# Patient Record
Sex: Female | Born: 1950 | Race: White | Hispanic: No | Marital: Single | Smoking: Never smoker
Health system: Southern US, Community
[De-identification: ages and names within clinical notes are randomized; demographics above are authoritative.]

## PROBLEM LIST (undated history)

## (undated) DIAGNOSIS — K224 Dyskinesia of esophagus: Secondary | ICD-10-CM

## (undated) DIAGNOSIS — N951 Menopausal and female climacteric states: Secondary | ICD-10-CM

## (undated) DIAGNOSIS — F329 Major depressive disorder, single episode, unspecified: Secondary | ICD-10-CM

## (undated) DIAGNOSIS — N3941 Urge incontinence: Secondary | ICD-10-CM

## (undated) DIAGNOSIS — R269 Unspecified abnormalities of gait and mobility: Secondary | ICD-10-CM

## (undated) DIAGNOSIS — F209 Schizophrenia, unspecified: Secondary | ICD-10-CM

## (undated) DIAGNOSIS — F32A Depression, unspecified: Secondary | ICD-10-CM

## (undated) DIAGNOSIS — K649 Unspecified hemorrhoids: Secondary | ICD-10-CM

## (undated) DIAGNOSIS — M76899 Other specified enthesopathies of unspecified lower limb, excluding foot: Secondary | ICD-10-CM

## (undated) DIAGNOSIS — E785 Hyperlipidemia, unspecified: Secondary | ICD-10-CM

## (undated) DIAGNOSIS — F419 Anxiety disorder, unspecified: Secondary | ICD-10-CM

## (undated) DIAGNOSIS — K219 Gastro-esophageal reflux disease without esophagitis: Secondary | ICD-10-CM

## (undated) HISTORY — DX: Menopausal and female climacteric states: N95.1

## (undated) HISTORY — DX: Gastro-esophageal reflux disease without esophagitis: K21.9

## (undated) HISTORY — DX: Unspecified abnormalities of gait and mobility: R26.9

## (undated) HISTORY — DX: Urge incontinence: N39.41

## (undated) HISTORY — DX: Anxiety disorder, unspecified: F41.9

## (undated) HISTORY — DX: Major depressive disorder, single episode, unspecified: F32.9

## (undated) HISTORY — DX: Depression, unspecified: F32.A

## (undated) HISTORY — DX: Hyperlipidemia, unspecified: E78.5

## (undated) HISTORY — DX: Other specified enthesopathies of unspecified lower limb, excluding foot: M76.899

## (undated) HISTORY — DX: Unspecified hemorrhoids: K64.9

## (undated) HISTORY — DX: Dyskinesia of esophagus: K22.4

## (undated) HISTORY — DX: Schizophrenia, unspecified: F20.9

---

## 1997-11-13 ENCOUNTER — Other Ambulatory Visit: Admission: RE | Admit: 1997-11-13 | Discharge: 1997-11-13 | Payer: Self-pay | Admitting: Gynecology

## 1998-05-09 HISTORY — PX: BREAST SURGERY: SHX581

## 1998-12-02 ENCOUNTER — Other Ambulatory Visit: Admission: RE | Admit: 1998-12-02 | Discharge: 1998-12-02 | Payer: Self-pay | Admitting: Gynecology

## 1999-01-13 ENCOUNTER — Other Ambulatory Visit: Admission: RE | Admit: 1999-01-13 | Discharge: 1999-01-13 | Payer: Self-pay | Admitting: Gynecology

## 1999-08-19 ENCOUNTER — Encounter: Payer: Self-pay | Admitting: Gynecology

## 1999-08-19 ENCOUNTER — Encounter: Admission: RE | Admit: 1999-08-19 | Discharge: 1999-08-19 | Payer: Self-pay | Admitting: Gynecology

## 1999-08-31 ENCOUNTER — Encounter: Payer: Self-pay | Admitting: Gynecology

## 1999-08-31 ENCOUNTER — Encounter: Admission: RE | Admit: 1999-08-31 | Discharge: 1999-08-31 | Payer: Self-pay | Admitting: Gynecology

## 1999-12-07 ENCOUNTER — Other Ambulatory Visit: Admission: RE | Admit: 1999-12-07 | Discharge: 1999-12-07 | Payer: Self-pay | Admitting: Gynecology

## 2000-09-28 ENCOUNTER — Encounter: Payer: Self-pay | Admitting: Gynecology

## 2000-09-28 ENCOUNTER — Encounter: Admission: RE | Admit: 2000-09-28 | Discharge: 2000-09-28 | Payer: Self-pay | Admitting: Gynecology

## 2000-10-09 ENCOUNTER — Encounter: Admission: RE | Admit: 2000-10-09 | Discharge: 2000-10-09 | Payer: Self-pay | Admitting: Gynecology

## 2000-10-09 ENCOUNTER — Encounter: Payer: Self-pay | Admitting: Gynecology

## 2000-12-07 ENCOUNTER — Other Ambulatory Visit: Admission: RE | Admit: 2000-12-07 | Discharge: 2000-12-07 | Payer: Self-pay | Admitting: Gynecology

## 2001-10-26 ENCOUNTER — Encounter: Admission: RE | Admit: 2001-10-26 | Discharge: 2001-10-26 | Payer: Self-pay | Admitting: Gynecology

## 2001-10-26 ENCOUNTER — Encounter: Payer: Self-pay | Admitting: Gynecology

## 2001-12-25 ENCOUNTER — Other Ambulatory Visit: Admission: RE | Admit: 2001-12-25 | Discharge: 2001-12-25 | Payer: Self-pay | Admitting: Gynecology

## 2002-11-29 ENCOUNTER — Encounter: Payer: Self-pay | Admitting: Gynecology

## 2002-11-29 ENCOUNTER — Encounter: Admission: RE | Admit: 2002-11-29 | Discharge: 2002-11-29 | Payer: Self-pay | Admitting: Gynecology

## 2003-01-08 ENCOUNTER — Encounter (INDEPENDENT_AMBULATORY_CARE_PROVIDER_SITE_OTHER): Payer: Self-pay | Admitting: *Deleted

## 2003-01-22 ENCOUNTER — Other Ambulatory Visit: Admission: RE | Admit: 2003-01-22 | Discharge: 2003-01-22 | Payer: Self-pay | Admitting: Gynecology

## 2003-11-27 ENCOUNTER — Encounter: Admission: RE | Admit: 2003-11-27 | Discharge: 2003-11-27 | Payer: Self-pay | Admitting: Family Medicine

## 2003-12-03 ENCOUNTER — Encounter: Admission: RE | Admit: 2003-12-03 | Discharge: 2003-12-03 | Payer: Self-pay | Admitting: Family Medicine

## 2003-12-12 ENCOUNTER — Encounter: Admission: RE | Admit: 2003-12-12 | Discharge: 2003-12-12 | Payer: Self-pay | Admitting: Gynecology

## 2003-12-17 ENCOUNTER — Encounter: Admission: RE | Admit: 2003-12-17 | Discharge: 2003-12-17 | Payer: Self-pay | Admitting: Sports Medicine

## 2004-02-25 ENCOUNTER — Ambulatory Visit: Payer: Self-pay | Admitting: Family Medicine

## 2004-03-23 ENCOUNTER — Ambulatory Visit: Payer: Self-pay | Admitting: Family Medicine

## 2004-05-18 ENCOUNTER — Emergency Department (HOSPITAL_COMMUNITY): Admission: EM | Admit: 2004-05-18 | Discharge: 2004-05-18 | Payer: Self-pay | Admitting: *Deleted

## 2004-11-30 ENCOUNTER — Ambulatory Visit: Payer: Self-pay | Admitting: Sports Medicine

## 2004-12-10 ENCOUNTER — Ambulatory Visit: Payer: Self-pay | Admitting: Family Medicine

## 2005-01-03 ENCOUNTER — Ambulatory Visit: Payer: Self-pay | Admitting: Internal Medicine

## 2005-01-17 ENCOUNTER — Ambulatory Visit: Payer: Self-pay | Admitting: Internal Medicine

## 2005-01-17 LAB — HM COLONOSCOPY

## 2005-01-25 ENCOUNTER — Ambulatory Visit: Payer: Self-pay | Admitting: Sports Medicine

## 2005-01-28 ENCOUNTER — Encounter: Admission: RE | Admit: 2005-01-28 | Discharge: 2005-01-28 | Payer: Self-pay | Admitting: Gynecology

## 2005-03-02 ENCOUNTER — Other Ambulatory Visit: Admission: RE | Admit: 2005-03-02 | Discharge: 2005-03-02 | Payer: Self-pay | Admitting: Gynecology

## 2005-03-11 ENCOUNTER — Ambulatory Visit: Payer: Self-pay | Admitting: Family Medicine

## 2005-03-24 ENCOUNTER — Ambulatory Visit: Payer: Self-pay | Admitting: Family Medicine

## 2005-04-22 ENCOUNTER — Ambulatory Visit: Payer: Self-pay | Admitting: Family Medicine

## 2005-05-12 ENCOUNTER — Encounter: Admission: RE | Admit: 2005-05-12 | Discharge: 2005-05-12 | Payer: Self-pay | Admitting: Family Medicine

## 2005-08-29 ENCOUNTER — Ambulatory Visit: Payer: Self-pay | Admitting: Family Medicine

## 2005-12-26 ENCOUNTER — Ambulatory Visit: Payer: Self-pay | Admitting: Family Medicine

## 2005-12-28 ENCOUNTER — Ambulatory Visit: Payer: Self-pay | Admitting: Family Medicine

## 2006-01-04 ENCOUNTER — Ambulatory Visit: Payer: Self-pay | Admitting: Family Medicine

## 2006-02-15 ENCOUNTER — Encounter: Admission: RE | Admit: 2006-02-15 | Discharge: 2006-02-15 | Payer: Self-pay | Admitting: Gynecology

## 2006-07-06 DIAGNOSIS — F209 Schizophrenia, unspecified: Secondary | ICD-10-CM | POA: Insufficient documentation

## 2006-07-06 DIAGNOSIS — N951 Menopausal and female climacteric states: Secondary | ICD-10-CM

## 2006-07-06 DIAGNOSIS — K649 Unspecified hemorrhoids: Secondary | ICD-10-CM | POA: Insufficient documentation

## 2006-07-07 ENCOUNTER — Encounter (INDEPENDENT_AMBULATORY_CARE_PROVIDER_SITE_OTHER): Payer: Self-pay | Admitting: *Deleted

## 2006-08-14 ENCOUNTER — Ambulatory Visit (HOSPITAL_COMMUNITY): Admission: RE | Admit: 2006-08-14 | Discharge: 2006-08-14 | Payer: Self-pay | Admitting: Family Medicine

## 2006-08-14 ENCOUNTER — Ambulatory Visit: Payer: Self-pay | Admitting: Sports Medicine

## 2006-08-22 ENCOUNTER — Ambulatory Visit: Payer: Self-pay | Admitting: Family Medicine

## 2006-08-22 ENCOUNTER — Encounter (INDEPENDENT_AMBULATORY_CARE_PROVIDER_SITE_OTHER): Payer: Self-pay | Admitting: Family Medicine

## 2006-08-22 LAB — CONVERTED CEMR LAB
CO2: 26 meq/L (ref 19–32)
Chloride: 104 meq/L (ref 96–112)
Creatinine, Ser: 1.04 mg/dL (ref 0.40–1.20)
Glucose, Fasting: 93 mg/dL (ref 70–99)
Potassium: 4.2 meq/L (ref 3.5–5.3)

## 2006-08-24 ENCOUNTER — Telehealth: Payer: Self-pay | Admitting: *Deleted

## 2006-09-13 ENCOUNTER — Ambulatory Visit: Payer: Self-pay | Admitting: Family Medicine

## 2006-09-13 DIAGNOSIS — N3941 Urge incontinence: Secondary | ICD-10-CM

## 2006-12-05 ENCOUNTER — Telehealth (INDEPENDENT_AMBULATORY_CARE_PROVIDER_SITE_OTHER): Payer: Self-pay | Admitting: Family Medicine

## 2006-12-25 ENCOUNTER — Encounter (INDEPENDENT_AMBULATORY_CARE_PROVIDER_SITE_OTHER): Payer: Self-pay | Admitting: Family Medicine

## 2007-03-19 ENCOUNTER — Encounter: Admission: RE | Admit: 2007-03-19 | Discharge: 2007-03-19 | Payer: Self-pay | Admitting: Obstetrics and Gynecology

## 2007-04-23 ENCOUNTER — Encounter (INDEPENDENT_AMBULATORY_CARE_PROVIDER_SITE_OTHER): Payer: Self-pay | Admitting: Family Medicine

## 2007-04-23 ENCOUNTER — Ambulatory Visit: Payer: Self-pay | Admitting: Family Medicine

## 2007-04-23 DIAGNOSIS — K224 Dyskinesia of esophagus: Secondary | ICD-10-CM

## 2007-04-24 ENCOUNTER — Encounter (INDEPENDENT_AMBULATORY_CARE_PROVIDER_SITE_OTHER): Payer: Self-pay | Admitting: Family Medicine

## 2007-04-24 LAB — CONVERTED CEMR LAB
ALT: 18 units/L (ref 0–35)
AST: 15 units/L (ref 0–37)
BUN: 22 mg/dL (ref 6–23)
CO2: 25 meq/L (ref 19–32)
Chloride: 105 meq/L (ref 96–112)
Creatinine, Ser: 1.13 mg/dL (ref 0.40–1.20)
Direct LDL: 104 mg/dL — ABNORMAL HIGH
Glucose, Bld: 93 mg/dL (ref 70–99)
Total Bilirubin: 0.3 mg/dL (ref 0.3–1.2)
Total Protein: 6.7 g/dL (ref 6.0–8.3)

## 2007-08-15 ENCOUNTER — Telehealth (INDEPENDENT_AMBULATORY_CARE_PROVIDER_SITE_OTHER): Payer: Self-pay | Admitting: Family Medicine

## 2007-10-16 ENCOUNTER — Telehealth: Payer: Self-pay | Admitting: *Deleted

## 2007-10-18 ENCOUNTER — Ambulatory Visit: Payer: Self-pay | Admitting: Family Medicine

## 2007-10-18 ENCOUNTER — Encounter (INDEPENDENT_AMBULATORY_CARE_PROVIDER_SITE_OTHER): Payer: Self-pay | Admitting: Family Medicine

## 2007-10-18 LAB — CONVERTED CEMR LAB
AST: 21 units/L (ref 0–37)
BUN: 15 mg/dL (ref 6–23)
CO2: 27 meq/L (ref 19–32)
Cholesterol: 178 mg/dL (ref 0–200)
Creatinine, Ser: 0.95 mg/dL (ref 0.40–1.20)
Glucose, Bld: 96 mg/dL (ref 70–99)
HDL: 58 mg/dL (ref >39)
Hemoglobin: 13.8 g/dL (ref 12.0–15.0)
MCHC: 32.9 g/dL (ref 30.0–36.0)
Platelets: 231 10*3/uL (ref 150–400)
TSH: 2.628 microintl units/mL (ref 0.350–5.50)
Total Bilirubin: 0.5 mg/dL (ref 0.3–1.2)
Triglycerides: 246 mg/dL — ABNORMAL HIGH (ref <150)
WBC: 5.6 10*3/uL (ref 4.0–10.5)

## 2007-10-24 ENCOUNTER — Ambulatory Visit: Payer: Self-pay | Admitting: Family Medicine

## 2008-02-05 ENCOUNTER — Telehealth: Payer: Self-pay | Admitting: *Deleted

## 2008-02-13 ENCOUNTER — Ambulatory Visit: Payer: Self-pay | Admitting: Family Medicine

## 2008-02-13 ENCOUNTER — Encounter (INDEPENDENT_AMBULATORY_CARE_PROVIDER_SITE_OTHER): Payer: Self-pay | Admitting: Family Medicine

## 2008-02-13 DIAGNOSIS — R269 Unspecified abnormalities of gait and mobility: Secondary | ICD-10-CM | POA: Insufficient documentation

## 2008-02-27 ENCOUNTER — Encounter: Admission: RE | Admit: 2008-02-27 | Discharge: 2008-03-28 | Payer: Self-pay | Admitting: Family Medicine

## 2008-03-26 ENCOUNTER — Encounter: Admission: RE | Admit: 2008-03-26 | Discharge: 2008-03-26 | Payer: Self-pay | Admitting: Family Medicine

## 2008-05-19 ENCOUNTER — Encounter (INDEPENDENT_AMBULATORY_CARE_PROVIDER_SITE_OTHER): Payer: Self-pay | Admitting: *Deleted

## 2008-05-19 ENCOUNTER — Ambulatory Visit: Payer: Self-pay | Admitting: Family Medicine

## 2008-05-21 ENCOUNTER — Encounter (INDEPENDENT_AMBULATORY_CARE_PROVIDER_SITE_OTHER): Payer: Self-pay | Admitting: Family Medicine

## 2008-05-21 ENCOUNTER — Ambulatory Visit: Payer: Self-pay | Admitting: Family Medicine

## 2008-05-28 ENCOUNTER — Encounter (INDEPENDENT_AMBULATORY_CARE_PROVIDER_SITE_OTHER): Payer: Self-pay | Admitting: Family Medicine

## 2008-05-28 LAB — CONVERTED CEMR LAB
ALT: 21 units/L (ref 0–35)
AST: 19 units/L (ref 0–37)
Albumin: 4.5 g/dL (ref 3.5–5.2)
BUN: 18 mg/dL (ref 6–23)
Basophils Relative: 0 % (ref 0–1)
Chloride: 101 meq/L (ref 96–112)
Eosinophils Absolute: 0 10*3/uL (ref 0.0–0.7)
Ferritin: 50 ng/mL (ref 10–291)
Folate: >20 ng/mL
Glucose, Bld: 91 mg/dL (ref 70–99)
Monocytes Absolute: 0.3 10*3/uL (ref 0.1–1.0)
Neutro Abs: 3.9 10*3/uL (ref 1.7–7.7)
Neutrophils Relative %: 72 % (ref 43–77)
Potassium: 4.2 meq/L (ref 3.5–5.3)
RBC: 4.61 M/uL (ref 3.87–5.11)
RDW: 13.8 % (ref 11.5–15.5)
TSH: 4.006 microintl units/mL (ref 0.350–4.50)
Total Bilirubin: 0.4 mg/dL (ref 0.3–1.2)
Total Protein: 6.8 g/dL (ref 6.0–8.3)
Vit D, 1,25-Dihydroxy: 48 (ref 30–89)
Vitamin B-12: 705 pg/mL (ref 211–911)

## 2008-06-24 ENCOUNTER — Encounter (INDEPENDENT_AMBULATORY_CARE_PROVIDER_SITE_OTHER): Payer: Self-pay | Admitting: Family Medicine

## 2008-07-23 ENCOUNTER — Encounter (INDEPENDENT_AMBULATORY_CARE_PROVIDER_SITE_OTHER): Payer: Self-pay | Admitting: Family Medicine

## 2008-07-23 ENCOUNTER — Ambulatory Visit: Payer: Self-pay | Admitting: Family Medicine

## 2008-07-24 ENCOUNTER — Encounter (INDEPENDENT_AMBULATORY_CARE_PROVIDER_SITE_OTHER): Payer: Self-pay | Admitting: Family Medicine

## 2008-10-15 ENCOUNTER — Ambulatory Visit: Payer: Self-pay | Admitting: Family Medicine

## 2008-10-15 DIAGNOSIS — E663 Overweight: Secondary | ICD-10-CM | POA: Insufficient documentation

## 2008-10-21 ENCOUNTER — Encounter (INDEPENDENT_AMBULATORY_CARE_PROVIDER_SITE_OTHER): Payer: Self-pay | Admitting: Family Medicine

## 2008-10-21 ENCOUNTER — Ambulatory Visit: Payer: Self-pay | Admitting: Family Medicine

## 2008-10-22 ENCOUNTER — Encounter: Payer: Self-pay | Admitting: Family Medicine

## 2008-10-22 LAB — CONVERTED CEMR LAB
ALT: 37 units/L — ABNORMAL HIGH (ref 0–35)
AST: 27 units/L (ref 0–37)
CO2: 25 meq/L (ref 19–32)
Chloride: 105 meq/L (ref 96–112)
Cholesterol: 218 mg/dL — ABNORMAL HIGH (ref 0–200)
Creatinine, Ser: 1.02 mg/dL (ref 0.40–1.20)
Glucose, Bld: 91 mg/dL (ref 70–99)
HDL: 50 mg/dL (ref >39)
Hemoglobin: 13.5 g/dL (ref 12.0–15.0)
MCHC: 32 g/dL (ref 30.0–36.0)
Platelets: 203 10*3/uL (ref 150–400)
Total Bilirubin: 0.5 mg/dL (ref 0.3–1.2)
Triglycerides: 307 mg/dL — ABNORMAL HIGH (ref <150)
VLDL: 61 mg/dL — ABNORMAL HIGH (ref 0–40)

## 2008-10-28 ENCOUNTER — Telehealth: Payer: Self-pay | Admitting: Family Medicine

## 2008-10-29 ENCOUNTER — Ambulatory Visit: Payer: Self-pay | Admitting: Family Medicine

## 2008-10-29 DIAGNOSIS — H6120 Impacted cerumen, unspecified ear: Secondary | ICD-10-CM

## 2008-12-01 ENCOUNTER — Telehealth: Payer: Self-pay | Admitting: *Deleted

## 2008-12-04 ENCOUNTER — Encounter: Payer: Self-pay | Admitting: Family Medicine

## 2009-01-20 ENCOUNTER — Ambulatory Visit: Payer: Self-pay | Admitting: Internal Medicine

## 2009-01-20 DIAGNOSIS — K219 Gastro-esophageal reflux disease without esophagitis: Secondary | ICD-10-CM

## 2009-01-20 DIAGNOSIS — M79609 Pain in unspecified limb: Secondary | ICD-10-CM

## 2009-01-20 DIAGNOSIS — E785 Hyperlipidemia, unspecified: Secondary | ICD-10-CM

## 2009-03-30 ENCOUNTER — Encounter: Admission: RE | Admit: 2009-03-30 | Discharge: 2009-03-30 | Payer: Self-pay | Admitting: Obstetrics and Gynecology

## 2009-05-27 ENCOUNTER — Ambulatory Visit: Payer: Self-pay | Admitting: Internal Medicine

## 2009-05-27 LAB — CONVERTED CEMR LAB
Direct LDL: 83.4 mg/dL
Total Bilirubin: 0.7 mg/dL (ref 0.3–1.2)
Total Protein: 6.5 g/dL (ref 6.0–8.3)

## 2009-07-14 ENCOUNTER — Telehealth: Payer: Self-pay | Admitting: Internal Medicine

## 2009-08-06 ENCOUNTER — Telehealth: Payer: Self-pay | Admitting: Internal Medicine

## 2009-12-14 ENCOUNTER — Encounter (INDEPENDENT_AMBULATORY_CARE_PROVIDER_SITE_OTHER): Payer: Self-pay | Admitting: *Deleted

## 2009-12-22 ENCOUNTER — Ambulatory Visit: Payer: Self-pay | Admitting: Internal Medicine

## 2009-12-22 LAB — CONVERTED CEMR LAB
ALT: 29 units/L (ref 0–35)
Albumin: 4.1 g/dL (ref 3.5–5.2)
Alkaline Phosphatase: 93 units/L (ref 39–117)
Cholesterol: 164 mg/dL (ref 0–200)
Direct LDL: 84.9 mg/dL
Total Bilirubin: 0.5 mg/dL (ref 0.3–1.2)
Total CHOL/HDL Ratio: 3
Triglycerides: 212 mg/dL — ABNORMAL HIGH (ref 0.0–149.0)

## 2009-12-23 ENCOUNTER — Encounter (INDEPENDENT_AMBULATORY_CARE_PROVIDER_SITE_OTHER): Payer: Self-pay | Admitting: *Deleted

## 2010-02-26 ENCOUNTER — Encounter (INDEPENDENT_AMBULATORY_CARE_PROVIDER_SITE_OTHER): Payer: Self-pay

## 2010-03-02 ENCOUNTER — Ambulatory Visit: Payer: Self-pay | Admitting: Internal Medicine

## 2010-03-16 ENCOUNTER — Ambulatory Visit: Payer: Self-pay | Admitting: Internal Medicine

## 2010-03-16 HISTORY — PX: COLONOSCOPY: SHX174

## 2010-03-20 ENCOUNTER — Encounter: Payer: Self-pay | Admitting: Internal Medicine

## 2010-04-13 ENCOUNTER — Encounter: Admission: RE | Admit: 2010-04-13 | Discharge: 2010-04-13 | Payer: Self-pay | Admitting: Obstetrics and Gynecology

## 2010-06-10 NOTE — Progress Notes (Signed)
Summary: simvastatin  Phone Note Refill Request Message from:  Fax from Pharmacy on August 06, 2009 2:20 PM  Refills Requested: Medication #1:  SIMVASTATIN 40 MG TABS 1 by mouth at bedtime   Last Refilled: 06/29/2009  Method Requested: Electronic Initial call taken by: Orlan Leavens,  August 06, 2009 2:20 PM    Prescriptions: SIMVASTATIN 40 MG TABS (SIMVASTATIN) 1 by mouth at bedtime  #30 x 5   Entered by:   Orlan Leavens   Authorized by:   Newt Lukes MD   Signed by:   Orlan Leavens on 08/06/2009   Method used:   Electronically to        [facility] (retail)       [address]       [place], [place]  [address]       Ph: [phone] or [phone]       Fax: [phone]   RxID:   [id number]

## 2010-06-10 NOTE — Progress Notes (Signed)
Summary: cartia  Phone Note Refill Request Message from:  Fax from Pharmacy on July 14, 2009 8:56 AM  Refills Requested: Medication #1:  DILTIAZEM HCL COATED BEADS 120 MG CP24 1 capsule by mouth daily   Last Refilled: 06/04/2009  Method Requested: Electronic Initial call taken by: Orlan Leavens,  July 14, 2009 8:57 AM    Prescriptions: DILTIAZEM HCL COATED BEADS 120 MG CP24 (DILTIAZEM HCL COATED BEADS) 1 capsule by mouth daily  #30 Capsule x 5   Entered by:   Orlan Leavens   Authorized by:   Newt Lukes MD   Signed by:   Orlan Leavens on 07/14/2009   Method used:   Electronically to        [facility] (retail)       [address]       [place], [place]  [address]       Ph: [phone] or [phone]       Fax: [phone]   RxID:   [id number]

## 2010-06-10 NOTE — Letter (Signed)
Summary: Patient Notice- Polyp Results  [facility]  [address] [place], [place] [address]   Phone: [phone]  Fax: [phone]        March 20, 2010 MRN: [id number]    Nancy Brooks [address] [place], [place]  [address]    Dear Ms. Shaff,  I am pleased to inform you that the colon polyp(s) removed during your recent colonoscopy was (were) found to be benign (no cancer detected) upon pathologic examination.The polyps did not contain any precancerous tissue  I recommend you have a repeat colonoscopy examination in 5_ years to look for recurrent polyps, as having colon polyps increases your risk for having recurrent polyps or even colon cancer in the future.  Should you develop new or worsening symptoms of abdominal pain, bowel habit changes or bleeding from the rectum or bowels, please schedule an evaluation with either your primary care physician or with me.  Additional information/recommendations:  __ No further action with gastroenterology is needed at this time. Please      follow-up with your primary care physician for your other healthcare      needs.  __ Please call [phone] to schedule a return visit to review your      situation.  __ Please keep your follow-up visit as already scheduled.  __ Continue treatment plan as outlined the day of your exam.  Please call us if you are having persistent problems or have questions about your condition that have not been fully answered at this time.  Sincerely,  Hart Carwin MD  This letter has been electronically signed by your physician.  Appended Document: Patient Notice- Polyp Results Letter mailed

## 2010-06-10 NOTE — Procedures (Signed)
Summary: Colonoscopy  Patient: Nancy Brooks Note: All result statuses are Final unless otherwise noted.  Tests: (1) Colonoscopy (COL)   COL Colonoscopy           DONE     [facility]     [address]     [place], [place]  [address]           COLONOSCOPY PROCEDURE REPORT           PATIENT:  Nancy Brooks, Nancy Brooks  MR#:  [id number]     BIRTHDATE:  07-09-1950, 59 yrs. old  GENDER:  female     ENDOSCOPIST:  Hedwig Morton. Juanda Chance, MD     REF. BY:     PROCEDURE DATE:  03/16/2010     PROCEDURE:  Colonoscopy 13086     ASA CLASS:  Class I     INDICATIONS:  family history of colon cancer brother with colon     cancer     last colon 2006     MEDICATIONS:   Versed 9 mg, Fentanyl 75 mcg           DESCRIPTION OF PROCEDURE:   After the risks benefits and     alternatives of the procedure were thoroughly explained, informed     consent was obtained.  Digital rectal exam was performed and     revealed no rectal masses.   The LB [id number]-[id number] endoscope     was introduced through the anus and advanced to the cecum, which     was identified by both the appendix and ileocecal valve, without     limitations.  The quality of the prep was good, using MiraLax.     The instrument was then slowly withdrawn as the colon was fully     examined.     <<PROCEDUREIMAGES>>           FINDINGS:  Two polyps were found. at 90 cm flat 5 mm polyp, at     20cm 8 mm flat polyp The polyp was removed using cold biopsy     forceps. Polyp was snared without cautery. Retrieval was     unsuccessful (see image1 and image2). snare polyp  This was     otherwise a normal examination of the colon (see image6, image4,     and image3).  Scattered diverticula were found in the sigmoid     colon (see image5).   Retroflexed views in the rectum revealed no     abnormalities.    The scope was then withdrawn from the patient     and the procedure completed.           COMPLICATIONS:  None     ENDOSCOPIC IMPRESSION:     1) Two  polyps     2) Otherwise normal examination     3) Diverticula, scattered in the sigmoid colon     RECOMMENDATIONS:     1) Await pathology results     2) High fiber diet.     REPEAT EXAM:  In 5 year(s) for.           ______________________________     Hedwig Morton. Juanda Chance, MD           CC:  Newt Lukes, MD           n.     Rosalie DoctorHedwig Morton. Brodie at 03/16/2010 02:41 PM           Bea Laura, [phone]  Note:  An exclamation mark (!) indicates a result that was not dispersed into the flowsheet. Document Creation Date: 03/16/2010 2:41 PM _______________________________________________________________________  (1) Order result status: Final Collection or observation date-time: 03/16/2010 14:34 Requested date-time:  Receipt date-time:  Reported date-time:  Referring Physician:   Ordering Physician: Lina Sar [phone]) Specimen Source:  Source: Launa Grill Order Number: [id number] Lab site:   Appended Document: Colonoscopy     Procedures Next Due Date:    Colonoscopy: 03/2015

## 2010-06-10 NOTE — Letter (Signed)
Summary: Colonoscopy Letter  [facility]  [address] [place], [place] [address]   Phone: [phone]  Fax: [phone]      December 14, 2009 MRN: [id number]   Nancy Brooks [address] [place], [place]  [address]   Dear Ms. Sparrow,   According to your medical record, it is time for you to schedule a Colonoscopy. The American Cancer Society recommends this procedure as a method to detect early colon cancer. Patients with a family history of colon cancer, or a personal history of colon polyps or inflammatory bowel disease are at increased risk.  This letter has been generated based on the recommendations made at the time of your procedure. If you feel that in your particular situation this may no longer apply, please contact our office.  Please call our office at [phone] to schedule this appointment or to update your records at your earliest convenience.  Thank you for cooperating with [place] to provide you with the very best care possible.   Sincerely,  Hedwig Morton. Juanda Chance, M.D.  [facility] Gastroenterology Division [phone]

## 2010-06-10 NOTE — Assessment & Plan Note (Signed)
Summary: 6 mos f/u #/cd   Vital Signs:  Patient profile:   60 year old female Height:      60.5 inches (153.67 cm) Weight:      148.8 pounds (67.64 kg) BMI:     28.69 O2 Sat:      98 % on Room air Temp:     98.2 degrees F (36.78 degrees C) oral Pulse rate:   115 / minute BP sitting:   120 / 78  (left arm) Cuff size:   regular  Vitals Entered By: Orlan Leavens RMA (December 22, 2009 11:08 AM)  O2 Flow:  Room air CC: 6 month follow-up Is Patient Diabetic? No Pain Assessment Patient in pain? no        Primary Care Arriana Lohmann:  Newt Lukes MD  CC:  6 month follow-up.  History of Present Illness: here for followup here with her sister today, [name]  schizophrenia - follows with psyc - dr. Jennelle Human visits q 6-8 weeks for same symptoms have been well-controlled.  No recent mood changes. No hallucinations.  No recent med changes  GERD with hx esoph spasm   on Dilt for same since 05/2005 no daily reflux or burning symptoms  dyslipidemia. Has been on increased dose of simvastatin since 01/2009 No GI or muscle complaints at current dose also takes fish oil capsules  Obesity. Patient reports she is working on diet to lose weight, stll exercising She is uncertain of recent weight changes.    Clinical Review Panels:  Prevention   Last Mammogram:  ASSESSMENT: Negative - BI-RADS 1^MM DIGITAL SCREENING (03/26/2008)   Last Pap Smear:  normal (02/07/2007)   Last Colonoscopy:  Results: Hemorrhoids,internal Comments: Poor prep prevented detection of small polyps, but no gross lesion     Location:  [facility].  (01/17/2005)  Lipid Management   Cholesterol:  165 (05/27/2009)   LDL (bad choesterol):  107 (10/21/2008)   HDL (good cholesterol):  55.30 (05/27/2009)  CBC   WBC:  5.6 (10/21/2008)   RBC:  4.69 (10/21/2008)   Hgb:  13.5 (10/21/2008)   Hct:  42.2 (10/21/2008)   Platelets:  203 (10/21/2008)   MCV  90.0 (10/21/2008)   MCHC  32.0 (10/21/2008)   RDW   14.1 (10/21/2008)   PMN:  72 (05/21/2008)   Lymphs:  22 (05/21/2008)   Monos:  6 (05/21/2008)   Eosinophils:  0 (05/21/2008)   Basophil:  0 (05/21/2008)  Complete Metabolic Panel   Glucose:  91 (10/21/2008)   Sodium:  143 (10/21/2008)   Potassium:  4.2 (10/21/2008)   Chloride:  105 (10/21/2008)   CO2:  25 (10/21/2008)   BUN:  16 (10/21/2008)   Creatinine:  1.02 (10/21/2008)   Albumin:  4.1 (05/27/2009)   Total Protein:  6.5 (05/27/2009)   Calcium:  10.0 (10/21/2008)   Total Bili:  0.7 (05/27/2009)   Alk Phos:  102 (05/27/2009)   SGPT (ALT):  31 (05/27/2009)   SGOT (AST):  27 (05/27/2009)   Current Medications (verified): 1)  Simvastatin 40 Mg Tabs (Simvastatin) .[name].. 1 By Mouth At Bedtime 2)  Diltiazem Hcl Coated Beads 120 Mg Cp24 (Diltiazem Hcl Coated Beads) .[name].. 1 Capsule By Mouth Daily 3)  Clozapine 100 Mg  Tabs (Clozapine) .... Per Psychiatrist - Dose Unknown 4)  Librium 10 Mg  Caps (Chlordiazepoxide Hcl) .... Per Psychiatrist - Dose Unknown 5)  Tums Ultra 1000 1000 Mg  Chew (Calcium Carbonate Antacid) 6)  Multivitamins   Tabs (Multiple Vitamin) .[name].. 1 By  Mouth Daily 7)  Miralax   Powd (Polyethylene Glycol 3350) .... As Needed 8)  Adult Aspirin Low Strength 81 Mg  Tbdp (Aspirin) 9)  Fish Oil 1000 Mg Caps (Omega-3 Fatty Acids) .... Take 3 By Mouth Qd  Allergies (verified): No Known Drug Allergies  Past History:  Past Medical History: Esophageal dysmotility (April 2007-barium swallow) GERD Menopausal since  ~ 60 yo Schizophrenia - well-controlled and stable on Clozapine and Librium for years.   Hyperlipidemia  Review of Systems  The patient denies fever, weight gain, chest pain, syncope, and headaches.    Physical Exam  General:  alert, well-developed, well-nourished, and cooperative to examination.   sister [name] at side Lungs:  normal respiratory effort, no intercostal retractions or use of accessory muscles; normal breath sounds bilaterally - no crackles and  no wheezes.    Heart:  normal rate, regular rhythm, no murmur, and no rub. BLE without edema. Abdomen:  obese, soft, non-tender, normal bowel sounds, no distention; no masses and no appreciable hepatomegaly or splenomegaly.   Psych:  Mildly anxious.  Requires some simplification of responses and repetition at times, but fairly stable and able to communicate effectively. Oriented X3, memory intact for recent and remote, not depressed appearing, and not agitated.     Impression & Recommendations:  Problem # 1:  HYPERLIPIDEMIA (ICD-272.4)  Her updated medication list for this problem includes:    Simvastatin 40 Mg Tabs (Simvastatin) .[name]... 1 by mouth at bedtime  Orders: TLB-Lipid Panel (80061-LIPID)  check FLP and LFTs today - began higher dose statin 01/2009,  will consider change to prava or other statin if still not controlled  Labs Reviewed: SGOT: 27 (05/27/2009)   SGPT: 31 (05/27/2009)   HDL:55.30 (05/27/2009), 50 (10/21/2008)  LDL:107 (10/21/2008), 71 (36/64/4034)  Chol:165 (05/27/2009), 218 (10/21/2008)  Trig:248.0 (05/27/2009), 307 (10/21/2008)  Problem # 2:  SCHIZOPHRENIA (ICD-295.90)  chronic symptoms are well-controlled. Continue current medications and psych followup with Dr. 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 Continue current  treatment  Labs Reviewed: Hgb: 13.5 (10/21/2008)   Hct: 42.2 (10/21/2008)  Complete Medication List: 1)  Simvastatin 40 Mg Tabs (Simvastatin) .[name].. 1 by mouth at bedtime 2)  Diltiazem Hcl Coated Beads 120 Mg Cp24 (Diltiazem hcl coated beads) .[name].. 1 capsule by mouth daily 3)  Clozapine 100 Mg Tabs (Clozapine) .... Per psychiatrist - dose unknown 4)  Librium 10 Mg Caps (Chlordiazepoxide hcl) .... Per psychiatrist - dose unknown 5)  Tums Ultra 1000 1000 Mg Chew (Calcium carbonate antacid) 6)  Multivitamins Tabs (Multiple vitamin) .[name].. 1 by mouth daily 7)  Miralax Powd (Polyethylene glycol 3350) .... As needed 8)  Adult Aspirin Low Strength 81 Mg Tbdp (Aspirin) 9)  Fish Oil 1000 Mg Caps (Omega-3 fatty acids) .... Take 3 by mouth qd  Other Orders: TLB-Hepatic/Liver Function  Pnl (80076-HEPATIC)  Patient Instructions: 1)  it was good to see you today.  2)  test(s) ordered today - your results will be posted on the phone tree for review in 48-72 hours from the time of test completion; call [phone] and enter your 9 digit MRN (listed above on this page, just below your name); if any changes need to be made or there are abnormal results, you will be contacted directly (at [phone] janet's cell).  3)  good job on the weight loss! keep up the good work! 4)  Please schedule a follow-up appointment in 6 months, sooner if problems.

## 2010-06-10 NOTE — Letter (Signed)
Summary: Pre Visit Letter Revised  [facility]  [address] [place], [place] [address]   Phone: [phone]  Fax: [phone]    12/23/2009 MRN: [id number]    [facility] [address] [place], [place]  [address]                 Procedure Date: February 25, 2010   Welcome to the Gastroenterology Division at [facility].    You are scheduled to see a nurse for your pre-procedure visit on February 11, 2010 at 11:00am on the 3rd floor at [facility], [address]  We ask that you try to arrive at our office 15 minutes prior to your appointment time to allow for check-in.  Please take a minute to review the attached form.  If you answer "Yes" to one or more of the questions on the first page, we ask that you call the person listed at your earliest opportunity.  If you answer "No" to all of the questions, please complete the rest of the form and bring it to your appointment.    Your nurse visit will consist of discussing your medical and surgical history, your immediate family medical history, and your medications.    If you are unable to list all of your medications on the form, please bring the medication bottles to your appointment and we will list them.  We will need to be aware of both prescribed and over the counter drugs.  We will need to know exact dosage information as well.    Please be prepared to read and sign documents such as consent forms, a financial agreement, and acknowledgement forms.  If necessary, and with your consent, a friend or relative is welcome to sit-in on the nurse visit with you.  Please bring your insurance card so that we may make a copy of it.  If your insurance requires a referral to see a specialist, please bring your referral form from your primary care physician.  No co-pay is required for this nurse visit.     If you cannot keep your appointment, please call [phone] to cancel or reschedule prior to your  appointment date.  This allows [place] the opportunity to schedule an appointment for another patient in need of care.   Thank you for choosing [facility] for your medical needs.  We appreciate the opportunity to care for you.  Please visit [place] at our website  to learn more about our practice.                     Sincerely,  The Gastroenterology Division

## 2010-06-10 NOTE — Miscellaneous (Signed)
Summary: Lec previsit  Clinical Lists Changes  Observations: Added new observation of NKA: T (03/02/2010 14:01)  Appended Document: Lec previsit Patient came into the office today (with her sister)  for the previsit prior to her colonoscopy on 03/16/10. The sister was asking about a different prep. The records showed the pt had a poor bowel prep with her last colonoscopy on 01/17/05. Do you want the pt to take the Moviprep or Miralax or does she need a 2 day prep? Please advise. Thanks.  Appended Document: Lec previsit 2 day prep, please.  Appended Document: Lec previsit Dr. Juanda Chance, Could you please specify what you would like the patient to do for a 2 day prep. Thanks.   Appended Document: Lec previsit firtst day full liquids and bottle of Mag citragte, second day Movie prep. ( clear liquids)  Appended Document: Lec previsit Talked with pt's sister; gave instructions for Magnesium citrate and liquids 2 days before procedure.  already had instructions for moviprep day before procedure.  Called in Rx for Moviprep to [facility] on [address].

## 2010-06-10 NOTE — Assessment & Plan Note (Signed)
Summary: 4 MO ROV / # Natale Milch   Vital Signs:  Patient profile:   60 year old female Height:      60.5 inches (153.67 cm) Weight:      152.0 pounds (69.09 kg) O2 Sat:      94 % on Room air Temp:     97.2 degrees F (36.22 degrees C) oral Pulse rate:   101 / minute BP sitting:   124 / 82  (left arm) Cuff size:   regular  Vitals Entered By: Orlan Leavens (May 27, 2009 11:01 AM)  O2 Flow:  Room air CC: 4 month follow-up Is Patient Diabetic? No Pain Assessment Patient in pain? no        Primary Care Provider:  Newt Lukes MD  CC:  4 month follow-up.  History of Present Illness: here for followup here with her sister today, [name]  schizophrenia - follows with psyc - dr. Jennelle Human visits q 6-8 weeks for same symptoms have been well-controlled.  No recent mood changes. No hallucinations.  No recent med changes  GERD with hx esoph spasm   on Dilt for same since 05/2005 no daily reflux or burning symptoms  dyslipidemia. Has been on increased dose of simvastatin since last OV No GI or muscle complaints at current dose Does also take fish oil capsules  Obesity. Patient reports she is no longer working on  diet to lose weight, stll exercising She is uncertain of recent weight changes.    Current Medications (verified): 1)  Simvastatin 40 Mg Tabs (Simvastatin) .[name].. 1 By Mouth At Bedtime 2)  Diltiazem Hcl Coated Beads 120 Mg Cp24 (Diltiazem Hcl Coated Beads) .[name].. 1 Capsule By Mouth Daily 3)  Clozapine 100 Mg  Tabs (Clozapine) .... Per Psychiatrist - Dose Unknown 4)  Librium 10 Mg  Caps (Chlordiazepoxide Hcl) .... Per Psychiatrist - Dose Unknown 5)  Tums Ultra 1000 1000 Mg  Chew (Calcium Carbonate Antacid) 6)  Multivitamins   Tabs (Multiple Vitamin) .[name].. 1 By Mouth Daily 7)  Miralax   Powd (Polyethylene Glycol 3350) .... As Needed 8)  Adult Aspirin Low Strength 81 Mg  Tbdp (Aspirin) 9)  Fish Oil 1000 Mg Caps (Omega-3 Fatty Acids) .... Take 3 By Mouth Qd  Allergies  (verified): No Known Drug Allergies  Past History:  Past Medical History: Last updated: 01/20/2009 Esophageal dysmotility (April 2007-barium swallow) GERD Menopausal since  ~ 60 yo Schizophrenia - well-controlled and stable on Clozapine and Librium for years.   Hyperlipidemia  Review of Systems  The patient denies chest pain, syncope, headaches, and abdominal pain.    Physical Exam  General:  alert, well-developed, well-nourished, and cooperative to examination.   sister [name] at side Lungs:  normal respiratory effort, no intercostal retractions or use of accessory muscles; normal breath sounds bilaterally - no crackles and no wheezes.    Heart:  normal rate, regular rhythm, no murmur, and no rub. BLE without edema. normal DP pulses and normal cap refill in all 4 extremities    Abdomen:  obese, soft, non-tender, normal bowel sounds, no distention; no masses and no appreciable hepatomegaly or splenomegaly.   Psych:  Mildly anxious.  Requires some simplification of responses and repetition at times, but fairly stable and able to communicate effectively. Oriented X3, memory intact for recent and remote, not depressed appearing, and not agitated.     Impression & Recommendations:  Problem # 1:  HYPERLIPIDEMIA (ICD-272.4) check FLP and LFTs today - if TG controlled with higher dose  statin, sister/pt intrested in stopping fish oil - will see once labs back cont same for now Her updated medication list for this problem includes:    Simvastatin 40 Mg Tabs (Simvastatin) .[name]... 1 by mouth at bedtime  Orders: TLB-Lipid Panel (80061-LIPID)  Labs Reviewed: SGOT: 27 (10/21/2008)   SGPT: 37 (10/21/2008)   HDL:50 (10/21/2008), 58 (10/18/2007)  LDL:107 (10/21/2008), 71 (04/54/0981)  Chol:218 (10/21/2008), 178 (10/18/2007)  Trig:307 (10/21/2008), 246 (10/18/2007)  Problem # 2:  SCHIZOPHRENIA (ICD-295.90)  chronic symptoms are well-controlled. Continue current medications and psych followup  with Dr. 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 Continue current treatment Her updated medication list for this problem includes:    Tums Ultra 1000 1000 Mg Chew (Calcium carbonate antacid)  Complete Medication List: 1)  Simvastatin 40 Mg Tabs (Simvastatin) .[name].. 1 by mouth at bedtime 2)  Diltiazem Hcl Coated Beads 120 Mg Cp24 (Diltiazem hcl coated beads) .[name].. 1 capsule by mouth daily 3)  Clozapine 100 Mg Tabs (Clozapine) .... Per psychiatrist - dose unknown 4)  Librium 10 Mg Caps (Chlordiazepoxide hcl) .... Per psychiatrist - dose unknown 5)  Tums Ultra 1000 1000 Mg Chew (Calcium carbonate antacid) 6)  Multivitamins Tabs (Multiple vitamin) .[name].. 1 by mouth daily 7)  Miralax Powd (Polyethylene glycol 3350) .... As needed 8)  Adult Aspirin Low Strength 81 Mg Tbdp (Aspirin) 9)  Fish Oil 1000 Mg Caps (Omega-3 fatty acids) .... Take 3 by mouth qd  Other Orders: TLB-Hepatic/Liver Function Pnl (80076-HEPATIC)  Patient Instructions: 1)  it was good to see you today.  2)  test(s) ordered today - your results will be posted on the phone tree for review in 48-72 hours from the time of test completion; call [phone] and enter your 9 digit MRN (listed above on this page, just below your name); if any changes need to be made or there are abnormal results, you will be contacted directly.  3)  Please schedule a follow-up appointment in 6 months, sooner if problems.    Orders Added: 1)  TLB-Lipid Panel [[id number]] 2)  TLB-Hepatic/Liver Function Pnl [[id number]] 3)  Est.  Patient Level IV [21308]    Mammogram  Procedure date:  03/26/2008  Findings:      No specific mammographic evidence of malignancy.

## 2010-06-10 NOTE — Letter (Signed)
Summary: [facility] Instructions  [facility]  [address] [place], [place] [address]   Phone: [phone]  Fax: [phone]       Nancy Brooks    May 18, 1950    MRN: [id number]        Procedure Day /Date: Tuesday 03-16-10     Arrival Time: 1:00 p.m.      Procedure Time: 2:00 p.m.     Location of Procedure:                    _x_  [facility] (4th Floor)   PREPARATION FOR COLONOSCOPY WITH MOVIPREP   Starting 5 days prior to your procedure  03-11-10 do not eat nuts, seeds, popcorn, corn, beans, peas,  salads, or any raw vegetables.  Do not take any fiber supplements (e.g. Metamucil, Citrucel, and Benefiber).  THE DAY BEFORE YOUR PROCEDURE         DATE:  03-15-10   DAY:  Monday  1.  Drink clear liquids the entire day-NO SOLID FOOD  2.  Do not drink anything colored red or purple.  Avoid juices with pulp.  No orange juice.  3.  Drink at least 64 oz. (8 glasses) of fluid/clear liquids during the day to prevent dehydration and help the prep work efficiently.  CLEAR LIQUIDS INCLUDE: Water Jello Ice Popsicles Tea (sugar ok, no milk/cream) Powdered fruit flavored drinks Coffee (sugar ok, no milk/cream) Gatorade Juice: apple, white grape, white cranberry  Lemonade Clear bullion, consomm, broth Carbonated beverages (any kind) Strained chicken noodle soup Hard Candy                             4.  In the morning, mix first dose of MoviPrep solution:    Empty 1 Pouch A and 1 Pouch B into the disposable container    Add lukewarm drinking water to the top line of the container. Mix to dissolve    Refrigerate (mixed solution should be used within 24 hrs)  5.  Begin drinking the prep at 5:00 p.m. The MoviPrep container is divided by 4 marks.   Every 15 minutes drink the solution down to the next mark (approximately 8 oz) until the full liter is complete.   6.  Follow completed prep with 16 oz of clear liquid of your choice (Nothing red or  purple).  Continue to drink clear liquids until bedtime.  7.  Before going to bed, mix second dose of MoviPrep solution:    Empty 1 Pouch A and 1 Pouch B into the disposable container    Add lukewarm drinking water to the top line of the container. Mix to dissolve    Refrigerate  THE DAY OF YOUR PROCEDURE      DATE:  03-16-10  DAY:  Tuesday  Beginning at  9:00 a.m. (5 hours before procedure):         1. Every 15 minutes, drink the solution down to the next mark (approx 8 oz) until the full liter is complete.  2. Follow completed prep with 16 oz. of clear liquid of your choice.    3. You may drink clear liquids until  12:00 pm (2 HOURS BEFORE PROCEDURE).   MEDICATION INSTRUCTIONS  Unless otherwise instructed, you should take regular prescription medications with a small sip of water   as early as possible the morning of your procedure.         OTHER INSTRUCTIONS  You will need a responsible adult at least 58 years of age to accompany you and drive you home.   This person must remain in the waiting room during your procedure.  Wear loose fitting clothing that is easily removed.  Leave jewelry and other valuables at home.  However, you may wish to bring a book to read or  an iPod/MP3 player to listen to music as you wait for your procedure to start.  Remove all body piercing jewelry and leave at home.  Total time from sign-in until discharge is approximately 2-3 hours.  You should go home directly after your procedure and rest.  You can resume normal activities the  day after your procedure.  The day of your procedure you should not:   Drive   Make legal decisions   Operate machinery   Drink alcohol   Return to work  You will receive specific instructions about eating, activities and medications before you leave.    The above instructions have been reviewed and explained to me by   Ulis Rias RN  March 02, 2010 2:28 PM     I fully understand and  can verbalize these instructions _____________________________ Date _________

## 2010-07-14 ENCOUNTER — Ambulatory Visit: Payer: Self-pay | Admitting: Internal Medicine

## 2010-07-26 ENCOUNTER — Ambulatory Visit (INDEPENDENT_AMBULATORY_CARE_PROVIDER_SITE_OTHER): Payer: 59 | Admitting: Internal Medicine

## 2010-07-26 ENCOUNTER — Encounter: Payer: Self-pay | Admitting: Internal Medicine

## 2010-07-26 DIAGNOSIS — F209 Schizophrenia, unspecified: Secondary | ICD-10-CM

## 2010-07-26 DIAGNOSIS — M76899 Other specified enthesopathies of unspecified lower limb, excluding foot: Secondary | ICD-10-CM | POA: Insufficient documentation

## 2010-07-26 DIAGNOSIS — E785 Hyperlipidemia, unspecified: Secondary | ICD-10-CM

## 2010-08-02 ENCOUNTER — Other Ambulatory Visit: Payer: Self-pay | Admitting: Internal Medicine

## 2010-08-05 NOTE — Assessment & Plan Note (Signed)
Summary: 6 MO FU /NWS #   Vital Signs:  Patient profile:   60 year old female Weight:      147.12 pounds (66.87 kg) O2 Sat:      96 % on Room air Temp:     97.5 degrees F (36.39 degrees C) oral Pulse rate:   102 / minute BP sitting:   112 / 82  (left arm) Cuff size:   regular  Vitals Entered By: Orlan Leavens RMA (July 26, 2010 11:12 AM)  O2 Flow:  Room air CC: 6 month follow-up Is Patient Diabetic? No Pain Assessment Patient in pain? no        Primary Care Provider:  Newt Lukes MD  CC:  6 month follow-up.  History of Present Illness: here for followup here with her sister today, [name]  schizophrenia - follows with psyc - dr. Jennelle Human visits q 6-8 weeks for same symptoms have been well-controlled.  No recent mood changes. No hallucinations.  No recent med changes  GERD with hx esoph spasm   on Dilt for same since 05/2005 no daily reflux or burning symptoms  dyslipidemia Has been on increased dose of simvastatin since 01/2009 No GI or muscle complaints at current dose also takes fish oil capsules  Obesity. Patient reports she is working on diet to lose weight, stll exercising She is uncertain of recent weight changes.    Clinical Review Panels:  Lipid Management   Cholesterol:  164 (12/22/2009)   LDL (bad choesterol):  107 (10/21/2008)   HDL (good cholesterol):  53.70 (12/22/2009)  CBC   WBC:  5.6 (10/21/2008)   RBC:  4.69 (10/21/2008)   Hgb:  13.5 (10/21/2008)   Hct:  42.2 (10/21/2008)   Platelets:  203 (10/21/2008)   MCV  90.0 (10/21/2008)   MCHC  32.0 (10/21/2008)   RDW  14.1 (10/21/2008)   PMN:  72 (05/21/2008)   Lymphs:  22 (05/21/2008)   Monos:  6 (05/21/2008)   Eosinophils:  0 (05/21/2008)   Basophil:  0 (05/21/2008)  Complete Metabolic Panel   Glucose:  91 (10/21/2008)   Sodium:  143 (10/21/2008)   Potassium:  4.2 (10/21/2008)   Chloride:  105 (10/21/2008)   CO2:  25 (10/21/2008)   BUN:  16 (10/21/2008)   Creatinine:  1.02  (10/21/2008)   Albumin:  4.1 (12/22/2009)   Total Protein:  6.3 (12/22/2009)   Calcium:  10.0 (10/21/2008)   Total Bili:  0.5 (12/22/2009)   Alk Phos:  93 (12/22/2009)   SGPT (ALT):  29 (12/22/2009)   SGOT (AST):  27 (12/22/2009)   Current Medications (verified): 1)  Simvastatin 40 Mg Tabs (Simvastatin) .[name].. 1 By Mouth At Bedtime 2)  Diltiazem Hcl Coated Beads 120 Mg Cp24 (Diltiazem Hcl Coated Beads) .[name].. 1 Capsule By Mouth Daily 3)  Clozapine 100 Mg  Tabs (Clozapine) .... Per Psychiatrist - Dose Unknown 4)  Librium 10 Mg  Caps (Chlordiazepoxide Hcl) .... Per Psychiatrist - Dose Unknown 5)  Tums Ultra 1000 1000 Mg  Chew (Calcium Carbonate Antacid) 6)  Miralax   Powd (Polyethylene Glycol 3350) .... As Needed 7)  Adult Aspirin Low Strength 81 Mg  Tbdp (Aspirin) 8)  Fish Oil 1000 Mg Caps (Omega-3 Fatty Acids) .... Take 3 By Mouth Qd 9)  Vitamin D 1000 Unit Tabs (Cholecalciferol) .... Take 1 By Mouth Once Daily  Allergies (verified): No Known Drug Allergies  Past History:  Past Medical History: Esophageal dysmotility (April 2007-barium swallow) GERD  Menopausal since  ~  60 yo Schizophrenia - well-controlled and stable on Clozapine and Librium for years.   Hyperlipidemia  Review of Systems  The patient denies chest pain, syncope, headaches, and abdominal pain.         c/o tenderness to pressure over right knee (prepatella) - no swelling - no trauma - no fever. onset few days ago - no effect on walking or climbing.  Physical Exam  General:  alert, well-developed, well-nourished, and cooperative to examination.   sister [name] at side Lungs:  normal respiratory effort, no intercostal retractions or use of accessory muscles; normal breath sounds bilaterally - no crackles and no wheezes.    Heart:  normal rate, regular rhythm, no murmur, and no rub. BLE without edema. Msk:  right knee: full range of motion, no joint effusion or swelling. no erythema or abnormal warmth. Stable to  ligamentous testing. Nontender to palpation. Neurovascularly intact.    Impression & Recommendations:  Problem # 1:  BURSITIS, RIGHT KNEE (ICD-726.60)  antiinfalm x 1 week - meloxicam erx done also advised ice pack f/u if worse  Orders: Prescription Created Electronically [phone])  Problem # 2:  HYPERLIPIDEMIA (ICD-272.4)  Her updated medication list for this problem includes:    Simvastatin 40 Mg Tabs (Simvastatin) .[name]... 1 by mouth at bedtime   began higher dose statin 01/2009 - tol well  Labs Reviewed: SGOT: 27 (12/22/2009)   SGPT: 29 (12/22/2009)   HDL:53.70 (12/22/2009), 55.30 (05/27/2009)  LDL:107 (10/21/2008), 71 (60/45/4098)  Chol:164 (12/22/2009), 165 (05/27/2009)  Trig:212.0 (12/22/2009), 248.0 (05/27/2009)  Problem # 3:  SCHIZOPHRENIA (ICD-295.90)  chronic symptoms are well-controlled. Continue current medications and psych followup with Dr. Jennelle Human has ongoing  Complete Medication List: 1)  Simvastatin 40 Mg Tabs (Simvastatin) .[name].. 1 by mouth at bedtime 2)  Diltiazem Hcl Coated Beads 120 Mg Cp24 (Diltiazem hcl coated beads) .[name].. 1 capsule by mouth daily 3)  Clozapine 100 Mg Tabs (Clozapine) .... Per psychiatrist - dose unknown 4)  Librium 10 Mg Caps (Chlordiazepoxide hcl) .... Per psychiatrist - dose unknown 5)  Tums Ultra 1000 1000 Mg Chew (Calcium carbonate antacid) 6)  Miralax Powd (Polyethylene glycol 3350) .... As needed 7)  Adult Aspirin Low Strength 81 Mg Tbdp (Aspirin) 8)  Fish Oil 1000 Mg Caps (Omega-3 fatty acids) .... Take 3 by mouth qd 9)  Vitamin D 1000 Unit Tabs (Cholecalciferol) .... Take 1 by mouth once daily 10)  Meloxicam 7.5 Mg Tabs (Meloxicam) .[name].. 1 by mouth once daily x 7 days, then as needed for pain  Patient Instructions: 1)  it was good to see you today. 2)  use meloxicam once daily x 1 week for knee pain, then as needed  - your prescription has been electronically submitted to your pharmacy. Please take as directed. Contact our office if  you believe you're having problems with the medication(s).  3)  Please schedule a follow-up appointment in 6 months for cholesterol and liver check, call sooner if problems.  Prescriptions: MELOXICAM 7.5 MG TABS (MELOXICAM) 1 by mouth once daily x 7 days, then as needed for pain  #30 x 0   Entered and Authorized by:   Newt Lukes MD   Signed by:   Newt Lukes MD on 07/26/2010   Method used:   Electronically to        [id number] (retail)       [address]       [place], [place]  [address]       Ph: [phone]  Fax: [phone]   RxID:   [id number]    Orders Added: 1)  Est. Patient Level IV [30865] 2)  Prescription Created Electronically [phone]

## 2010-08-12 ENCOUNTER — Telehealth: Payer: Self-pay | Admitting: Internal Medicine

## 2010-08-12 DIAGNOSIS — K5900 Constipation, unspecified: Secondary | ICD-10-CM

## 2010-08-12 NOTE — Telephone Encounter (Signed)
Please obtain KUB to see if she is impacted ( overflow diarrhea), if not, start Metamucil 1 tablsp daily. If KUB shows constipation, give her 1 bottle of Mag citrate to evacuate the impaction and then restart Miralax.

## 2010-08-12 NOTE — Telephone Encounter (Signed)
Spoke with patient's sister [name]. She states for the last 1-2 weeks, patient has had small amounts of involuntary bowel movements. Sometimes it occurs during sleep. Patient does have chronic constipation due to taking Clozapine. She takes Miralax daily. Denies any new medications except Boniva which she started this week. Last colonoscopy-03/16/10- polypoid polyp x 2.Please, advise.

## 2010-08-16 NOTE — Telephone Encounter (Signed)
I spoke with [name] and she can't bring her Mom until 08/18/10.  She is advised we will call her back once we have the results.

## 2010-08-18 ENCOUNTER — Ambulatory Visit (INDEPENDENT_AMBULATORY_CARE_PROVIDER_SITE_OTHER)
Admission: RE | Admit: 2010-08-18 | Discharge: 2010-08-18 | Disposition: A | Discharge status: Home / Self Care | Payer: 59 | Source: Ambulatory Visit | Attending: Internal Medicine | Admitting: Internal Medicine

## 2010-08-18 DIAGNOSIS — K5900 Constipation, unspecified: Secondary | ICD-10-CM

## 2010-08-19 ENCOUNTER — Telehealth: Payer: Self-pay | Admitting: *Deleted

## 2010-08-19 NOTE — Telephone Encounter (Signed)
Spoke with patient and she wanted me to call her sister [name] with instructions. Spoke with [name] and gave her the results and Dr. Regino Schultze recommendations. She will get the Magnesium Citrate and fiber supplements.

## 2010-08-19 NOTE — Telephone Encounter (Signed)
Message copied by Jesse Fall on Thu Aug 19, 2010 10:13 AM ------      Message from: Lina Sar      Created: Wed Aug 18, 2010 10:21 PM       It appears that pt is constipated and that her diarrhea may be from an oveflow. Please call pt to advice to take 1 OTC bottle of Mag citrate, which should clean the excess stool, then start on a fiber supplement qd.

## 2010-11-22 ENCOUNTER — Other Ambulatory Visit: Payer: Self-pay | Admitting: Internal Medicine

## 2010-12-03 ENCOUNTER — Encounter: Payer: Self-pay | Admitting: Internal Medicine

## 2010-12-03 DIAGNOSIS — E663 Overweight: Secondary | ICD-10-CM

## 2011-02-02 ENCOUNTER — Ambulatory Visit (INDEPENDENT_AMBULATORY_CARE_PROVIDER_SITE_OTHER): Payer: 59 | Admitting: Internal Medicine

## 2011-02-02 ENCOUNTER — Encounter: Payer: Self-pay | Admitting: Internal Medicine

## 2011-02-02 ENCOUNTER — Other Ambulatory Visit (INDEPENDENT_AMBULATORY_CARE_PROVIDER_SITE_OTHER): Payer: 59

## 2011-02-02 VITALS — BP 102/68 | HR 111 | Temp 97.8°F | Ht 62.0 in | Wt 143.0 lb

## 2011-02-02 DIAGNOSIS — F209 Schizophrenia, unspecified: Secondary | ICD-10-CM

## 2011-02-02 DIAGNOSIS — E785 Hyperlipidemia, unspecified: Secondary | ICD-10-CM

## 2011-02-02 DIAGNOSIS — Z79899 Other long term (current) drug therapy: Secondary | ICD-10-CM

## 2011-02-02 DIAGNOSIS — Z23 Encounter for immunization: Secondary | ICD-10-CM

## 2011-02-02 LAB — HEPATIC FUNCTION PANEL
AST: 20 U/L (ref 0–37)
Albumin: 4.4 g/dL (ref 3.5–5.2)
Total Bilirubin: 0.6 mg/dL (ref 0.3–1.2)
Total Protein: 7.3 g/dL (ref 6.0–8.3)

## 2011-02-02 LAB — LIPID PANEL
Cholesterol: 171 mg/dL (ref 0–200)
LDL Cholesterol: 76 mg/dL (ref 0–99)
VLDL: 28.6 mg/dL (ref 0.0–40.0)

## 2011-02-02 NOTE — Patient Instructions (Signed)
It was good to see you today. Test(s) ordered today. Your results will be called to you after review (48-72hours after test completion). If any changes need to be made, you will be notified at that time. Medications reviewed, no changes at this time. Please schedule followup in 6 months for cholesterol check and weight check, call sooner if problems. Good job on weight loss! Keep up the good work!

## 2011-02-02 NOTE — Assessment & Plan Note (Signed)
Follows with psyc ([facility]) for same No recent med changes or mood/behavior problems

## 2011-02-02 NOTE — Progress Notes (Signed)
  Subjective:    Patient ID: Nancy Brooks, female    DOB: November 17, 1950, 60 y.o.   MRN: [id number]  HPI here for followup - reviewed chronic medical issues today here with her sister today, [name]   schizophrenia - follows with psyc - dr. Jennelle Human visits q 6-8 weeks for same  symptoms have been well-controlled.  No recent mood changes. No hallucinations.  No recent med changes   GERD with hx esoph spasm  on Dilt for same since 05/2005  no daily reflux or burning symptoms   dyslipidemia hx increased dose of simvastatin since 01/2009  No GI or muscle complaints at current dose  also takes fish oil capsules   Obesity.  Patient reports she is working on diet to lose weight, also exercising -walking She is uncertain of recent weight changes.   Past Medical History  Diagnosis Date  . BURSITIS, RIGHT KNEE   . Dyskinesia of esophagus   . GAIT DISTURBANCE   . GERD   . HEMORRHOIDS, NOS   . HYPERLIPIDEMIA   . MENOPAUSAL SYNDROME   . SCHIZOPHRENIA   . SYMPTOM, INCONTINENCE, URGE     Review of Systems  Respiratory: Negative for shortness of breath and wheezing.   Cardiovascular: Negative for chest pain and leg swelling.       Objective:   Physical Exam BP 102/68  Pulse 111  Temp(Src) 97.8 F (36.6 C) (Oral)  Ht 5\' 2"  (1.575 m)  Wt 143 lb (64.864 kg)  BMI 26.15 kg/m2  SpO2 96% Wt Readings from Last 3 Encounters:  02/02/11 143 lb (64.864 kg)  07/26/10 147 lb 1.9 oz (66.733 kg)  12/22/09 148 lb 12.8 oz (67.495 kg)   Constitutional: She is overweight, but well-developed and well-nourished. No distress.  sister [name] at side Neck: Normal range of motion. Neck supple. No JVD present. No thyromegaly present.  Cardiovascular: Normal rate, regular rhythm and normal heart sounds.  No murmur heard. No BLE edema. Pulmonary/Chest: Effort normal and breath sounds normal. No respiratory distress. She has no wheezes.  Abdominal: Soft. Bowel sounds are normal. She exhibits no distension.  There is no tenderness. no masses Psychiatric: She has a normal mood and affect. Her behavior is reserved but will interact appropriately when spoken to. Judgment and thought content appropriate, mild MR behavior/speech.   Lab Results  Component Value Date   WBC 5.6 10/21/2008   HGB 13.5 10/21/2008   HCT 42.2 10/21/2008   PLT 203 10/21/2008   CHOL 164 12/22/2009   TRIG 212.0* 12/22/2009   HDL 53.70 12/22/2009   LDLDIRECT 84.9 12/22/2009   ALT 29 12/22/2009   AST 27 12/22/2009   NA 143 10/21/2008   K 4.2 10/21/2008   CL 105 10/21/2008   CREATININE 1.02 10/21/2008   BUN 16 10/21/2008   CO2 25 10/21/2008   TSH 4.006 05/21/2008       Assessment & Plan:  See problem list. Medications and labs reviewed today.

## 2011-02-02 NOTE — Assessment & Plan Note (Signed)
On statin - check lipids/LFTs now (annually) - adjust as needed The current medical regimen is effective;  continue present plan and medications.  

## 2011-02-04 ENCOUNTER — Telehealth: Payer: Self-pay | Admitting: *Deleted

## 2011-02-04 NOTE — Telephone Encounter (Signed)
Nancy Brooks return call back gave results concerning labs...02/04/11@2 :17pm/LMB

## 2011-02-04 NOTE — Telephone Encounter (Signed)
Call sister [name]) concerning pt labs. Miss and close note. Left msg on [name] vm to return call concerning labs...02/04/11@1 :47pm/LMB

## 2011-02-28 ENCOUNTER — Other Ambulatory Visit: Payer: Self-pay | Admitting: Internal Medicine

## 2011-03-15 ENCOUNTER — Other Ambulatory Visit: Payer: Self-pay | Admitting: Obstetrics and Gynecology

## 2011-03-15 DIAGNOSIS — Z1231 Encounter for screening mammogram for malignant neoplasm of breast: Secondary | ICD-10-CM

## 2011-04-20 ENCOUNTER — Ambulatory Visit
Admission: RE | Admit: 2011-04-20 | Discharge: 2011-04-20 | Disposition: A | Discharge status: Home / Self Care | Payer: 59 | Source: Ambulatory Visit | Attending: Obstetrics and Gynecology | Admitting: Obstetrics and Gynecology

## 2011-04-20 DIAGNOSIS — Z1231 Encounter for screening mammogram for malignant neoplasm of breast: Secondary | ICD-10-CM

## 2011-05-23 ENCOUNTER — Other Ambulatory Visit: Payer: Self-pay | Admitting: Internal Medicine

## 2011-08-22 ENCOUNTER — Other Ambulatory Visit: Payer: Self-pay | Admitting: *Deleted

## 2011-08-22 MED ORDER — DILTIAZEM HCL ER COATED BEADS 120 MG PO CP24: 120.0000 mg | ORAL_CAPSULE | Freq: Every day | ORAL | Status: DC

## 2011-08-22 MED ORDER — SIMVASTATIN 40 MG PO TABS: 40.0000 mg | ORAL_TABLET | Freq: Every day | ORAL | Status: DC

## 2011-10-05 ENCOUNTER — Other Ambulatory Visit (INDEPENDENT_AMBULATORY_CARE_PROVIDER_SITE_OTHER): Payer: 59

## 2011-10-05 ENCOUNTER — Encounter: Payer: Self-pay | Admitting: Internal Medicine

## 2011-10-05 ENCOUNTER — Ambulatory Visit (INDEPENDENT_AMBULATORY_CARE_PROVIDER_SITE_OTHER): Payer: 59 | Admitting: Internal Medicine

## 2011-10-05 VITALS — BP 110/72 | HR 110 | Temp 97.6°F | Ht 62.0 in | Wt 146.4 lb

## 2011-10-05 DIAGNOSIS — M810 Age-related osteoporosis without current pathological fracture: Secondary | ICD-10-CM

## 2011-10-05 DIAGNOSIS — E785 Hyperlipidemia, unspecified: Secondary | ICD-10-CM

## 2011-10-05 DIAGNOSIS — Z0000 Encounter for general adult medical examination without abnormal findings: Secondary | ICD-10-CM

## 2011-10-05 DIAGNOSIS — E663 Overweight: Secondary | ICD-10-CM

## 2011-10-05 DIAGNOSIS — F209 Schizophrenia, unspecified: Secondary | ICD-10-CM

## 2011-10-05 LAB — URINALYSIS, ROUTINE W REFLEX MICROSCOPIC
Bilirubin Urine: NEGATIVE
Hgb urine dipstick: NEGATIVE
Nitrite: NEGATIVE
Total Protein, Urine: NEGATIVE
Urine Glucose: NEGATIVE
pH: 6 (ref 5.0–8.0)

## 2011-10-05 LAB — CBC WITH DIFFERENTIAL/PLATELET
Basophils Relative: 0.3 % (ref 0.0–3.0)
Eosinophils Relative: 0 % (ref 0.0–5.0)
HCT: 40 % (ref 36.0–46.0)
Hemoglobin: 13.1 g/dL (ref 12.0–15.0)
Lymphs Abs: 1.2 10*3/uL (ref 0.7–4.0)
MCV: 87.7 fl (ref 78.0–100.0)
Monocytes Absolute: 0.4 10*3/uL (ref 0.1–1.0)
Monocytes Relative: 5.3 % (ref 3.0–12.0)
Neutro Abs: 5.9 10*3/uL (ref 1.4–7.7)
RBC: 4.56 Mil/uL (ref 3.87–5.11)
WBC: 7.5 10*3/uL (ref 4.5–10.5)

## 2011-10-05 LAB — LIPID PANEL
Cholesterol: 165 mg/dL (ref 0–200)
HDL: 63.8 mg/dL (ref >39.00)
LDL Cholesterol: 66 mg/dL (ref 0–99)
VLDL: 34.8 mg/dL (ref 0.0–40.0)

## 2011-10-05 LAB — BASIC METABOLIC PANEL
BUN: 20 mg/dL (ref 6–23)
Chloride: 103 mEq/L (ref 96–112)
GFR: 60.39 mL/min (ref >60.00)
Potassium: 4.3 mEq/L (ref 3.5–5.1)
Sodium: 143 mEq/L (ref 135–145)

## 2011-10-05 LAB — HEPATIC FUNCTION PANEL
Albumin: 4.1 g/dL (ref 3.5–5.2)
Bilirubin, Direct: 0.1 mg/dL (ref 0.0–0.3)
Total Protein: 6.7 g/dL (ref 6.0–8.3)

## 2011-10-05 NOTE — Assessment & Plan Note (Signed)
started Boniva 03/2011 by gyn Per family, DEXA with mild osteoporosis - The current medical regimen is effective;  continue present plan and medications.

## 2011-10-05 NOTE — Patient Instructions (Addendum)
It was good to see you today. Health Maintenance reviewed - all recommended immunizations and age-appropriate screenings are up-to-date.  Test(s) ordered today. Your results will be called to you after review (48-72hours after test completion). If any changes need to be made, you will be notified at that time. Medications reviewed, no changes at this time. Updated today Work on lifestyle changes as discussed (low fat, low carb, increased protein diet; improved exercise efforts; weight loss) to control sugar, blood pressure and cholesterol levels and/or reduce risk of developing other medical problems. Look into [email or web address] or other type of food journal to assist you in this process. Please schedule followup in 6 months for weight check and medication review, call sooner if problems.

## 2011-10-05 NOTE — Progress Notes (Signed)
Subjective:    Patient ID: Nancy Brooks, female    DOB: 02/23/1951, 61 y.o.   MRN: [id number]  HPI  here with her sister today, [name]  patient is here today for annual physical. Patient feels well overall.  Also reviewed chronic medical issues: schizophrenia - follows with psyc - dr. Jennelle Human visits q 6-8 weeks for same  symptoms have been well-controlled.  No recent mood changes. No hallucinations.  No recent med changes   GERD with hx esoph spasm  on Diltiazem for same since 05/2005  no daily reflux or burning symptoms   dyslipidemia  - on statin, last increased dose of simvastatin 01/2009  No GI or muscle complaints at current dose  also takes fish oil capsules   Obesity.  Patient reports she is working on diet to lose weight, also exercising -walking She is uncertain of recent weight changes.   Past Medical History  Diagnosis Date  . BURSITIS, RIGHT KNEE   . Dyskinesia of esophagus   . GAIT DISTURBANCE   . GERD   . HEMORRHOIDS, NOS   . HYPERLIPIDEMIA   . MENOPAUSAL SYNDROME   . SCHIZOPHRENIA   . SYMPTOM, INCONTINENCE, URGE    Family History  Problem Relation Age of Onset  . Hypertension Mother   . Depression Mother   . Prostate cancer Father   . Colon cancer Brother 35   History  Substance Use Topics  . Smoking status: Never Smoker   . Smokeless tobacco: Not on file   Comment: Lives alone in single family home. Sister provides support and come to visits. Pt drives and indep ADLs.   [name] Alcohol Use: Not on file     Review of Systems Constitutional: Negative for fever or weight change.  Respiratory: Negative for cough and shortness of breath.   Cardiovascular: Negative for chest pain or palpitations.  Gastrointestinal: Negative for abdominal pain, no bowel changes.  Musculoskeletal: Negative for gait problem or joint swelling.  Skin: Negative for rash.  Neurological: Negative for dizziness or headache.  No other specific complaints in a complete review of  systems (except as listed in HPI above).     Objective:   Physical Exam  BP 110/72  Pulse 110  Temp(Src) 97.6 F (36.4 C) (Oral)  Ht 5\' 2"  (1.575 m)  Wt 146 lb 6.4 oz (66.407 kg)  BMI 26.78 kg/m2  SpO2 93% Wt Readings from Last 3 Encounters:  10/05/11 146 lb 6.4 oz (66.407 kg)  02/02/11 143 lb (64.864 kg)  07/26/10 147 lb 1.9 oz (66.733 kg)   Constitutional: She is overweight, but well-developed and well-nourished. No distress.  sister [name] at side HENT: NCAT, B TMs with non obstructing cerumen, TMs clear, Op clear and dentition fair Eyes: PERRL, EOMI - no icterus or conjunctivtis Neck: Normal range of motion. Neck supple. No JVD present. No thyromegaly present.  Cardiovascular: Normal rate, regular rhythm and normal heart sounds.  No murmur heard. No BLE edema. Pulmonary/Chest: Effort normal and breath sounds normal. No respiratory distress. She has no wheezes.  Abdominal: Soft. Bowel sounds are normal. She exhibits no distension. There is no tenderness. no masses Mskel: no gross deformity or joint effusions - gait normal Psychiatric: She has a normal mood and affect. Her behavior is reserved but will interact appropriately when spoken to. Judgment and thought content appropriate, mild MR behavior/speech.   Lab Results  Component Value Date   WBC 5.6 10/21/2008   HGB 13.5 10/21/2008   HCT 42.2 10/21/2008  PLT 203 10/21/2008   CHOL 171 02/02/2011   TRIG 143.0 02/02/2011   HDL 66.10 02/02/2011   LDLDIRECT 84.9 12/22/2009   ALT 21 02/02/2011   AST 20 02/02/2011   NA 143 10/21/2008   K 4.2 10/21/2008   CL 105 10/21/2008   CREATININE 1.02 10/21/2008   BUN 16 10/21/2008   CO2 25 10/21/2008   TSH 4.006 05/21/2008       Assessment & Plan:   CPX/v70.0 - Patient has been counseled on age-appropriate routine health concerns for screening and prevention. These are reviewed and up-to-date. Immunizations are up-to-date or declined. Labs ordered and will be reviewed.  Also See problem list.  Medications and labs reviewed today.

## 2011-10-05 NOTE — Assessment & Plan Note (Signed)
Wt Readings from Last 3 Encounters:  10/05/11 146 lb 6.4 oz (66.407 kg)  02/02/11 143 lb (64.864 kg)  07/26/10 147 lb 1.9 oz (66.733 kg)   Chronic issue The patient is asked to make an attempt to improve diet and exercise patterns to aid in medical management of this problem.

## 2011-10-05 NOTE — Assessment & Plan Note (Signed)
On statin - check lipids/LFTs now (annually) - adjust as needed The current medical regimen is effective;  continue present plan and medications.  

## 2011-10-05 NOTE — Assessment & Plan Note (Signed)
Follows with psyc ([facility]) for same No recent med changes or mood/behavior problems  

## 2012-02-06 ENCOUNTER — Other Ambulatory Visit: Payer: Self-pay | Admitting: Internal Medicine

## 2012-02-14 ENCOUNTER — Other Ambulatory Visit: Payer: Self-pay | Admitting: *Deleted

## 2012-02-14 MED ORDER — SIMVASTATIN 40 MG PO TABS: 40.0000 mg | ORAL_TABLET | Freq: Every day | ORAL | Status: DC

## 2012-02-14 NOTE — Telephone Encounter (Signed)
R'cd fax from [facility] for refill of Simvastatin

## 2012-03-22 ENCOUNTER — Other Ambulatory Visit: Payer: Self-pay | Admitting: Obstetrics and Gynecology

## 2012-03-22 DIAGNOSIS — Z1231 Encounter for screening mammogram for malignant neoplasm of breast: Secondary | ICD-10-CM

## 2012-03-29 ENCOUNTER — Telehealth: Payer: Self-pay | Admitting: *Deleted

## 2012-03-29 DIAGNOSIS — M810 Age-related osteoporosis without current pathological fracture: Secondary | ICD-10-CM

## 2012-03-29 DIAGNOSIS — N951 Menopausal and female climacteric states: Secondary | ICD-10-CM

## 2012-03-29 NOTE — Telephone Encounter (Signed)
Sister [name]) is made aware...Nancy Brooks

## 2012-03-29 NOTE — Telephone Encounter (Signed)
Requesting an order for bone density to [facility]. Need to be set up after 05/26/12...Raechel Chute

## 2012-03-29 NOTE — Telephone Encounter (Signed)
Ok order done 

## 2012-05-07 ENCOUNTER — Ambulatory Visit
Admission: RE | Admit: 2012-05-07 | Discharge: 2012-05-07 | Disposition: A | Discharge status: Home / Self Care | Payer: PRIVATE HEALTH INSURANCE | Source: Ambulatory Visit | Attending: Obstetrics and Gynecology | Admitting: Obstetrics and Gynecology

## 2012-05-07 DIAGNOSIS — Z1231 Encounter for screening mammogram for malignant neoplasm of breast: Secondary | ICD-10-CM

## 2012-05-14 ENCOUNTER — Ambulatory Visit: Payer: 59 | Admitting: Internal Medicine

## 2012-06-04 ENCOUNTER — Other Ambulatory Visit: Payer: PRIVATE HEALTH INSURANCE

## 2012-06-07 ENCOUNTER — Other Ambulatory Visit: Payer: PRIVATE HEALTH INSURANCE

## 2012-06-18 ENCOUNTER — Encounter: Payer: Self-pay | Admitting: Internal Medicine

## 2012-06-18 ENCOUNTER — Ambulatory Visit
Admission: RE | Admit: 2012-06-18 | Discharge: 2012-06-18 | Disposition: A | Discharge status: Home / Self Care | Payer: PRIVATE HEALTH INSURANCE | Source: Ambulatory Visit | Attending: Internal Medicine | Admitting: Internal Medicine

## 2012-06-18 ENCOUNTER — Other Ambulatory Visit: Payer: Self-pay | Admitting: Internal Medicine

## 2012-06-18 DIAGNOSIS — M810 Age-related osteoporosis without current pathological fracture: Secondary | ICD-10-CM

## 2012-06-18 DIAGNOSIS — N951 Menopausal and female climacteric states: Secondary | ICD-10-CM

## 2012-06-18 MED ORDER — DENOSUMAB 60 MG/ML ~~LOC~~ SOLN: 60.0000 mg | SUBCUTANEOUS | Status: DC

## 2012-06-26 ENCOUNTER — Encounter: Payer: Self-pay | Admitting: Internal Medicine

## 2012-06-26 ENCOUNTER — Telehealth: Payer: Self-pay | Admitting: *Deleted

## 2012-06-26 NOTE — Telephone Encounter (Signed)
Message copied by Deatra James on Tue Jun 26, 2012  2:47 PM ------      Message from: Clover Mealy      Created: Tue Jun 26, 2012  2:13 PM       Hi Valentina Gu,            I spoke w/Ms. Charnley's sister [name]) this morning regarding the Prolia inj.  She needs to think about the cost & she'll give [place] a call back.            Thanks Ruby            ----- Message -----         From: Deatra James, MA         Sent: 06/19/2012   4:29 PM           To: Crawford Givens McClinton            Hello Mrs. [name] Bellman!            Dr. Felicity Coyer is wanting this pt to start taking the prolia injection. Let me know if you need me to contact her sister [name]) with info..            Thanks Clarissa Laird       ------

## 2012-06-26 NOTE — Telephone Encounter (Signed)
Forward msg to md FYI...Raechel Chute

## 2012-06-26 NOTE — Telephone Encounter (Signed)
Noted thanks °

## 2012-07-02 ENCOUNTER — Other Ambulatory Visit: Payer: Self-pay | Admitting: Internal Medicine

## 2012-07-09 ENCOUNTER — Other Ambulatory Visit: Payer: Self-pay | Admitting: Internal Medicine

## 2012-08-28 ENCOUNTER — Encounter: Payer: Self-pay | Admitting: Internal Medicine

## 2012-08-28 ENCOUNTER — Ambulatory Visit (INDEPENDENT_AMBULATORY_CARE_PROVIDER_SITE_OTHER): Payer: PRIVATE HEALTH INSURANCE | Admitting: Internal Medicine

## 2012-08-28 VITALS — BP 120/78 | HR 108 | Temp 97.9°F | Wt 144.4 lb

## 2012-08-28 DIAGNOSIS — M810 Age-related osteoporosis without current pathological fracture: Secondary | ICD-10-CM

## 2012-08-28 NOTE — Progress Notes (Signed)
  Subjective:    Patient ID: Nancy Brooks, female    DOB: [birth year], 62 y.o.   MRN: [id number]  HPI  here with her sister today, [name]  Here for follow up - ?treatment options for osteoporosis and review DEXA results  Past Medical History  Diagnosis Date  . BURSITIS, RIGHT KNEE   . Dyskinesia of esophagus   . GAIT DISTURBANCE   . GERD   . HEMORRHOIDS, NOS   . HYPERLIPIDEMIA   . MENOPAUSAL SYNDROME   . SCHIZOPHRENIA   . SYMPTOM, INCONTINENCE, URGE   . Osteoporosis dx 03/2011    DEXA at gyn, started boniva in additon to Ca+D    Review of Systems  Respiratory: Negative for cough and shortness of breath.   Cardiovascular: Negative for chest pain or palpitations.      Objective:   Physical Exam  BP 120/78  Pulse 108  Temp(Src) 97.9 F (36.6 C) (Oral)  Wt 144 lb 6.4 oz (65.499 kg)  BMI 26.4 kg/m2  SpO2 94% Wt Readings from Last 3 Encounters:  08/28/12 144 lb 6.4 oz (65.499 kg)  10/05/11 146 lb 6.4 oz (66.407 kg)  02/02/11 143 lb (64.864 kg)   Constitutional: She is overweight, but well-developed and well-nourished. No distress.  sister [name] at side Cardiovascular: Normal rate, regular rhythm and normal heart sounds.  No murmur heard. No BLE edema. Pulmonary/Chest: Effort normal and breath sounds normal. No respiratory distress. She has no wheezes. Mskel: no gross deformity or joint effusions - gait normal Psychiatric: She has a normal mood and affect. Her behavior is reserved but will interact appropriately when spoken to. Judgment and thought content appropriate, mild MR behavior/speech.   Lab Results  Component Value Date   WBC 7.5 10/05/2011   HGB 13.1 10/05/2011   HCT 40.0 10/05/2011   PLT 193.0 10/05/2011   CHOL 165 10/05/2011   TRIG 174.0* 10/05/2011   HDL 63.80 10/05/2011   LDLDIRECT 84.9 12/22/2009   ALT 25 10/05/2011   AST 22 10/05/2011   NA 143 10/05/2011   K 4.3 10/05/2011   CL 103 10/05/2011   CREATININE 1.0 10/05/2011   BUN 20 10/05/2011   CO2 32  10/05/2011   TSH 2.52 10/05/2011       Assessment & Plan:   see problem list. Medications and labs reviewed today.

## 2012-08-28 NOTE — Assessment & Plan Note (Signed)
started Boniva 03/2011 by gyn Per family, DEXA with mild osteoporosis - followup DEXA February 2014 reviewed in depth today, shows progression of osteoporosis despite [facility] treatment After discussion of options with potential risk benefit of each therapy, family agrees to begin Proliaa injections every 6 months in place of Boniva Time spent with pt/family today 25 minutes, greater than 50% time spent counseling patient on DEXA 06/2012 results and medication review. Also review of prior records

## 2012-08-28 NOTE — Patient Instructions (Signed)
It was good to see you today. As discussed, we will begin Prolia injections every 6 Months in place of monthly Boniva to treat your osteoporosis My office will call regarding the scheduled appointment time for your first injection after insurance approval and to review Followup summer 2014 for annual physical and labs, please call sooner if problems

## 2012-09-06 ENCOUNTER — Telehealth: Payer: Self-pay | Admitting: *Deleted

## 2012-09-06 NOTE — Telephone Encounter (Signed)
Noted../lmb 

## 2012-09-06 NOTE — Telephone Encounter (Signed)
Message copied by Deatra James on Thu Sep 06, 2012 10:26 AM ------      Message from: Clover Mealy      Created: Thu Sep 06, 2012 10:17 AM      Regarding: RE: [place],            Mrs. Cleotilde Neer Commisso's sister made the appt for the Prolia inj for Monday, 09/10/12 @ 10 am.               Thanks  [name] Bellman                  ----- Message -----         From: Deatra James, MA         Sent: 08/28/2012  11:24 AM           To: Crawford Givens McClinton      Subject: Prolia                                                   Good morning Mrs. [name] Bellman!            This patient is wanting to start getting the prolia injections. Let me know if you need me to do anything...            Thanks Lucy       ------

## 2012-09-10 ENCOUNTER — Ambulatory Visit (INDEPENDENT_AMBULATORY_CARE_PROVIDER_SITE_OTHER): Payer: PRIVATE HEALTH INSURANCE | Admitting: *Deleted

## 2012-09-10 DIAGNOSIS — M810 Age-related osteoporosis without current pathological fracture: Secondary | ICD-10-CM

## 2012-09-10 MED ORDER — DENOSUMAB 60 MG/ML ~~LOC~~ SOLN
60.0000 mg | Freq: Once | SUBCUTANEOUS | Status: AC
  Administered 2012-09-10: 60 mg via SUBCUTANEOUS

## 2012-11-05 ENCOUNTER — Other Ambulatory Visit: Payer: Self-pay | Admitting: Internal Medicine

## 2012-12-31 ENCOUNTER — Other Ambulatory Visit: Payer: Self-pay | Admitting: *Deleted

## 2012-12-31 MED ORDER — DILTIAZEM HCL ER COATED BEADS 120 MG PO CP24: ORAL_CAPSULE | ORAL | Status: DC

## 2013-03-11 ENCOUNTER — Encounter: Payer: Self-pay | Admitting: Internal Medicine

## 2013-03-15 ENCOUNTER — Ambulatory Visit (INDEPENDENT_AMBULATORY_CARE_PROVIDER_SITE_OTHER): Payer: 59 | Admitting: Internal Medicine

## 2013-03-15 ENCOUNTER — Encounter: Payer: Self-pay | Admitting: Internal Medicine

## 2013-03-15 VITALS — BP 110/62 | HR 100 | Temp 97.6°F | Ht 61.0 in | Wt 136.0 lb

## 2013-03-15 DIAGNOSIS — S93402A Sprain of unspecified ligament of left ankle, initial encounter: Secondary | ICD-10-CM | POA: Insufficient documentation

## 2013-03-15 DIAGNOSIS — H109 Unspecified conjunctivitis: Secondary | ICD-10-CM | POA: Insufficient documentation

## 2013-03-15 DIAGNOSIS — S93409A Sprain of unspecified ligament of unspecified ankle, initial encounter: Secondary | ICD-10-CM

## 2013-03-15 MED ORDER — MELOXICAM 7.5 MG PO TABS: 7.5000 mg | ORAL_TABLET | Freq: Every day | ORAL | Status: DC | PRN

## 2013-03-15 MED ORDER — ERYTHROMYCIN 5 MG/GM OP OINT: TOPICAL_OINTMENT | OPHTHALMIC | Status: DC

## 2013-03-15 NOTE — Assessment & Plan Note (Signed)
Mild to mod, for antibx course,  to f/u any worsening symptoms or concerns 

## 2013-03-15 NOTE — Patient Instructions (Signed)
Please take all new medication as prescribed - the antibiotic for your right eye  Please continue all other medications as before, and refills have been done if requested - the anti-inflammatory for your left ankle  Please use ice to the left ankle for 15 min at three times per day for 2-3 days only  Please call if you change your mind about having the left ankle xray  You could also consider using an ACE wrap to the left ankle for support over the next 5-7 days, which helps decrease the pain with walking

## 2013-03-15 NOTE — Progress Notes (Signed)
Subjective:    Patient ID: Nancy Brooks, female    DOB: Dec 02, 1950, 62 y.o.   MRN: [id number]  HPI  Here with church friend who helps look out for her, drove her here.  C/o 2 days onset right eye red/discomfort and d/c with low grade temp, but no HA, ST,sinus or ear symtpoms, vision change, cough and Pt denies chest pain, increased sob or doe, wheezing, orthopnea, PND, increased LE swelling, palpitations, dizziness or syncope.  Does c/o ongoing fatigue, but denies signficant daytime hypersomnolence.  Denies urinary symptoms such as dysuria, frequency, urgency, flank pain, hematuria or n/v, chills. Incidentally fell out of bed last night, today with limping and left ankle swelling/bruising, no other falls or trauma.  No other recent falls. No ETOH or other drug use.  Out of mobic, not used for several months Past Medical History  Diagnosis Date  . BURSITIS, RIGHT KNEE   . Dyskinesia of esophagus   . GAIT DISTURBANCE   . GERD   . HEMORRHOIDS, NOS   . HYPERLIPIDEMIA   . MENOPAUSAL SYNDROME   . SCHIZOPHRENIA   . SYMPTOM, INCONTINENCE, URGE   . Osteoporosis dx 03/2011    DEXA at gyn, started boniva in additon to Ca+D   Past Surgical History  Procedure Laterality Date  . Breast surgery  05/09/1998    left breast biopsy    reports that she has never smoked. She does not have any smokeless tobacco history on file. She reports that she does not use illicit drugs. Her alcohol history is not on file. family history includes Colon cancer (age of onset: 6) in her brother; Depression in her mother; Hypertension in her mother; Prostate cancer in her father. No Known Allergies Current Outpatient Prescriptions on File Prior to Visit  Medication Sig Dispense Refill  . aspirin 81 MG tablet Take 81 mg by mouth daily.        . calcium elemental as carbonate (TUMS ULTRA 1000) 400 MG tablet Chew 1,000 mg by mouth daily.        . ChlordiazePOXIDE HCl (LIBRIUM PO) Take by mouth daily.        .  Cholecalciferol (VITAMIN D3) 1000 UNITS CAPS Take by mouth daily.        . cloZAPine (CLOZARIL) 100 MG tablet Take 100 mg by mouth daily.        [name] denosumab (PROLIA) 60 MG/ML SOLN injection Inject 60 mg into the skin every 6 (six) months. Administer in upper arm, thigh, or abdomen  1 each  1  . diltiazem (CARDIZEM CD) 120 MG 24 hr capsule TAKE ONE CAPSULE BY MOUTH ONE TIME DAILY  30 capsule  5  . Omega-3 Fatty Acids (FISH OIL) 1000 MG CAPS Take by mouth daily.        . simvastatin (ZOCOR) 40 MG tablet TAKE 1 TABLET BY MOUTH AT BEDTIME.  30 tablet  5   No current facility-administered medications on file prior to visit.    Review of Systems  Constitutional: Negative for unexpected weight change, or unusual diaphoresis  HENT: Negative for tinnitus.   Eyes: Negative for photophobia and visual disturbance.  Respiratory: Negative for choking and stridor.   Gastrointestinal: Negative for vomiting and blood in stool.  Genitourinary: Negative for hematuria and decreased urine volume.  Musculoskeletal: Negative for acute joint swelling Skin: Negative for color change and wound.  Neurological: Negative for tremors and numbness other than noted  Psychiatric/Behavioral: Negative for decreased concentration or  hyperactivity.  Objective:   Physical Exam BP 110/62  Pulse 100  Temp(Src) 97.6 F (36.4 C) (Oral)  Ht 5\' 1"  (1.549 m)  Wt 136 lb (61.689 kg)  BMI 25.71 kg/m2  SpO2 94% VS noted, mild ill appaering, fatigued Constitutional: Pt appears well-developed and well-nourished.  HENT: Head: NCAT.  Right Ear: External ear normal.  Left Ear: External ear normal.  Eyes: Conjunctivae and EOM are normal except for marked right conjucnt erythema, mucousy d/c, Pupils are equal, round, and reactive to light.  Neck: Normal range of motion. Neck supple.  Cardiovascular: Normal rate and regular rhythm.   Pulmonary/Chest: Effort normal and breath sounds normal.  Abd:  Soft, NT, +BS, no flank  tender Neurological: Pt is alert.  cn 2-12 intact, motor 5/5 Skin: Skin is warm. No erythema. No rash but left ankle with 2-3+ swelling over lateral malleolar with pain to inversion, o/w neurovasc intact, no lacerations but mild bruising noted near the malleolus and lateral heel Psychiatric: Pt behavior is normal. Thought content at baseline confusion per friend    Assessment & Plan:

## 2013-03-15 NOTE — Progress Notes (Signed)
Pre visit review using our clinic review tool, if applicable. No additional management support is needed unless otherwise documented below in the visit note. 

## 2013-03-15 NOTE — Assessment & Plan Note (Signed)
Mod, cant r/o fx with the bruising, but declines film for now, for mobic prn,  to f/u any worsening symptoms or concerns

## 2013-04-29 ENCOUNTER — Other Ambulatory Visit: Payer: Self-pay | Admitting: Internal Medicine

## 2013-05-10 ENCOUNTER — Other Ambulatory Visit: Payer: Self-pay

## 2013-05-10 DIAGNOSIS — Z1231 Encounter for screening mammogram for malignant neoplasm of breast: Secondary | ICD-10-CM

## 2013-05-27 ENCOUNTER — Other Ambulatory Visit: Payer: Self-pay | Admitting: Internal Medicine

## 2013-06-05 ENCOUNTER — Ambulatory Visit
Admission: RE | Admit: 2013-06-05 | Discharge: 2013-06-05 | Disposition: A | Discharge status: Home / Self Care | Payer: Self-pay | Source: Ambulatory Visit

## 2013-06-05 DIAGNOSIS — Z1231 Encounter for screening mammogram for malignant neoplasm of breast: Secondary | ICD-10-CM

## 2013-07-17 ENCOUNTER — Encounter: Payer: Self-pay | Admitting: Internal Medicine

## 2013-07-17 ENCOUNTER — Other Ambulatory Visit (INDEPENDENT_AMBULATORY_CARE_PROVIDER_SITE_OTHER): Payer: PRIVATE HEALTH INSURANCE

## 2013-07-17 ENCOUNTER — Ambulatory Visit (INDEPENDENT_AMBULATORY_CARE_PROVIDER_SITE_OTHER): Payer: PRIVATE HEALTH INSURANCE | Admitting: Internal Medicine

## 2013-07-17 VITALS — BP 110/70 | HR 90 | Temp 98.1°F | Wt 144.8 lb

## 2013-07-17 DIAGNOSIS — Z136 Encounter for screening for cardiovascular disorders: Secondary | ICD-10-CM

## 2013-07-17 DIAGNOSIS — E785 Hyperlipidemia, unspecified: Secondary | ICD-10-CM

## 2013-07-17 DIAGNOSIS — R5381 Other malaise: Secondary | ICD-10-CM

## 2013-07-17 DIAGNOSIS — M810 Age-related osteoporosis without current pathological fracture: Secondary | ICD-10-CM

## 2013-07-17 DIAGNOSIS — Z0000 Encounter for general adult medical examination without abnormal findings: Secondary | ICD-10-CM

## 2013-07-17 DIAGNOSIS — R5383 Other fatigue: Secondary | ICD-10-CM

## 2013-07-17 DIAGNOSIS — F209 Schizophrenia, unspecified: Secondary | ICD-10-CM

## 2013-07-17 DIAGNOSIS — Z79899 Other long term (current) drug therapy: Secondary | ICD-10-CM

## 2013-07-17 LAB — CBC WITH DIFFERENTIAL/PLATELET
Basophils Absolute: 0 10*3/uL (ref 0.0–0.1)
Basophils Relative: 0.1 % (ref 0.0–3.0)
EOS ABS: 0 10*3/uL (ref 0.0–0.7)
Eosinophils Relative: 0 % (ref 0.0–5.0)
HEMATOCRIT: 39.2 % (ref 36.0–46.0)
Hemoglobin: 13 g/dL (ref 12.0–15.0)
LYMPHS ABS: 1.4 10*3/uL (ref 0.7–4.0)
Lymphocytes Relative: 19 % (ref 12.0–46.0)
MCHC: 33.2 g/dL (ref 30.0–36.0)
MCV: 87.7 fl (ref 78.0–100.0)
Monocytes Absolute: 0.3 10*3/uL (ref 0.1–1.0)
Monocytes Relative: 3.6 % (ref 3.0–12.0)
NEUTROS PCT: 77.3 % — AB (ref 43.0–77.0)
Neutro Abs: 5.5 10*3/uL (ref 1.4–7.7)
PLATELETS: 222 10*3/uL (ref 150.0–400.0)
RBC: 4.47 Mil/uL (ref 3.87–5.11)
RDW: 14 % (ref 11.5–14.6)
WBC: 7.1 10*3/uL (ref 4.5–10.5)

## 2013-07-17 LAB — HEPATIC FUNCTION PANEL
ALT: 18 U/L (ref 0–35)
AST: 21 U/L (ref 0–37)
Albumin: 4.2 g/dL (ref 3.5–5.2)
Alkaline Phosphatase: 80 U/L (ref 39–117)
BILIRUBIN DIRECT: 0.1 mg/dL (ref 0.0–0.3)
TOTAL PROTEIN: 7 g/dL (ref 6.0–8.3)
Total Bilirubin: 0.6 mg/dL (ref 0.3–1.2)

## 2013-07-17 LAB — LIPID PANEL
CHOLESTEROL: 193 mg/dL (ref 0–200)
HDL: 67.9 mg/dL (ref >39.00)
LDL CALC: 86 mg/dL (ref 0–99)
TRIGLYCERIDES: 197 mg/dL — AB (ref 0.0–149.0)
Total CHOL/HDL Ratio: 3
VLDL: 39.4 mg/dL (ref 0.0–40.0)

## 2013-07-17 LAB — BASIC METABOLIC PANEL
BUN: 17 mg/dL (ref 6–23)
CHLORIDE: 103 meq/L (ref 96–112)
CO2: 29 meq/L (ref 19–32)
CREATININE: 0.9 mg/dL (ref 0.4–1.2)
Calcium: 9.6 mg/dL (ref 8.4–10.5)
GFR: 65.27 mL/min (ref >60.00)
GLUCOSE: 101 mg/dL — AB (ref 70–99)
POTASSIUM: 4.3 meq/L (ref 3.5–5.1)
Sodium: 139 mEq/L (ref 135–145)

## 2013-07-17 LAB — TSH: TSH: 3.37 u[IU]/mL (ref 0.35–5.50)

## 2013-07-17 NOTE — Progress Notes (Signed)
Pre visit review using our clinic review tool, if applicable. No additional management support is needed unless otherwise documented below in the visit note. 

## 2013-07-17 NOTE — Assessment & Plan Note (Signed)
On statin - check lipids/LFTs now (annually) - adjust as needed Stop fish oil The current medical regimen is effective;  continue present plan and medications.

## 2013-07-17 NOTE — Assessment & Plan Note (Signed)
Follows with psyc ([facility]) for same Reports request for ECG - ?anticipated med changes - performed today Denies recent med changes or mood/behavior problems

## 2013-07-17 NOTE — Patient Instructions (Addendum)
It was good to see you today.  We have reviewed your prior records including labs and tests today  Health Maintenance reviewed - all recommended immunizations and age-appropriate screenings are up-to-date.  Test(s) ordered today. Your results will be released to [place] (or called to you) after review, usually within 72hours after test completion. If any changes need to be made, you will be notified at that same time.  Medications reviewed and updated, no changes recommended at this time.  Will schedule for your Prolia injection every 6 months - next as soon as able  Please schedule followup in 12 months for annual exam and labs, call sooner if problems.   Health Maintenance, Female A healthy lifestyle and preventative care can promote health and wellness.  Maintain regular health, dental, and eye exams.  Eat a healthy diet. Foods like vegetables, fruits, whole grains, low-fat dairy products, and lean protein foods contain the nutrients you need without too many calories. Decrease your intake of foods high in solid fats, added sugars, and salt. Get information about a proper diet from your caregiver, if necessary.  Regular physical exercise is one of the most important things you can do for your health. Most adults should get at least 150 minutes of moderate-intensity exercise (any activity that increases your heart rate and causes you to sweat) each week. In addition, most adults need muscle-strengthening exercises on 2 or more days a week.   Maintain a healthy weight. The body mass index (BMI) is a screening tool to identify possible weight problems. It provides an estimate of body fat based on height and weight. Your caregiver can help determine your BMI, and can help you achieve or maintain a healthy weight. For adults 20 years and older:  A BMI below 18.5 is considered underweight.  A BMI of 18.5 to 24.9 is normal.  A BMI of 25 to 29.9 is considered overweight.  A BMI of 30 and  above is considered obese.  Maintain normal blood lipids and cholesterol by exercising and minimizing your intake of saturated fat. Eat a balanced diet with plenty of fruits and vegetables. Blood tests for lipids and cholesterol should begin at age 24 and be repeated every 5 years. If your lipid or cholesterol levels are high, you are over 50, or you are a high risk for heart disease, you may need your cholesterol levels checked more frequently.Ongoing high lipid and cholesterol levels should be treated with medicines if diet and exercise are not effective.  If you smoke, find out from your caregiver how to quit. If you do not use tobacco, do not start.  Lung cancer screening is recommended for adults aged 49 80 years who are at high risk for developing lung cancer because of a history of smoking. Yearly low-dose computed tomography (CT) is recommended for people who have at least a 30-pack-year history of smoking and are a current smoker or have quit within the past 15 years. A pack year of smoking is smoking an average of 1 pack of cigarettes a day for 1 year (for example: 1 pack a day for 30 years or 2 packs a day for 15 years). Yearly screening should continue until the smoker has stopped smoking for at least 15 years. Yearly screening should also be stopped for people who develop a health problem that would prevent them from having lung cancer treatment.  If you are pregnant, do not drink alcohol. If you are breastfeeding, be very cautious about drinking alcohol. If you  are not pregnant and choose to drink alcohol, do not exceed 1 drink per day. One drink is considered to be 12 ounces (355 mL) of beer, 5 ounces (148 mL) of wine, or 1.5 ounces (44 mL) of liquor.  Avoid use of street drugs. Do not share needles with anyone. Ask for help if you need support or instructions about stopping the use of drugs.  High blood pressure causes heart disease and increases the risk of stroke. Blood pressure should  be checked at least every 1 to 2 years. Ongoing high blood pressure should be treated with medicines, if weight loss and exercise are not effective.  If you are 13 to 61 years old, ask your caregiver if you should take aspirin to prevent strokes.  Diabetes screening involves taking a blood sample to check your fasting blood sugar level. This should be done once every 3 years, after age 32, if you are within normal weight and without risk factors for diabetes. Testing should be considered at a younger age or be carried out more frequently if you are overweight and have at least 1 risk factor for diabetes.  Breast cancer screening is essential preventative care for women. You should practice "breast self-awareness." This means understanding the normal appearance and feel of your breasts and may include breast self-examination. Any changes detected, no matter how small, should be reported to a caregiver. Women in their 55s and 30s should have a clinical breast exam (CBE) by a caregiver as part of a regular health exam every 1 to 3 years. After age 33, women should have a CBE every year. Starting at age 40, women should consider having a mammogram (breast X-ray) every year. Women who have a family history of breast cancer should talk to their caregiver about genetic screening. Women at a high risk of breast cancer should talk to their caregiver about having an MRI and a mammogram every year.  Breast cancer gene (BRCA)-related cancer risk assessment is recommended for women who have family members with BRCA-related cancers. BRCA-related cancers include breast, ovarian, tubal, and peritoneal cancers. Having family members with these cancers may be associated with an increased risk for harmful changes (mutations) in the breast cancer genes BRCA1 and BRCA2. Results of the assessment will determine the need for genetic counseling and BRCA1 and BRCA2 testing.  The Pap test is a screening test for cervical cancer.  Women should have a Pap test starting at age 5. Between ages 25 and 43, Pap tests should be repeated every 2 years. Beginning at age 87, you should have a Pap test every 3 years as long as the past 3 Pap tests have been normal. If you had a hysterectomy for a problem that was not cancer or a condition that could lead to cancer, then you no longer need Pap tests. If you are between ages 27 and 42, and you have had normal Pap tests going back 10 years, you no longer need Pap tests. If you have had past treatment for cervical cancer or a condition that could lead to cancer, you need Pap tests and screening for cancer for at least 20 years after your treatment. If Pap tests have been discontinued, risk factors (such as a new sexual partner) need to be reassessed to determine if screening should be resumed. Some women have medical problems that increase the chance of getting cervical cancer. In these cases, your caregiver may recommend more frequent screening and Pap tests.  The human papillomavirus (HPV) test  is an additional test that may be used for cervical cancer screening. The HPV test looks for the virus that can cause the cell changes on the cervix. The cells collected during the Pap test can be tested for HPV. The HPV test could be used to screen women aged 38 years and older, and should be used in women of any age who have unclear Pap test results. After the age of 60, women should have HPV testing at the same frequency as a Pap test.  Colorectal cancer can be detected and often prevented. Most routine colorectal cancer screening begins at the age of 26 and continues through age 56. However, your caregiver may recommend screening at an earlier age if you have risk factors for colon cancer. On a yearly basis, your caregiver may provide home test kits to check for hidden blood in the stool. Use of a small camera at the end of a tube, to directly examine the colon (sigmoidoscopy or colonoscopy), can detect  the earliest forms of colorectal cancer. Talk to your caregiver about this at age 25, when routine screening begins. Direct examination of the colon should be repeated every 5 to 10 years through age 62, unless early forms of pre-cancerous polyps or small growths are found.  Hepatitis C blood testing is recommended for all people born from [birth year] through 1963 and any individual with known risks for hepatitis C.  Practice safe sex. Use condoms and avoid high-risk sexual practices to reduce the spread of sexually transmitted infections (STIs). Sexually active women aged 2 and younger should be checked for Chlamydia, which is a common sexually transmitted infection. Older women with new or multiple partners should also be tested for Chlamydia. Testing for other STIs is recommended if you are sexually active and at increased risk.  Osteoporosis is a disease in which the bones lose minerals and strength with aging. This can result in serious bone fractures. The risk of osteoporosis can be identified using a bone density scan. Women ages 39 and over and women at risk for fractures or osteoporosis should discuss screening with their caregivers. Ask your caregiver whether you should be taking a calcium supplement or vitamin D to reduce the rate of osteoporosis.  Menopause can be associated with physical symptoms and risks. Hormone replacement therapy is available to decrease symptoms and risks. You should talk to your caregiver about whether hormone replacement therapy is right for you.  Use sunscreen. Apply sunscreen liberally and repeatedly throughout the day. You should seek shade when your shadow is shorter than you. Protect yourself by wearing long sleeves, pants, a wide-brimmed hat, and sunglasses year round, whenever you are outdoors.  Notify your caregiver of new moles or changes in moles, especially if there is a change in shape or color. Also notify your caregiver if a mole is larger than the size of a  pencil eraser.  Stay current with your immunizations. Document Released: 11/08/2010 Document Revised: 08/20/2012 Document Reviewed: 11/08/2010 [facility] Patient Information 2014 [place].

## 2013-07-17 NOTE — Progress Notes (Signed)
Subjective:    Patient ID: Nancy Brooks, female    DOB: 1950-09-27, 63 y.o.   MRN: [id number]  HPI here with her sister today, Nancy Brooks   Here for medicare wellness  Diet: heart healthy  Physical activity: sedentary Depression/mood screen: follows with Dr Clovis Pu monthly -stable symptoms  Hearing: intact to whispered voice Visual acuity: grossly normal, performs annual eye exam  ADLs: capable Fall risk: none Home safety: good Cognitive evaluation: intact to orientation, naming, recall and repetition EOL planning: adv directives, full code/ I agree  I have personally reviewed and have noted 1. The patient's medical and social history 2. Their use of alcohol, tobacco or illicit drugs 3. Their current medications and supplements 4. The patient's functional ability including ADL's, fall risks, home safety risks and hearing or visual impairment. 5. Diet and physical activities 6. Evidence for depression or mood disorders  also reviewed chronic medical issues and interval events   Past Medical History  Diagnosis Date  . BURSITIS, RIGHT KNEE   . Dyskinesia of esophagus   . GAIT DISTURBANCE   . GERD   . HEMORRHOIDS, NOS   . HYPERLIPIDEMIA   . MENOPAUSAL SYNDROME   . SCHIZOPHRENIA   . SYMPTOM, INCONTINENCE, URGE   . Osteoporosis dx 03/2011    DEXA at gyn, started boniva in additon to Ca+D, changed to [place] 09/2012   Family History  Problem Relation Age of Onset  . Hypertension Mother   . Depression Mother   . Prostate cancer Father   . Colon cancer Brother 1   History  Substance Use Topics  . Smoking status: Never Smoker   . Smokeless tobacco: Not on file     Comment: Lives alone in single family home. Sister provides support and come to visits. Pt drives and indep ADLs.   [name] Alcohol Use: Not on file    Review of Systems  Constitutional: Positive for fatigue. Negative for fever and unexpected weight change.  Respiratory: Negative for cough, shortness of breath and  wheezing.   Cardiovascular: Negative for chest pain, palpitations and leg swelling.  Gastrointestinal: Negative for nausea, abdominal pain and diarrhea.  Neurological: Negative for dizziness, weakness, light-headedness and headaches.  Psychiatric/Behavioral: Negative for dysphoric mood. The patient is not nervous/anxious.   All other systems reviewed and are negative.         Objective:   Physical Exam BP 110/70  Pulse 90  Temp(Src) 98.1 F (36.7 C) (Oral)  Wt 144 lb 12.8 oz (65.681 kg)  SpO2 90% Wt Readings from Last 3 Encounters:  07/17/13 144 lb 12.8 oz (65.681 kg)  03/15/13 136 lb (61.689 kg)  08/28/12 144 lb 6.4 oz (65.499 kg)   Constitutional: She is overweight, but well-developed and well-nourished. No distress.  sister Nancy Brooks at side Cardiovascular: Normal rate, regular rhythm and normal heart sounds.  No murmur heard. No BLE edema. Pulmonary/Chest: Effort normal and breath sounds normal. No respiratory distress. She has no wheezes. Mskel: no gross deformity or joint effusions - gait normal Psychiatric: She has a normal mood and affect. Her behavior is reserved but will interact appropriately when spoken to. Judgment and thought content appropriate, mild MR behavior/speech.   Lab Results  Component Value Date   WBC 7.5 10/05/2011   HGB 13.1 10/05/2011   HCT 40.0 10/05/2011   PLT 193.0 10/05/2011   CHOL 165 10/05/2011   TRIG 174.0* 10/05/2011   HDL 63.80 10/05/2011   LDLDIRECT 84.9 12/22/2009   ALT 25 10/05/2011   AST  22 10/05/2011   NA 143 10/05/2011   K 4.3 10/05/2011   CL 103 10/05/2011   CREATININE 1.0 10/05/2011   BUN 20 10/05/2011   CO2 32 10/05/2011   TSH 2.52 10/05/2011       Assessment & Plan:   CPX/AWV/v70.0 - Today patient counseled on age appropriate routine health concerns for screening and prevention, each reviewed and up to date or declined. Immunizations reviewed and up to date or declined. Labs/ECG reviewed. Risk factors for depression reviewed and  negative. Hearing function and visual acuity are intact. ADLs screened and addressed as needed. Functional ability and level of safety reviewed and appropriate. Education, counseling and referrals performed based on assessed risks today. Patient provided with a copy of personalized plan for preventive services.  Fatigue - nonspecific symptoms/exam - check screening labs  Problem List Items Addressed This Visit   HYPERLIPIDEMIA     On statin - check lipids/LFTs now (annually) - adjust as needed Stop fish oil The current medical regimen is effective;  continue present plan and medications.      Relevant Orders      Lipid panel   SCHIZOPHRENIA     Follows with psyc ([facility]) for same Reports request for ECG - ?anticipated med changes - performed today Denies recent med changes or mood/behavior problems    Senile osteoporosis     started Boniva 03/2011 by gyn Per family, DEXA with mild osteoporosis - followup DEXA February 2014 reviewed in depth today, shows progression of osteoporosis despite [facility] treatment After discussion of options with potential risk benefit of each therapy, family agrees to begin Proliaa injections every 6 months - begun 09/2012 - schedule next dose now     Other Visit Diagnoses   Routine general medical examination at a health care facility    -  Primary    Relevant Orders       Basic metabolic panel       CBC with Differential       Hepatic function panel       TSH       Lipid panel    Other malaise and fatigue        Relevant Orders       Basic metabolic panel       CBC with Differential       Hepatic function panel       TSH    Encounter for long-term (current) use of other medications        Screening for ischemic heart disease        Relevant Orders       EKG 12-Lead

## 2013-07-17 NOTE — Assessment & Plan Note (Signed)
started Boniva 03/2011 by gyn Per family, DEXA with mild osteoporosis - followup DEXA February 2014 reviewed in depth today, shows progression of osteoporosis despite [facility] treatment After discussion of options with potential risk benefit of each therapy, family agrees to begin Proliaa injections every 6 months - begun 09/2012 - schedule next dose now

## 2013-07-22 ENCOUNTER — Telehealth: Payer: Self-pay | Admitting: *Deleted

## 2013-07-22 NOTE — Telephone Encounter (Signed)
Message copied by Earnstine Regal on Mon Jul 22, 2013  3:44 PM ------      Message from: Kathreen Cosier      Created: Mon Jul 22, 2013  3:23 PM      Regarding: RE: Leandrew Koyanagi,            Mrs. Fillingim insurance has approved her Prolia inj.              Thanks      Ruby            ----- Message -----         From: Earnstine Regal, MA         Sent: 07/17/2013   2:41 PM           To: Carollee Sires [place]      Subject: Prolia                                                   Hey Mrs. Bertram Millard,            Here is another one that is needing to get the prolia. Let me know when i can call her!            Thanks, Lorre Nick       ------

## 2013-07-22 NOTE — Telephone Encounter (Signed)
Called pt sister [name]) gave her approval status for prolia. Transferred to [facility] to make a nurse visit...Nancy Brooks

## 2013-07-30 ENCOUNTER — Ambulatory Visit (INDEPENDENT_AMBULATORY_CARE_PROVIDER_SITE_OTHER): Payer: PRIVATE HEALTH INSURANCE

## 2013-07-30 DIAGNOSIS — M810 Age-related osteoporosis without current pathological fracture: Secondary | ICD-10-CM

## 2013-07-30 MED ORDER — DENOSUMAB 60 MG/ML ~~LOC~~ SOLN
60.0000 mg | Freq: Once | SUBCUTANEOUS | Status: AC
  Administered 2013-07-30: 60 mg via SUBCUTANEOUS

## 2013-10-23 ENCOUNTER — Ambulatory Visit (INDEPENDENT_AMBULATORY_CARE_PROVIDER_SITE_OTHER): Payer: PRIVATE HEALTH INSURANCE | Admitting: *Deleted

## 2013-10-23 DIAGNOSIS — Z23 Encounter for immunization: Secondary | ICD-10-CM

## 2013-10-23 DIAGNOSIS — Z2911 Encounter for prophylactic immunotherapy for respiratory syncytial virus (RSV): Secondary | ICD-10-CM

## 2013-11-18 ENCOUNTER — Other Ambulatory Visit: Payer: Self-pay | Admitting: Internal Medicine

## 2014-05-19 ENCOUNTER — Other Ambulatory Visit: Payer: Self-pay

## 2014-05-19 DIAGNOSIS — Z1231 Encounter for screening mammogram for malignant neoplasm of breast: Secondary | ICD-10-CM

## 2014-06-04 DIAGNOSIS — Z79899 Other long term (current) drug therapy: Secondary | ICD-10-CM | POA: Diagnosis not present

## 2014-06-04 DIAGNOSIS — F209 Schizophrenia, unspecified: Secondary | ICD-10-CM | POA: Diagnosis not present

## 2014-06-10 ENCOUNTER — Ambulatory Visit: Payer: Self-pay

## 2014-06-12 ENCOUNTER — Ambulatory Visit
Admission: RE | Admit: 2014-06-12 | Discharge: 2014-06-12 | Disposition: A | Discharge status: Home / Self Care | Payer: Medicare Other | Source: Ambulatory Visit

## 2014-06-12 DIAGNOSIS — Z1231 Encounter for screening mammogram for malignant neoplasm of breast: Secondary | ICD-10-CM

## 2014-06-13 ENCOUNTER — Other Ambulatory Visit: Payer: Self-pay | Admitting: Internal Medicine

## 2014-06-13 DIAGNOSIS — R928 Other abnormal and inconclusive findings on diagnostic imaging of breast: Secondary | ICD-10-CM

## 2014-06-25 ENCOUNTER — Ambulatory Visit
Admission: RE | Admit: 2014-06-25 | Discharge: 2014-06-25 | Disposition: A | Discharge status: Home / Self Care | Payer: Medicare Other | Source: Ambulatory Visit | Attending: Internal Medicine | Admitting: Internal Medicine

## 2014-06-25 DIAGNOSIS — R928 Other abnormal and inconclusive findings on diagnostic imaging of breast: Secondary | ICD-10-CM

## 2014-07-07 DIAGNOSIS — N760 Acute vaginitis: Secondary | ICD-10-CM | POA: Diagnosis not present

## 2014-07-07 DIAGNOSIS — Z1159 Encounter for screening for other viral diseases: Secondary | ICD-10-CM | POA: Diagnosis not present

## 2014-07-16 ENCOUNTER — Ambulatory Visit: Payer: PRIVATE HEALTH INSURANCE | Admitting: Internal Medicine

## 2014-07-30 DIAGNOSIS — F209 Schizophrenia, unspecified: Secondary | ICD-10-CM | POA: Diagnosis not present

## 2014-07-30 DIAGNOSIS — Z79899 Other long term (current) drug therapy: Secondary | ICD-10-CM | POA: Diagnosis not present

## 2014-08-11 ENCOUNTER — Encounter: Payer: Self-pay | Admitting: Internal Medicine

## 2014-08-11 ENCOUNTER — Other Ambulatory Visit: Payer: Self-pay | Admitting: Internal Medicine

## 2014-08-11 ENCOUNTER — Other Ambulatory Visit (INDEPENDENT_AMBULATORY_CARE_PROVIDER_SITE_OTHER): Payer: Medicare Other

## 2014-08-11 ENCOUNTER — Ambulatory Visit (INDEPENDENT_AMBULATORY_CARE_PROVIDER_SITE_OTHER): Payer: Medicare Other | Admitting: Internal Medicine

## 2014-08-11 VITALS — BP 120/70 | HR 104 | Temp 97.7°F | Resp 16 | Wt 122.0 lb

## 2014-08-11 DIAGNOSIS — F209 Schizophrenia, unspecified: Secondary | ICD-10-CM

## 2014-08-11 DIAGNOSIS — E785 Hyperlipidemia, unspecified: Secondary | ICD-10-CM

## 2014-08-11 DIAGNOSIS — Z0000 Encounter for general adult medical examination without abnormal findings: Secondary | ICD-10-CM

## 2014-08-11 DIAGNOSIS — Z23 Encounter for immunization: Secondary | ICD-10-CM

## 2014-08-11 DIAGNOSIS — M810 Age-related osteoporosis without current pathological fracture: Secondary | ICD-10-CM

## 2014-08-11 LAB — HEPATIC FUNCTION PANEL
ALK PHOS: 97 U/L (ref 39–117)
ALT: 17 U/L (ref 0–35)
AST: 18 U/L (ref 0–37)
Albumin: 4.3 g/dL (ref 3.5–5.2)
BILIRUBIN DIRECT: 0 mg/dL (ref 0.0–0.3)
BILIRUBIN TOTAL: 0.4 mg/dL (ref 0.2–1.2)
TOTAL PROTEIN: 6.8 g/dL (ref 6.0–8.3)

## 2014-08-11 LAB — CBC WITH DIFFERENTIAL/PLATELET
Basophils Absolute: 0 10*3/uL (ref 0.0–0.1)
Basophils Relative: 0.5 % (ref 0.0–3.0)
Eosinophils Absolute: 0 10*3/uL (ref 0.0–0.7)
Eosinophils Relative: 0 % (ref 0.0–5.0)
HEMATOCRIT: 39.9 % (ref 36.0–46.0)
Hemoglobin: 13.4 g/dL (ref 12.0–15.0)
Lymphocytes Relative: 15.9 % (ref 12.0–46.0)
Lymphs Abs: 0.9 10*3/uL (ref 0.7–4.0)
MCHC: 33.6 g/dL (ref 30.0–36.0)
MCV: 86.5 fl (ref 78.0–100.0)
MONO ABS: 0.3 10*3/uL (ref 0.1–1.0)
Monocytes Relative: 5 % (ref 3.0–12.0)
NEUTROS PCT: 78.6 % — AB (ref 43.0–77.0)
Neutro Abs: 4.6 10*3/uL (ref 1.4–7.7)
PLATELETS: 238 10*3/uL (ref 150.0–400.0)
RBC: 4.61 Mil/uL (ref 3.87–5.11)
RDW: 13.4 % (ref 11.5–15.5)
WBC: 5.9 10*3/uL (ref 4.0–10.5)

## 2014-08-11 LAB — BASIC METABOLIC PANEL
BUN: 17 mg/dL (ref 6–23)
CO2: 31 mEq/L (ref 19–32)
Calcium: 10.7 mg/dL — ABNORMAL HIGH (ref 8.4–10.5)
Chloride: 104 mEq/L (ref 96–112)
Creatinine, Ser: 1.34 mg/dL — ABNORMAL HIGH (ref 0.40–1.20)
GFR: 42.67 mL/min — AB (ref >60.00)
Glucose, Bld: 95 mg/dL (ref 70–99)
POTASSIUM: 4.6 meq/L (ref 3.5–5.1)
SODIUM: 139 meq/L (ref 135–145)

## 2014-08-11 LAB — TSH: TSH: 4.83 u[IU]/mL — AB (ref 0.35–4.50)

## 2014-08-11 LAB — LIPID PANEL
CHOL/HDL RATIO: 3
Cholesterol: 164 mg/dL (ref 0–200)
HDL: 58.8 mg/dL (ref >39.00)
LDL Cholesterol: 73 mg/dL (ref 0–99)
NonHDL: 105.2
TRIGLYCERIDES: 160 mg/dL — AB (ref 0.0–149.0)
VLDL: 32 mg/dL (ref 0.0–40.0)

## 2014-08-11 MED ORDER — DILTIAZEM HCL ER COATED BEADS 120 MG PO CP24: 120.0000 mg | ORAL_CAPSULE | Freq: Every day | ORAL | Status: DC

## 2014-08-11 MED ORDER — IBANDRONATE SODIUM 150 MG PO TABS
150.0000 mg | ORAL_TABLET | ORAL | Status: DC
Start: 2014-08-11 — End: 2015-09-23

## 2014-08-11 NOTE — Progress Notes (Signed)
Subjective:    Patient ID: Nancy Brooks, female    DOB: February 15, 1951, 64 y.o.   MRN: [id number]  HPI   Here for medicare wellness  Diet: heart healthy  Physical activity: sedentary Depression/mood screen: negative Hearing: intact to whispered voice Visual acuity: grossly normal, performs annual eye exam  ADLs: capable Fall risk: none Home safety: good Cognitive evaluation: intact to orientation, naming, recall and repetition EOL planning: adv directives, full code/ I agree  I have personally reviewed and have noted 1. The patient's medical and social history 2. Their use of alcohol, tobacco or illicit drugs 3. Their current medications and supplements 4. The patient's functional ability including ADL's, fall risks, home safety risks and hearing or visual impairment. 5. Diet and physical activities 6. Evidence for depression or mood disorders  Also reviewed chronic conditions, interval events and current concerns. Reviewed intentional weight loss over the past year with diet and exercise changes. Power of attorney sister also request change back to Boniva from Prolia injections every 6 months due to ease of scheduling   Past Medical History  Diagnosis Date  . BURSITIS, RIGHT KNEE   . Dyskinesia of esophagus   . GAIT DISTURBANCE   . GERD   . HEMORRHOIDS, NOS   . HYPERLIPIDEMIA   . MENOPAUSAL SYNDROME   . SCHIZOPHRENIA   . SYMPTOM, INCONTINENCE, URGE   . Osteoporosis dx 03/2011    DEXA at gyn, started boniva in additon to Ca+D, changed to [place] 09/2012   Family History  Problem Relation Age of Onset  . Hypertension Mother   . Depression Mother   . Prostate cancer Father   . Colon cancer Brother 63   History  Substance Use Topics  . Smoking status: Never Smoker   . Smokeless tobacco: Not on file  . Alcohol Use: Not on file    Review of Systems  Constitutional: Negative for fatigue and unexpected weight change.  Respiratory: Negative for cough, shortness of  breath and wheezing.   Cardiovascular: Negative for chest pain, palpitations and leg swelling.  Gastrointestinal: Negative for nausea, abdominal pain and diarrhea.  Neurological: Negative for dizziness, weakness, light-headedness and headaches.  Psychiatric/Behavioral: Negative for dysphoric mood. The patient is not nervous/anxious.   All other systems reviewed and are negative.      Objective:    Physical Exam  Constitutional: She is oriented to person, place, and time. She appears well-developed and well-nourished. No distress.  Sister Georgette Shell at side  HENT:  Head: Normocephalic and atraumatic.  Right Ear: External ear normal.  Left Ear: External ear normal.  Nose: Nose normal.  Mouth/Throat: Oropharynx is clear and moist. No oropharyngeal exudate.  Eyes: EOM are normal. Pupils are equal, round, and reactive to light. Right eye exhibits no discharge. Left eye exhibits no discharge. No scleral icterus.  Neck: Normal range of motion. Neck supple. No JVD present. No tracheal deviation present. No thyromegaly present.  Cardiovascular: Normal rate, regular rhythm, normal heart sounds and intact distal pulses.  Exam reveals no friction rub.   No murmur heard. Pulmonary/Chest: Effort normal and breath sounds normal. No respiratory distress. She has no wheezes. She has no rales. She exhibits no tenderness.  Abdominal: Soft. Bowel sounds are normal. She exhibits no distension and no mass. There is no tenderness. There is no rebound and no guarding.  Genitourinary:  Defer to gyn  Musculoskeletal: Normal range of motion.  No gross deformities  Lymphadenopathy:    She has no cervical adenopathy.  Neurological: She is alert and oriented to person, place, and time. She has normal reflexes. No cranial nerve deficit.  Skin: Skin is warm and dry. No rash noted. She is not diaphoretic. No erythema.  Psychiatric: She has a normal mood and affect. Her behavior is normal. Judgment and thought  content normal.  Impulsive speech pattern, baseline  Nursing note and vitals reviewed.   BP 120/70 mmHg  Pulse 104  Temp(Src) 97.7 F (36.5 C) (Oral)  Resp 16  Wt 122 lb (55.339 kg)  SpO2 95% Wt Readings from Last 3 Encounters:  08/11/14 122 lb (55.339 kg)  07/17/13 144 lb 12.8 oz (65.681 kg)  03/15/13 136 lb (61.689 kg)     Lab Results  Component Value Date   WBC 7.1 07/17/2013   HGB 13.0 07/17/2013   HCT 39.2 07/17/2013   PLT 222.0 07/17/2013   GLUCOSE 101* 07/17/2013   CHOL 193 07/17/2013   TRIG 197.0* 07/17/2013   HDL 67.90 07/17/2013   LDLDIRECT 84.9 12/22/2009   LDLCALC 86 07/17/2013   ALT 18 07/17/2013   AST 21 07/17/2013   NA 139 07/17/2013   K 4.3 07/17/2013   CL 103 07/17/2013   CREATININE 0.9 07/17/2013   BUN 17 07/17/2013   CO2 29 07/17/2013   TSH 3.37 07/17/2013    Mm Digital Diagnostic Unilat L  06/25/2014   CLINICAL DATA:  Patient was called back from screening mammogram for a possible mass in the left breast.  EXAM: DIGITAL DIAGNOSTIC LEFT MAMMOGRAM  COMPARISON:  With priors.  ACR Breast Density Category b: There are scattered areas of fibroglandular density.  FINDINGS: Spot compression views of the left breast were obtained. No suspicious mass, malignant type microcalcifications or distortion detected.  IMPRESSION: No evidence of malignancy in the left breast.  RECOMMENDATION: Bilateral screening mammogram in 1 year is recommended.  I have discussed the findings and recommendations with the patient. Results were also provided in writing at the conclusion of the visit. If applicable, a reminder letter will be sent to the patient regarding the next appointment.  BI-RADS CATEGORY  1: Negative.   Electronically Signed   By: Lillia Mountain M.D.   On: 06/25/2014 13:43       Assessment & Plan:   AWV/z00.00 - Today patient counseled on age appropriate routine health concerns for screening and prevention, each reviewed and up to date or declined. Immunizations  reviewed and up to date or declined. Labs ordered and reviewed. Risk factors for depression reviewed and negative. Hearing function and visual acuity are intact. ADLs screened and addressed as needed. Functional ability and level of safety reviewed and appropriate. Education, counseling and referrals performed based on assessed risks today. Patient provided with a copy of personalized plan for preventive services.  Problem List Items Addressed This Visit    Hyperlipidemia    On statin - check lipids/LFTs now (annually) - adjust as needed The current medical regimen is effective;  continue present plan and medications.       Relevant Medications   diltiazem (CARDIZEM CD) 24 hr capsule   Other Relevant Orders   Lipid panel   Schizophrenia, unspecified type    Follows with psyc ([facility]) q36mo for same Denies recent med changes or mood/behavior problems The current medical regimen is effective;  continue present plan and medications.        Relevant Orders   CBC with Differential/Platelet   Hepatic function panel   TSH   Senile osteoporosis    started  Boniva 03/2011 by gyn Per family, DEXA with mild osteoporosis - followup DEXA February 2014 reviewed in depth today, shows progression of osteoporosis despite Bonita treatment Changed to Prolia injections every 6 months in 5//2014 - has 2 doses but wished to resume oral meds Reschedule follow up DEXA at GI now      Relevant Medications   BONIVA 150 MG PO TABS   Other Relevant Orders   DG Bone Density   Basic metabolic panel   TSH    Other Visit Diagnoses    Routine general medical examination at a health care facility    -  Primary        Gwendolyn Grant, MD

## 2014-08-11 NOTE — Progress Notes (Signed)
Pre visit review using our clinic review tool, if applicable. No additional management support is needed unless otherwise documented below in the visit note. 

## 2014-08-11 NOTE — Assessment & Plan Note (Signed)
started Boniva 03/2011 by gyn Per family, DEXA with mild osteoporosis - followup DEXA February 2014 reviewed in depth today, shows progression of osteoporosis despite Bonita treatment Changed to Prolia injections every 6 months in 5//2014 - has 2 doses but wished to resume oral meds Reschedule follow up DEXA at GI now

## 2014-08-11 NOTE — Assessment & Plan Note (Signed)
On statin - check lipids/LFTs now (annually) - adjust as needed The current medical regimen is effective;  continue present plan and medications.

## 2014-08-11 NOTE — Patient Instructions (Addendum)
It was good to see you today.  We have reviewed your prior records including labs and tests today  Health Maintenance reviewed - tetanus updated today, good for 10 years. All other recommended immunizations and age-appropriate screenings are up-to-date.  Test(s) ordered today. Your results will be released to MyChart (or called to you) after review, usually within 72hours after test completion. If any changes need to be made, you will be notified at that same time.  We'll make referral to  imaging breast Center for bone density scan. Our office will call with this appointment, then results once reviewed  Medications reviewed and updated Stop Prolia injections and resume Boniva once monthly. No other medication changes recommended at this time.  Refill on medication(s) as discussed today.  Please schedule followup in 12 months for annual exam and labs, call sooner if problems.   Health Maintenance Adopting a healthy lifestyle and getting preventive care can go a long way to promote health and wellness. Talk with your health care provider about what schedule of regular examinations is right for you. This is a good chance for you to check in with your provider about disease prevention and staying healthy. In between checkups, there are plenty of things you can do on your own. Experts have done a lot of research about which lifestyle changes and preventive measures are most likely to keep you healthy. Ask your health care provider for more information. WEIGHT AND DIET  Eat a healthy diet  Be sure to include plenty of vegetables, fruits, low-fat dairy products, and lean protein.  Do not eat a lot of foods high in solid fats, added sugars, or salt.  Get regular exercise. This is one of the most important things you can do for your health.  Most adults should exercise for at least 150 minutes each week. The exercise should increase your heart rate and make you sweat  (moderate-intensity exercise).  Most adults should also do strengthening exercises at least twice a week. This is in addition to the moderate-intensity exercise.  Maintain a healthy weight  Body mass index (BMI) is a measurement that can be used to identify possible weight problems. It estimates body fat based on height and weight. Your health care provider can help determine your BMI and help you achieve or maintain a healthy weight.  For females 20 years of age and older:   A BMI below 18.5 is considered underweight.  A BMI of 18.5 to 24.9 is normal.  A BMI of 25 to 29.9 is considered overweight.  A BMI of 30 and above is considered obese.  Watch levels of cholesterol and blood lipids  You should start having your blood tested for lipids and cholesterol at 62 years of age, then have this test every 5 years.  You may need to have your cholesterol levels checked more often if:  Your lipid or cholesterol levels are high.  You are older than 62 years of age.  You are at high risk for heart disease.  CANCER SCREENING   Lung Cancer  Lung cancer screening is recommended for adults 55-80 years old who are at high risk for lung cancer because of a history of smoking.  A yearly low-dose CT scan of the lungs is recommended for people who:  Currently smoke.  Have quit within the past 15 years.  Have at least a 30-pack-year history of smoking. A pack year is smoking an average of one pack of cigarettes a day for 1 year.    Yearly screening should continue until it has been 15 years since you quit.  Yearly screening should stop if you develop a health problem that would prevent you from having lung cancer treatment.  Breast Cancer  Practice breast self-awareness. This means understanding how your breasts normally appear and feel.  It also means doing regular breast self-exams. Let your health care provider know about any changes, no matter how small.  If you are in your 20s  or 30s, you should have a clinical breast exam (CBE) by a health care provider every 1-3 years as part of a regular health exam.  If you are 40 or older, have a CBE every year. Also consider having a breast X-ray (mammogram) every year.  If you have a family history of breast cancer, talk to your health care provider about genetic screening.  If you are at high risk for breast cancer, talk to your health care provider about having an MRI and a mammogram every year.  Breast cancer gene (BRCA) assessment is recommended for women who have family members with BRCA-related cancers. BRCA-related cancers include:  Breast.  Ovarian.  Tubal.  Peritoneal cancers.  Results of the assessment will determine the need for genetic counseling and BRCA1 and BRCA2 testing. Cervical Cancer Routine pelvic examinations to screen for cervical cancer are no longer recommended for nonpregnant women who are considered low risk for cancer of the pelvic organs (ovaries, uterus, and vagina) and who do not have symptoms. A pelvic examination may be necessary if you have symptoms including those associated with pelvic infections. Ask your health care provider if a screening pelvic exam is right for you.   The Pap test is the screening test for cervical cancer for women who are considered at risk.  If you had a hysterectomy for a problem that was not cancer or a condition that could lead to cancer, then you no longer need Pap tests.  If you are older than 65 years, and you have had normal Pap tests for the past 10 years, you no longer need to have Pap tests.  If you have had past treatment for cervical cancer or a condition that could lead to cancer, you need Pap tests and screening for cancer for at least 20 years after your treatment.  If you no longer get a Pap test, assess your risk factors if they change (such as having a new sexual partner). This can affect whether you should start being screened again.  Some  women have medical problems that increase their chance of getting cervical cancer. If this is the case for you, your health care provider may recommend more frequent screening and Pap tests.  The human papillomavirus (HPV) test is another test that may be used for cervical cancer screening. The HPV test looks for the virus that can cause cell changes in the cervix. The cells collected during the Pap test can be tested for HPV.  The HPV test can be used to screen women 30 years of age and older. Getting tested for HPV can extend the interval between normal Pap tests from three to five years.  An HPV test also should be used to screen women of any age who have unclear Pap test results.  After 62 years of age, women should have HPV testing as often as Pap tests.  Colorectal Cancer  This type of cancer can be detected and often prevented.  Routine colorectal cancer screening usually begins at 62 years of age and continues   through 62 years of age.  Your health care provider may recommend screening at an earlier age if you have risk factors for colon cancer.  Your health care provider may also recommend using home test kits to check for hidden blood in the stool.  A small camera at the end of a tube can be used to examine your colon directly (sigmoidoscopy or colonoscopy). This is done to check for the earliest forms of colorectal cancer.  Routine screening usually begins at age 50.  Direct examination of the colon should be repeated every 5-10 years through 62 years of age. However, you may need to be screened more often if early forms of precancerous polyps or small growths are found. Skin Cancer  Check your skin from head to toe regularly.  Tell your health care provider about any new moles or changes in moles, especially if there is a change in a mole's shape or color.  Also tell your health care provider if you have a mole that is larger than the size of a pencil eraser.  Always use  sunscreen. Apply sunscreen liberally and repeatedly throughout the day.  Protect yourself by wearing long sleeves, pants, a wide-brimmed hat, and sunglasses whenever you are outside. HEART DISEASE, DIABETES, AND HIGH BLOOD PRESSURE   Have your blood pressure checked at least every 1-2 years. High blood pressure causes heart disease and increases the risk of stroke.  If you are between 55 years and 79 years old, ask your health care provider if you should take aspirin to prevent strokes.  Have regular diabetes screenings. This involves taking a blood sample to check your fasting blood sugar level.  If you are at a normal weight and have a low risk for diabetes, have this test once every three years after 62 years of age.  If you are overweight and have a high risk for diabetes, consider being tested at a younger age or more often. PREVENTING INFECTION  Hepatitis B  If you have a higher risk for hepatitis B, you should be screened for this virus. You are considered at high risk for hepatitis B if:  You were born in a country where hepatitis B is common. Ask your health care provider which countries are considered high risk.  Your parents were born in a high-risk country, and you have not been immunized against hepatitis B (hepatitis B vaccine).  You have HIV or AIDS.  You use needles to inject street drugs.  You live with someone who has hepatitis B.  You have had sex with someone who has hepatitis B.  You get hemodialysis treatment.  You take certain medicines for conditions, including cancer, organ transplantation, and autoimmune conditions. Hepatitis C  Blood testing is recommended for:  Everyone born from 1945 through 1965.  Anyone with known risk factors for hepatitis C. Sexually transmitted infections (STIs)  You should be screened for sexually transmitted infections (STIs) including gonorrhea and chlamydia if:  You are sexually active and are younger than 62 years of  age.  You are older than 62 years of age and your health care provider tells you that you are at risk for this type of infection.  Your sexual activity has changed since you were last screened and you are at an increased risk for chlamydia or gonorrhea. Ask your health care provider if you are at risk.  If you do not have HIV, but are at risk, it may be recommended that you take a prescription medicine daily   to prevent HIV infection. This is called pre-exposure prophylaxis (PrEP). You are considered at risk if:  You are sexually active and do not regularly use condoms or know the HIV status of your partner(s).  You take drugs by injection.  You are sexually active with a partner who has HIV. Talk with your health care provider about whether you are at high risk of being infected with HIV. If you choose to begin PrEP, you should first be tested for HIV. You should then be tested every 3 months for as long as you are taking PrEP.  PREGNANCY   If you are premenopausal and you may become pregnant, ask your health care provider about preconception counseling.  If you may become pregnant, take 400 to 800 micrograms (mcg) of folic acid every day.  If you want to prevent pregnancy, talk to your health care provider about birth control (contraception). OSTEOPOROSIS AND MENOPAUSE   Osteoporosis is a disease in which the bones lose minerals and strength with aging. This can result in serious bone fractures. Your risk for osteoporosis can be identified using a bone density scan.  If you are 5 years of age or older, or if you are at risk for osteoporosis and fractures, ask your health care provider if you should be screened.  Ask your health care provider whether you should take a calcium or vitamin D supplement to lower your risk for osteoporosis.  Menopause may have certain physical symptoms and risks.  Hormone replacement therapy may reduce some of these symptoms and risks. Talk to your health  care provider about whether hormone replacement therapy is right for you.  HOME CARE INSTRUCTIONS   Schedule regular health, dental, and eye exams.  Stay current with your immunizations.   Do not use any tobacco products including cigarettes, chewing tobacco, or electronic cigarettes.  If you are pregnant, do not drink alcohol.  If you are breastfeeding, limit how much and how often you drink alcohol.  Limit alcohol intake to no more than 1 drink per day for nonpregnant women. One drink equals 12 ounces of beer, 5 ounces of wine, or 1 ounces of hard liquor.  Do not use street drugs.  Do not share needles.  Ask your health care provider for help if you need support or information about quitting drugs.  Tell your health care provider if you often feel depressed.  Tell your health care provider if you have ever been abused or do not feel safe at home. Document Released: 11/08/2010 Document Revised: 09/09/2013 Document Reviewed: 03/27/2013 [facility] Patient Information 2015 [place], [place]. This information is not intended to replace advice given to you by your health care provider. Make sure you discuss any questions you have with your health care provider.

## 2014-08-11 NOTE — Assessment & Plan Note (Signed)
Follows with psyc ([facility]) q33mo for same Denies recent med changes or mood/behavior problems The current medical regimen is effective;  continue present plan and medications.

## 2014-08-13 DIAGNOSIS — Z124 Encounter for screening for malignant neoplasm of cervix: Secondary | ICD-10-CM | POA: Diagnosis not present

## 2014-08-13 DIAGNOSIS — Z6823 Body mass index (BMI) 23.0-23.9, adult: Secondary | ICD-10-CM | POA: Diagnosis not present

## 2014-08-27 DIAGNOSIS — F209 Schizophrenia, unspecified: Secondary | ICD-10-CM | POA: Diagnosis not present

## 2014-08-27 DIAGNOSIS — Z79899 Other long term (current) drug therapy: Secondary | ICD-10-CM | POA: Diagnosis not present

## 2014-09-24 DIAGNOSIS — Z79899 Other long term (current) drug therapy: Secondary | ICD-10-CM | POA: Diagnosis not present

## 2014-09-24 DIAGNOSIS — F209 Schizophrenia, unspecified: Secondary | ICD-10-CM | POA: Diagnosis not present

## 2014-09-30 ENCOUNTER — Ambulatory Visit
Admission: RE | Admit: 2014-09-30 | Discharge: 2014-09-30 | Disposition: A | Discharge status: Home / Self Care | Payer: Medicare Other | Source: Ambulatory Visit | Attending: Internal Medicine | Admitting: Internal Medicine

## 2014-09-30 DIAGNOSIS — M810 Age-related osteoporosis without current pathological fracture: Secondary | ICD-10-CM

## 2014-10-22 DIAGNOSIS — F209 Schizophrenia, unspecified: Secondary | ICD-10-CM | POA: Diagnosis not present

## 2014-10-22 DIAGNOSIS — Z79899 Other long term (current) drug therapy: Secondary | ICD-10-CM | POA: Diagnosis not present

## 2014-11-19 DIAGNOSIS — F209 Schizophrenia, unspecified: Secondary | ICD-10-CM | POA: Diagnosis not present

## 2014-11-19 DIAGNOSIS — Z79899 Other long term (current) drug therapy: Secondary | ICD-10-CM | POA: Diagnosis not present

## 2014-12-09 DIAGNOSIS — N762 Acute vulvitis: Secondary | ICD-10-CM | POA: Diagnosis not present

## 2014-12-17 DIAGNOSIS — Z79899 Other long term (current) drug therapy: Secondary | ICD-10-CM | POA: Diagnosis not present

## 2014-12-17 DIAGNOSIS — F209 Schizophrenia, unspecified: Secondary | ICD-10-CM | POA: Diagnosis not present

## 2015-01-14 DIAGNOSIS — Z79899 Other long term (current) drug therapy: Secondary | ICD-10-CM | POA: Diagnosis not present

## 2015-01-14 DIAGNOSIS — F209 Schizophrenia, unspecified: Secondary | ICD-10-CM | POA: Diagnosis not present

## 2015-02-11 ENCOUNTER — Encounter: Payer: Self-pay | Admitting: Internal Medicine

## 2015-02-11 DIAGNOSIS — Z79899 Other long term (current) drug therapy: Secondary | ICD-10-CM | POA: Diagnosis not present

## 2015-02-11 DIAGNOSIS — F209 Schizophrenia, unspecified: Secondary | ICD-10-CM | POA: Diagnosis not present

## 2015-03-11 DIAGNOSIS — F209 Schizophrenia, unspecified: Secondary | ICD-10-CM | POA: Diagnosis not present

## 2015-03-11 DIAGNOSIS — Z79899 Other long term (current) drug therapy: Secondary | ICD-10-CM | POA: Diagnosis not present

## 2015-04-08 DIAGNOSIS — F209 Schizophrenia, unspecified: Secondary | ICD-10-CM | POA: Diagnosis not present

## 2015-04-08 DIAGNOSIS — Z79899 Other long term (current) drug therapy: Secondary | ICD-10-CM | POA: Diagnosis not present

## 2015-04-16 ENCOUNTER — Telehealth: Payer: Self-pay | Admitting: Internal Medicine

## 2015-04-16 ENCOUNTER — Encounter: Payer: Self-pay | Admitting: Internal Medicine

## 2015-04-16 NOTE — Telephone Encounter (Signed)
Patient would like updated status on forms she dropped off

## 2015-04-17 NOTE — Telephone Encounter (Signed)
Do you have the DMV form for this pt?

## 2015-04-23 DIAGNOSIS — Z7689 Persons encountering health services in other specified circumstances: Secondary | ICD-10-CM

## 2015-04-23 NOTE — Telephone Encounter (Signed)
Called [facility], [name] (pt sister) and informed that form is ready for pick up.

## 2015-05-06 DIAGNOSIS — Z79899 Other long term (current) drug therapy: Secondary | ICD-10-CM | POA: Diagnosis not present

## 2015-05-06 DIAGNOSIS — F209 Schizophrenia, unspecified: Secondary | ICD-10-CM | POA: Diagnosis not present

## 2015-05-19 DIAGNOSIS — Z961 Presence of intraocular lens: Secondary | ICD-10-CM | POA: Diagnosis not present

## 2015-05-19 DIAGNOSIS — H524 Presbyopia: Secondary | ICD-10-CM | POA: Diagnosis not present

## 2015-05-21 ENCOUNTER — Other Ambulatory Visit (INDEPENDENT_AMBULATORY_CARE_PROVIDER_SITE_OTHER): Payer: Medicare Other

## 2015-05-21 ENCOUNTER — Ambulatory Visit (INDEPENDENT_AMBULATORY_CARE_PROVIDER_SITE_OTHER): Payer: Medicare Other | Admitting: Internal Medicine

## 2015-05-21 ENCOUNTER — Encounter: Payer: Self-pay | Admitting: Internal Medicine

## 2015-05-21 VITALS — BP 106/60 | HR 107 | Temp 98.3°F | Resp 12 | Ht 61.5 in | Wt 119.0 lb

## 2015-05-21 DIAGNOSIS — Z0000 Encounter for general adult medical examination without abnormal findings: Secondary | ICD-10-CM

## 2015-05-21 DIAGNOSIS — K224 Dyskinesia of esophagus: Secondary | ICD-10-CM

## 2015-05-21 DIAGNOSIS — E785 Hyperlipidemia, unspecified: Secondary | ICD-10-CM | POA: Diagnosis not present

## 2015-05-21 LAB — COMPREHENSIVE METABOLIC PANEL
ALBUMIN: 4.3 g/dL (ref 3.5–5.2)
ALT: 12 U/L (ref 0–35)
AST: 15 U/L (ref 0–37)
Alkaline Phosphatase: 101 U/L (ref 39–117)
BUN: 19 mg/dL (ref 6–23)
CO2: 35 mEq/L — ABNORMAL HIGH (ref 19–32)
CREATININE: 0.87 mg/dL (ref 0.40–1.20)
Calcium: 9.6 mg/dL (ref 8.4–10.5)
Chloride: 102 mEq/L (ref 96–112)
GFR: 70.06 mL/min (ref >60.00)
Glucose, Bld: 93 mg/dL (ref 70–99)
Potassium: 4.2 mEq/L (ref 3.5–5.1)
Sodium: 141 mEq/L (ref 135–145)
Total Bilirubin: 0.3 mg/dL (ref 0.2–1.2)
Total Protein: 6.2 g/dL (ref 6.0–8.3)

## 2015-05-21 LAB — LIPID PANEL
CHOLESTEROL: 146 mg/dL (ref 0–200)
HDL: 57.8 mg/dL (ref >39.00)
LDL CALC: 64 mg/dL (ref 0–99)
NONHDL: 88.1
Total CHOL/HDL Ratio: 3
Triglycerides: 122 mg/dL (ref 0.0–149.0)
VLDL: 24.4 mg/dL (ref 0.0–40.0)

## 2015-05-21 LAB — CBC
HEMATOCRIT: 40.2 % (ref 36.0–46.0)
Hemoglobin: 13.2 g/dL (ref 12.0–15.0)
MCHC: 32.8 g/dL (ref 30.0–36.0)
MCV: 87.7 fl (ref 78.0–100.0)
PLATELETS: 219 10*3/uL (ref 150.0–400.0)
RBC: 4.59 Mil/uL (ref 3.87–5.11)
RDW: 13.3 % (ref 11.5–15.5)
WBC: 7 10*3/uL (ref 4.0–10.5)

## 2015-05-21 LAB — HEMOGLOBIN A1C: HEMOGLOBIN A1C: 5.6 % (ref 4.6–6.5)

## 2015-05-21 NOTE — Progress Notes (Signed)
Pre visit review using our clinic review tool, if applicable. No additional management support is needed unless otherwise documented below in the visit note. 

## 2015-05-21 NOTE — Patient Instructions (Signed)
We have filled out the papers and will check the blood work today.  Come back in about 1 year for a check up or sooner if you have any problems or questions.   Health Maintenance, Female Adopting a healthy lifestyle and getting preventive care can go a long way to promote health and wellness. Talk with your health care provider about what schedule of regular examinations is right for you. This is a good chance for you to check in with your provider about disease prevention and staying healthy. In between checkups, there are plenty of things you can do on your own. Experts have done a lot of research about which lifestyle changes and preventive measures are most likely to keep you healthy. Ask your health care provider for more information. WEIGHT AND DIET  Eat a healthy diet  Be sure to include plenty of vegetables, fruits, low-fat dairy products, and lean protein.  Do not eat a lot of foods high in solid fats, added sugars, or salt.  Get regular exercise. This is one of the most important things you can do for your health.  Most adults should exercise for at least 150 minutes each week. The exercise should increase your heart rate and make you sweat (moderate-intensity exercise).  Most adults should also do strengthening exercises at least twice a week. This is in addition to the moderate-intensity exercise.  Maintain a healthy weight  Body mass index (BMI) is a measurement that can be used to identify possible weight problems. It estimates body fat based on height and weight. Your health care provider can help determine your BMI and help you achieve or maintain a healthy weight.  For females 81 years of age and older:   A BMI below 18.5 is considered underweight.  A BMI of 18.5 to 24.9 is normal.  A BMI of 25 to 29.9 is considered overweight.  A BMI of 30 and above is considered obese.  Watch levels of cholesterol and blood lipids  You should start having your blood tested for  lipids and cholesterol at 63 years of age, then have this test every 5 years.  You may need to have your cholesterol levels checked more often if:  Your lipid or cholesterol levels are high.  You are older than 63 years of age.  You are at high risk for heart disease.  CANCER SCREENING   Lung Cancer  Lung cancer screening is recommended for adults 70-83 years old who are at high risk for lung cancer because of a history of smoking.  A yearly low-dose CT scan of the lungs is recommended for people who:  Currently smoke.  Have quit within the past 15 years.  Have at least a 30-pack-year history of smoking. A pack year is smoking an average of one pack of cigarettes a day for 1 year.  Yearly screening should continue until it has been 15 years since you quit.  Yearly screening should stop if you develop a health problem that would prevent you from having lung cancer treatment.  Breast Cancer  Practice breast self-awareness. This means understanding how your breasts normally appear and feel.  It also means doing regular breast self-exams. Let your health care provider know about any changes, no matter how small.  If you are in your 20s or 30s, you should have a clinical breast exam (CBE) by a health care provider every 1-3 years as part of a regular health exam.  If you are 40 or older, have  a CBE every year. Also consider having a breast X-ray (mammogram) every year.  If you have a family history of breast cancer, talk to your health care provider about genetic screening.  If you are at high risk for breast cancer, talk to your health care provider about having an MRI and a mammogram every year.  Breast cancer gene (BRCA) assessment is recommended for women who have family members with BRCA-related cancers. BRCA-related cancers include:  Breast.  Ovarian.  Tubal.  Peritoneal cancers.  Results of the assessment will determine the need for genetic counseling and BRCA1  and BRCA2 testing. Cervical Cancer Your health care provider may recommend that you be screened regularly for cancer of the pelvic organs (ovaries, uterus, and vagina). This screening involves a pelvic examination, including checking for microscopic changes to the surface of your cervix (Pap test). You may be encouraged to have this screening done every 3 years, beginning at age 13.  For women ages 66-65, health care providers may recommend pelvic exams and Pap testing every 3 years, or they may recommend the Pap and pelvic exam, combined with testing for human papilloma virus (HPV), every 5 years. Some types of HPV increase your risk of cervical cancer. Testing for HPV may also be done on women of any age with unclear Pap test results.  Other health care providers may not recommend any screening for nonpregnant women who are considered low risk for pelvic cancer and who do not have symptoms. Ask your health care provider if a screening pelvic exam is right for you.  If you have had past treatment for cervical cancer or a condition that could lead to cancer, you need Pap tests and screening for cancer for at least 20 years after your treatment. If Pap tests have been discontinued, your risk factors (such as having a new sexual partner) need to be reassessed to determine if screening should resume. Some women have medical problems that increase the chance of getting cervical cancer. In these cases, your health care provider may recommend more frequent screening and Pap tests. Colorectal Cancer  This type of cancer can be detected and often prevented.  Routine colorectal cancer screening usually begins at 63 years of age and continues through 63 years of age.  Your health care provider may recommend screening at an earlier age if you have risk factors for colon cancer.  Your health care provider may also recommend using home test kits to check for hidden blood in the stool.  A small camera at the  end of a tube can be used to examine your colon directly (sigmoidoscopy or colonoscopy). This is done to check for the earliest forms of colorectal cancer.  Routine screening usually begins at age 48.  Direct examination of the colon should be repeated every 5-10 years through 63 years of age. However, you may need to be screened more often if early forms of precancerous polyps or small growths are found. Skin Cancer  Check your skin from head to toe regularly.  Tell your health care provider about any new moles or changes in moles, especially if there is a change in a mole's shape or color.  Also tell your health care provider if you have a mole that is larger than the size of a pencil eraser.  Always use sunscreen. Apply sunscreen liberally and repeatedly throughout the day.  Protect yourself by wearing long sleeves, pants, a wide-brimmed hat, and sunglasses whenever you are outside. HEART DISEASE, DIABETES, AND HIGH  BLOOD PRESSURE   High blood pressure causes heart disease and increases the risk of stroke. High blood pressure is more likely to develop in:  People who have blood pressure in the high end of the normal range (130-139/85-89 mm Hg).  People who are overweight or obese.  People who are African American.  If you are 18-39 years of age, have your blood pressure checked every 3-5 years. If you are 40 years of age or older, have your blood pressure checked every year. You should have your blood pressure measured twice--once when you are at a hospital or clinic, and once when you are not at a hospital or clinic. Record the average of the two measurements. To check your blood pressure when you are not at a hospital or clinic, you can use:  An automated blood pressure machine at a pharmacy.  A home blood pressure monitor.  If you are between 55 years and 79 years old, ask your health care provider if you should take aspirin to prevent strokes.  Have regular diabetes  screenings. This involves taking a blood sample to check your fasting blood sugar level.  If you are at a normal weight and have a low risk for diabetes, have this test once every three years after 63 years of age.  If you are overweight and have a high risk for diabetes, consider being tested at a younger age or more often. PREVENTING INFECTION  Hepatitis B  If you have a higher risk for hepatitis B, you should be screened for this virus. You are considered at high risk for hepatitis B if:  You were born in a country where hepatitis B is common. Ask your health care provider which countries are considered high risk.  Your parents were born in a high-risk country, and you have not been immunized against hepatitis B (hepatitis B vaccine).  You have HIV or AIDS.  You use needles to inject street drugs.  You live with someone who has hepatitis B.  You have had sex with someone who has hepatitis B.  You get hemodialysis treatment.  You take certain medicines for conditions, including cancer, organ transplantation, and autoimmune conditions. Hepatitis C  Blood testing is recommended for:  Everyone born from 1945 through 1965.  Anyone with known risk factors for hepatitis C. Sexually transmitted infections (STIs)  You should be screened for sexually transmitted infections (STIs) including gonorrhea and chlamydia if:  You are sexually active and are younger than 63 years of age.  You are older than 63 years of age and your health care provider tells you that you are at risk for this type of infection.  Your sexual activity has changed since you were last screened and you are at an increased risk for chlamydia or gonorrhea. Ask your health care provider if you are at risk.  If you do not have HIV, but are at risk, it may be recommended that you take a prescription medicine daily to prevent HIV infection. This is called pre-exposure prophylaxis (PrEP). You are considered at risk  if:  You are sexually active and do not regularly use condoms or know the HIV status of your partner(s).  You take drugs by injection.  You are sexually active with a partner who has HIV. Talk with your health care provider about whether you are at high risk of being infected with HIV. If you choose to begin PrEP, you should first be tested for HIV. You should then be tested every   3 months for as long as you are taking PrEP.  PREGNANCY   If you are premenopausal and you may become pregnant, ask your health care provider about preconception counseling.  If you may become pregnant, take 400 to 800 micrograms (mcg) of folic acid every day.  If you want to prevent pregnancy, talk to your health care provider about birth control (contraception). OSTEOPOROSIS AND MENOPAUSE   Osteoporosis is a disease in which the bones lose minerals and strength with aging. This can result in serious bone fractures. Your risk for osteoporosis can be identified using a bone density scan.  If you are 55 years of age or older, or if you are at risk for osteoporosis and fractures, ask your health care provider if you should be screened.  Ask your health care provider whether you should take a calcium or vitamin D supplement to lower your risk for osteoporosis.  Menopause may have certain physical symptoms and risks.  Hormone replacement therapy may reduce some of these symptoms and risks. Talk to your health care provider about whether hormone replacement therapy is right for you.  HOME CARE INSTRUCTIONS   Schedule regular health, dental, and eye exams.  Stay current with your immunizations.   Do not use any tobacco products including cigarettes, chewing tobacco, or electronic cigarettes.  If you are pregnant, do not drink alcohol.  If you are breastfeeding, limit how much and how often you drink alcohol.  Limit alcohol intake to no more than 1 drink per day for nonpregnant women. One drink equals 12  ounces of beer, 5 ounces of wine, or 1 ounces of hard liquor.  Do not use street drugs.  Do not share needles.  Ask your health care provider for help if you need support or information about quitting drugs.  Tell your health care provider if you often feel depressed.  Tell your health care provider if you have ever been abused or do not feel safe at home.   This information is not intended to replace advice given to you by your health care provider. Make sure you discuss any questions you have with your health care provider.   Document Released: 11/08/2010 Document Revised: 05/16/2014 Document Reviewed: 03/27/2013 Elsevier Interactive Patient Education [employer].

## 2015-05-22 ENCOUNTER — Encounter: Payer: Self-pay | Admitting: Internal Medicine

## 2015-05-22 DIAGNOSIS — Z0000 Encounter for general adult medical examination without abnormal findings: Secondary | ICD-10-CM | POA: Insufficient documentation

## 2015-05-22 NOTE — Assessment & Plan Note (Signed)
Checking labs, reminded her she is due for pap smear. Immunizations up to date including flu. Needs mammogram as well but she declines today. Filled out form for DMV. Adjust as needed. Counseled her on home safety and fall prevention. Given 10 year screening recommendations.

## 2015-05-22 NOTE — Progress Notes (Signed)
   Subjective:    Patient ID: Nancy Brooks, female    DOB: November 04, 1950, 65 y.o.   MRN: [id number]  HPI Here for medicare wellness, no new complaints. Please see A/P for status and treatment of chronic medical problems.   Diet: heart healthy Physical activity: sedentary Depression/mood screen: negative Hearing: intact to whispered voice Visual acuity: grossly normal, performs annual eye exam  ADLs: capable with help from her sister for some IADLs Fall risk: low Home safety: good Cognitive evaluation: intact to orientation, naming, recall and repetition EOL planning: adv directives discussed  I have personally reviewed and have noted 1. The patient's medical and social history - reviewed today no changes 2. Their use of alcohol, tobacco or illicit drugs 3. Their current medications and supplements 4. The patient's functional ability including ADL's, fall risks, home safety risks and hearing or visual impairment. 5. Diet and physical activities 6. Evidence for depression or mood disorders 7. Care team reviewed and updated (available in snapshot)  Review of Systems  Constitutional: Negative for fever, activity change, appetite change, fatigue and unexpected weight change.  HENT: Negative.   Eyes: Negative.   Respiratory: Negative for cough, chest tightness, shortness of breath and wheezing.   Cardiovascular: Negative for chest pain, palpitations and leg swelling.  Gastrointestinal: Negative for nausea, abdominal pain, diarrhea, constipation and abdominal distention.  Musculoskeletal: Negative.   Skin: Negative.   Neurological: Negative.   Psychiatric/Behavioral: Negative.       Objective:   Physical Exam  Constitutional: She is oriented to person, place, and time. She appears well-developed and well-nourished.  HENT:  Head: Normocephalic and atraumatic.  Eyes: EOM are normal.  Neck: Normal range of motion.  Cardiovascular: Normal rate and regular rhythm.     Pulmonary/Chest: Effort normal and breath sounds normal. No respiratory distress. She has no wheezes. She has no rales.  Abdominal: Soft. Bowel sounds are normal. She exhibits no distension. There is no tenderness. There is no rebound.  Musculoskeletal: She exhibits no edema.  Neurological: She is alert and oriented to person, place, and time. Coordination normal.  Skin: Skin is warm and dry.  Psychiatric:  Some affect difference   Filed Vitals:   05/21/15 1605  BP: 106/60  Pulse: 107  Temp: 98.3 F (36.8 C)  TempSrc: Oral  Resp: 12  Height: 5' 1.5" (1.562 m)  Weight: 119 lb (53.978 kg)  SpO2: 98%      Assessment & Plan:

## 2015-05-22 NOTE — Assessment & Plan Note (Signed)
Checking lipids and adjust as needed. On simvastatin daily without side effects.

## 2015-05-22 NOTE — Assessment & Plan Note (Signed)
Taking calcium channel blocker and doing well. Still rare episodes but much improved.

## 2015-06-03 DIAGNOSIS — F209 Schizophrenia, unspecified: Secondary | ICD-10-CM | POA: Diagnosis not present

## 2015-06-03 DIAGNOSIS — Z79899 Other long term (current) drug therapy: Secondary | ICD-10-CM | POA: Diagnosis not present

## 2015-06-29 ENCOUNTER — Telehealth: Payer: Self-pay | Admitting: Gastroenterology

## 2015-06-29 ENCOUNTER — Other Ambulatory Visit: Payer: Self-pay

## 2015-06-29 DIAGNOSIS — Z1231 Encounter for screening mammogram for malignant neoplasm of breast: Secondary | ICD-10-CM

## 2015-06-29 NOTE — Telephone Encounter (Signed)
Per Irwin Brakeman, patient had a letter mailed on 05/30/15 after Dr. Havery Moros reviewed her record. Spoke with patient's sister and told her we can schedule her sister for colonoscopy. She states they may go else where but wants to know when it could be done. Gave her 07/08/15. She will call back if she decides to schedule. She is aware it may not be available later.

## 2015-07-01 DIAGNOSIS — Z79899 Other long term (current) drug therapy: Secondary | ICD-10-CM | POA: Diagnosis not present

## 2015-07-01 DIAGNOSIS — F209 Schizophrenia, unspecified: Secondary | ICD-10-CM | POA: Diagnosis not present

## 2015-07-14 ENCOUNTER — Ambulatory Visit
Admission: RE | Admit: 2015-07-14 | Discharge: 2015-07-14 | Disposition: A | Discharge status: Home / Self Care | Payer: Medicare Other | Source: Ambulatory Visit

## 2015-07-14 DIAGNOSIS — Z1231 Encounter for screening mammogram for malignant neoplasm of breast: Secondary | ICD-10-CM | POA: Diagnosis not present

## 2015-07-27 ENCOUNTER — Other Ambulatory Visit: Payer: Self-pay | Admitting: Internal Medicine

## 2015-07-29 DIAGNOSIS — F209 Schizophrenia, unspecified: Secondary | ICD-10-CM | POA: Diagnosis not present

## 2015-07-29 DIAGNOSIS — Z79899 Other long term (current) drug therapy: Secondary | ICD-10-CM | POA: Diagnosis not present

## 2015-08-02 ENCOUNTER — Other Ambulatory Visit: Payer: Self-pay | Admitting: Internal Medicine

## 2015-08-06 ENCOUNTER — Encounter: Payer: Self-pay | Admitting: Gastroenterology

## 2015-08-18 ENCOUNTER — Encounter: Payer: Medicare Other | Admitting: Internal Medicine

## 2015-08-24 ENCOUNTER — Other Ambulatory Visit: Payer: Self-pay | Admitting: Internal Medicine

## 2015-08-26 DIAGNOSIS — F209 Schizophrenia, unspecified: Secondary | ICD-10-CM | POA: Diagnosis not present

## 2015-08-26 DIAGNOSIS — Z79899 Other long term (current) drug therapy: Secondary | ICD-10-CM | POA: Diagnosis not present

## 2015-09-17 ENCOUNTER — Other Ambulatory Visit: Payer: Self-pay | Admitting: Internal Medicine

## 2015-09-21 ENCOUNTER — Other Ambulatory Visit: Payer: Self-pay | Admitting: Internal Medicine

## 2015-09-23 ENCOUNTER — Other Ambulatory Visit: Payer: Self-pay | Admitting: Internal Medicine

## 2015-09-23 ENCOUNTER — Ambulatory Visit (AMBULATORY_SURGERY_CENTER): Payer: Self-pay

## 2015-09-23 VITALS — Ht 61.5 in | Wt 119.0 lb

## 2015-09-23 DIAGNOSIS — Z79899 Other long term (current) drug therapy: Secondary | ICD-10-CM | POA: Diagnosis not present

## 2015-09-23 DIAGNOSIS — F209 Schizophrenia, unspecified: Secondary | ICD-10-CM | POA: Diagnosis not present

## 2015-09-23 DIAGNOSIS — Z800 Family history of malignant neoplasm of digestive organs: Secondary | ICD-10-CM

## 2015-09-23 MED ORDER — NA SULFATE-K SULFATE-MG SULF 17.5-3.13-1.6 GM/177ML PO SOLN: 1.0000 | Freq: Once | ORAL | Status: DC

## 2015-09-23 NOTE — Progress Notes (Signed)
No egg or soy allergies Not on home 02 No previous anesthesia complications No diet or weight loss meds 2 day prep added at care providers request

## 2015-09-24 DIAGNOSIS — Z124 Encounter for screening for malignant neoplasm of cervix: Secondary | ICD-10-CM | POA: Diagnosis not present

## 2015-09-24 DIAGNOSIS — Z6822 Body mass index (BMI) 22.0-22.9, adult: Secondary | ICD-10-CM | POA: Diagnosis not present

## 2015-09-24 DIAGNOSIS — Z01419 Encounter for gynecological examination (general) (routine) without abnormal findings: Secondary | ICD-10-CM | POA: Diagnosis not present

## 2015-09-25 ENCOUNTER — Encounter: Payer: Self-pay | Admitting: Gastroenterology

## 2015-10-08 ENCOUNTER — Ambulatory Visit (AMBULATORY_SURGERY_CENTER): Payer: Medicare Other | Admitting: Gastroenterology

## 2015-10-08 ENCOUNTER — Encounter: Payer: Self-pay | Admitting: Gastroenterology

## 2015-10-08 VITALS — BP 110/74 | HR 95 | Temp 98.4°F | Resp 11 | Ht 61.0 in | Wt 119.0 lb

## 2015-10-08 DIAGNOSIS — Z800 Family history of malignant neoplasm of digestive organs: Secondary | ICD-10-CM | POA: Diagnosis not present

## 2015-10-08 DIAGNOSIS — Z86010 Personal history of colonic polyps: Secondary | ICD-10-CM | POA: Diagnosis present

## 2015-10-08 DIAGNOSIS — D123 Benign neoplasm of transverse colon: Secondary | ICD-10-CM

## 2015-10-08 DIAGNOSIS — D122 Benign neoplasm of ascending colon: Secondary | ICD-10-CM

## 2015-10-08 MED ORDER — SODIUM CHLORIDE 0.9 % IV SOLN: 500.0000 mL | INTRAVENOUS | Status: DC

## 2015-10-08 NOTE — Op Note (Signed)
[facility] Patient Name: Nancy Brooks Procedure Date: 10/08/2015 11:13 AM MRN: [id number] Endoscopist: Remo Lipps P. Havery Moros , MD Age: 65 Referring MD:  Date of Birth: 09/04/1950 Gender: Female Procedure:                Colonoscopy Indications:              Screening in patient at increased risk: Family                            history of 1st-degree relative with colorectal                            cancer before age 84 years Medicines:                Monitored Anesthesia Care Procedure:                Pre-Anesthesia Assessment:                           - Prior to the procedure, a History and Physical                            was performed, and patient medications and                            allergies were reviewed. The patient's tolerance of                            previous anesthesia was also reviewed. The risks                            and benefits of the procedure and the sedation                            options and risks were discussed with the patient.                            All questions were answered, and informed consent                            was obtained. Prior Anticoagulants: The patient has                            taken aspirin, last dose was 1 day prior to                            procedure. ASA Grade Assessment: III - A patient                            with severe systemic disease. After reviewing the                            risks and benefits, the patient was deemed in  satisfactory condition to undergo the procedure.                           After obtaining informed consent, the colonoscope                            was passed under direct vision. Throughout the                            procedure, the patient's blood pressure, pulse, and                            oxygen saturations were monitored continuously. The                            Model PCF-[id number]) scope was introduced                            through the anus and advanced to the the cecum,                            identified by appendiceal orifice and ileocecal                            valve. The colonoscopy was performed without                            difficulty. The patient tolerated the procedure                            well. The quality of the bowel preparation was                            good. The ileocecal valve, appendiceal orifice, and                            rectum were photographed. Scope In: 11:19:32 AM Scope Out: 11:34:01 AM Scope Withdrawal Time: 0 hours 11 minutes 48 seconds  Total Procedure Duration: 0 hours 14 minutes 29 seconds  Findings:                 The perianal exam findings include non-thrombosed                            external hemorrhoids.                           A 8 mm polyp was found in the ascending colon. The                            polyp was flat. The polyp was removed with a cold                            snare. Resection and retrieval were complete.  A 6 mm polyp was found in the transverse colon. The                            polyp was flat. The polyp was removed with a cold                            snare. Resection and retrieval were complete.                           A few small-mouthed diverticula were found in the                            left colon.                           Non-bleeding internal hemorrhoids were found during                            retroflexion.                           The exam was otherwise without abnormality. Complications:            No immediate complications. Estimated blood loss:                            Minimal. Estimated Blood Loss:     Estimated blood loss was minimal. Impression:               - Non-thrombosed external hemorrhoids found on                            perianal exam.                           - One 8 mm polyp in the ascending colon, removed                             with a cold snare. Resected and retrieved.                           - One 6 mm polyp in the transverse colon, removed                            with a cold snare. Resected and retrieved.                           - Diverticulosis in the left colon.                           - Non-bleeding internal hemorrhoids.                           - The examination was otherwise normal. Recommendation:           - Patient has a contact number available for  emergencies. The signs and symptoms of potential                            delayed complications were discussed with the                            patient. Return to normal activities tomorrow.                            Written discharge instructions were provided to the                            patient.                           - Resume previous diet.                           - Continue present medications.                           - No ibuprofen, naproxen, or other non-steroidal                            anti-inflammatory drugs for 2 weeks after polyp                            removal.                           - Await pathology results.                           - Repeat colonoscopy is recommended for                            surveillance. The colonoscopy date will be                            determined after pathology results from today's                            exam become available for review. Remo Lipps P. Armbruster, MD 10/08/2015 11:39:25 AM This report has been signed electronically.

## 2015-10-08 NOTE — Progress Notes (Signed)
Report to PACU, RN, vss, BBS= Clear.  

## 2015-10-08 NOTE — Patient Instructions (Signed)
Impression/recommendations:  Polyps (handout given) Diverticulosis (handout given) High Fiber Diet (handout given) Hemorrhoids (handout given)  No aspirin, aspirin containing products or NSAIDS for two weeks. Tylenol only if needed until 10/23/15.  YOU HAD AN ENDOSCOPIC PROCEDURE TODAY AT [facility]:   Refer to the procedure report that was given to you for any specific questions about what was found during the examination.  If the procedure report does not answer your questions, please call your gastroenterologist to clarify.  If you requested that your care partner not be given the details of your procedure findings, then the procedure report has been included in a sealed envelope for you to review at your convenience later.  YOU SHOULD EXPECT: Some feelings of bloating in the abdomen. Passage of more gas than usual.  Walking can help get rid of the air that was put into your GI tract during the procedure and reduce the bloating. If you had a lower endoscopy (such as a colonoscopy or flexible sigmoidoscopy) you may notice spotting of blood in your stool or on the toilet paper. If you underwent a bowel prep for your procedure, you may not have a normal bowel movement for a few days.  Please Note:  You might notice some irritation and congestion in your nose or some drainage.  This is from the oxygen used during your procedure.  There is no need for concern and it should clear up in a day or so.  SYMPTOMS TO REPORT IMMEDIATELY:   Following lower endoscopy (colonoscopy or flexible sigmoidoscopy):  Excessive amounts of blood in the stool  Significant tenderness or worsening of abdominal pains  Swelling of the abdomen that is new, acute  Fever of 100F or higher   For urgent or emergent issues, a gastroenterologist can be reached at any hour by calling [phone].   DIET: Your first meal following the procedure should be a small meal and then it is ok to progress to your  normal diet. Heavy or fried foods are harder to digest and may make you feel nauseous or bloated.  Likewise, meals heavy in dairy and vegetables can increase bloating.  Drink plenty of fluids but you should avoid alcoholic beverages for 24 hours.  ACTIVITY:  You should plan to take it easy for the rest of today and you should NOT DRIVE or use heavy machinery until tomorrow (because of the sedation medicines used during the test).    FOLLOW UP: Our staff will call the number listed on your records the next business day following your procedure to check on you and address any questions or concerns that you may have regarding the information given to you following your procedure. If we do not reach you, we will leave a message.  However, if you are feeling well and you are not experiencing any problems, there is no need to return our call.  We will assume that you have returned to your regular daily activities without incident.  If any biopsies were taken you will be contacted by phone or by letter within the next 1-3 weeks.  Please call us at [phone] if you have not heard about the biopsies in 3 weeks.    SIGNATURES/CONFIDENTIALITY: You and/or your care partner have signed paperwork which will be entered into your electronic medical record.  These signatures attest to the fact that that the information above on your After Visit Summary has been reviewed and is understood.  Full responsibility of the confidentiality of  this discharge information lies with you and/or your care-partner.

## 2015-10-08 NOTE — Progress Notes (Signed)
Pt was not sure about her the last day she took her meds.  She thought it was yesterday or Tuesday.  I asked the pt if I could bring her sister back into admitting to help with some of her medical questions.  Pt said, "Yes".  Sister brought back and she assisted pt with answering her medical history and family history information. maw

## 2015-10-08 NOTE — Progress Notes (Signed)
Called to room to assist during endoscopic procedure.  Patient ID and intended procedure confirmed with present staff. Received instructions for my participation in the procedure from the performing physician.  

## 2015-10-09 ENCOUNTER — Telehealth: Payer: Self-pay | Admitting: *Deleted

## 2015-10-09 NOTE — Telephone Encounter (Signed)
  Follow up Call-  Call back number 10/08/2015  Post procedure Call Back phone  # [phone] hm  Permission to leave phone message No  comments pt does not have an answering machine     Patient questions:  Do you have a fever, pain , or abdominal swelling? No. Pain Score  0 *  Have you tolerated food without any problems? Yes.    Have you been able to return to your normal activities? Yes.    Do you have any questions about your discharge instructions: Diet   No. Medications  No. Follow up visit  No.  Do you have questions or concerns about your Care? No.  Actions: * If pain score is 4 or above: No action needed, pain <4.

## 2015-10-14 ENCOUNTER — Encounter: Payer: Self-pay | Admitting: Gastroenterology

## 2015-10-19 ENCOUNTER — Other Ambulatory Visit: Payer: Self-pay | Admitting: Internal Medicine

## 2015-10-21 DIAGNOSIS — F209 Schizophrenia, unspecified: Secondary | ICD-10-CM | POA: Diagnosis not present

## 2015-10-21 DIAGNOSIS — Z79899 Other long term (current) drug therapy: Secondary | ICD-10-CM | POA: Diagnosis not present

## 2015-11-02 ENCOUNTER — Other Ambulatory Visit: Payer: Self-pay | Admitting: Internal Medicine

## 2015-11-18 DIAGNOSIS — Z79899 Other long term (current) drug therapy: Secondary | ICD-10-CM | POA: Diagnosis not present

## 2015-11-18 DIAGNOSIS — F209 Schizophrenia, unspecified: Secondary | ICD-10-CM | POA: Diagnosis not present

## 2015-12-16 DIAGNOSIS — Z79899 Other long term (current) drug therapy: Secondary | ICD-10-CM | POA: Diagnosis not present

## 2015-12-16 DIAGNOSIS — F209 Schizophrenia, unspecified: Secondary | ICD-10-CM | POA: Diagnosis not present

## 2016-01-13 DIAGNOSIS — Z79899 Other long term (current) drug therapy: Secondary | ICD-10-CM | POA: Diagnosis not present

## 2016-01-24 ENCOUNTER — Encounter: Payer: Self-pay | Admitting: Student

## 2016-02-01 ENCOUNTER — Other Ambulatory Visit: Payer: Self-pay | Admitting: Internal Medicine

## 2016-02-10 DIAGNOSIS — Z79899 Other long term (current) drug therapy: Secondary | ICD-10-CM | POA: Diagnosis not present

## 2016-05-03 ENCOUNTER — Other Ambulatory Visit: Payer: Self-pay | Admitting: Internal Medicine

## 2016-05-30 ENCOUNTER — Other Ambulatory Visit: Payer: Self-pay | Admitting: Internal Medicine

## 2016-06-07 ENCOUNTER — Encounter: Payer: Medicare Other | Admitting: Internal Medicine

## 2016-07-08 ENCOUNTER — Other Ambulatory Visit: Payer: Self-pay | Admitting: Internal Medicine

## 2016-07-08 DIAGNOSIS — Z1231 Encounter for screening mammogram for malignant neoplasm of breast: Secondary | ICD-10-CM

## 2016-07-11 ENCOUNTER — Other Ambulatory Visit: Payer: Self-pay | Admitting: Internal Medicine

## 2016-08-04 ENCOUNTER — Encounter: Payer: Self-pay | Admitting: Internal Medicine

## 2016-08-04 ENCOUNTER — Other Ambulatory Visit (INDEPENDENT_AMBULATORY_CARE_PROVIDER_SITE_OTHER): Payer: Medicare Other

## 2016-08-04 ENCOUNTER — Ambulatory Visit (INDEPENDENT_AMBULATORY_CARE_PROVIDER_SITE_OTHER): Payer: Medicare Other | Admitting: Internal Medicine

## 2016-08-04 VITALS — BP 120/68 | HR 112 | Temp 97.9°F | Resp 12 | Ht 61.0 in | Wt 112.0 lb

## 2016-08-04 DIAGNOSIS — E785 Hyperlipidemia, unspecified: Secondary | ICD-10-CM

## 2016-08-04 DIAGNOSIS — Z1159 Encounter for screening for other viral diseases: Secondary | ICD-10-CM

## 2016-08-04 DIAGNOSIS — Z0000 Encounter for general adult medical examination without abnormal findings: Secondary | ICD-10-CM

## 2016-08-04 DIAGNOSIS — K219 Gastro-esophageal reflux disease without esophagitis: Secondary | ICD-10-CM | POA: Diagnosis not present

## 2016-08-04 DIAGNOSIS — M810 Age-related osteoporosis without current pathological fracture: Secondary | ICD-10-CM

## 2016-08-04 LAB — CBC
HCT: 37.9 % (ref 36.0–46.0)
Hemoglobin: 12.6 g/dL (ref 12.0–15.0)
MCHC: 33.1 g/dL (ref 30.0–36.0)
MCV: 88.6 fl (ref 78.0–100.0)
Platelets: 209 10*3/uL (ref 150.0–400.0)
RBC: 4.28 Mil/uL (ref 3.87–5.11)
RDW: 13 % (ref 11.5–15.5)
WBC: 5 10*3/uL (ref 4.0–10.5)

## 2016-08-04 LAB — HEPATITIS C ANTIBODY: HCV AB: NEGATIVE

## 2016-08-04 LAB — COMPREHENSIVE METABOLIC PANEL
ALBUMIN: 4.3 g/dL (ref 3.5–5.2)
ALK PHOS: 87 U/L (ref 39–117)
ALT: 20 U/L (ref 0–35)
AST: 20 U/L (ref 0–37)
BILIRUBIN TOTAL: 0.3 mg/dL (ref 0.2–1.2)
BUN: 20 mg/dL (ref 6–23)
CO2: 32 mEq/L (ref 19–32)
Calcium: 9.4 mg/dL (ref 8.4–10.5)
Chloride: 104 mEq/L (ref 96–112)
Creatinine, Ser: 0.85 mg/dL (ref 0.40–1.20)
GFR: 71.69 mL/min (ref >60.00)
GLUCOSE: 97 mg/dL (ref 70–99)
Potassium: 4.1 mEq/L (ref 3.5–5.1)
SODIUM: 141 meq/L (ref 135–145)
TOTAL PROTEIN: 6.2 g/dL (ref 6.0–8.3)

## 2016-08-04 LAB — LIPID PANEL
CHOLESTEROL: 130 mg/dL (ref 0–200)
HDL: 55.9 mg/dL (ref >39.00)
LDL Cholesterol: 60 mg/dL (ref 0–99)
NONHDL: 74.28
Total CHOL/HDL Ratio: 2
Triglycerides: 71 mg/dL (ref 0.0–149.0)
VLDL: 14.2 mg/dL (ref 0.0–40.0)

## 2016-08-04 LAB — TSH: TSH: 3.88 u[IU]/mL (ref 0.35–4.50)

## 2016-08-04 LAB — HEMOGLOBIN A1C: HEMOGLOBIN A1C: 5.7 % (ref 4.6–6.5)

## 2016-08-04 MED ORDER — ZOSTER VAC RECOMB ADJUVANTED 50 MCG/0.5ML IM SUSR: 0.5000 mL | Freq: Once | INTRAMUSCULAR | 0 refills | Status: AC

## 2016-08-04 NOTE — Assessment & Plan Note (Addendum)
On simvastatin 40 mg daily currently but her mental health meds increase her risk for change making yearly monitoring necessary. Goal LDL <130.

## 2016-08-04 NOTE — Progress Notes (Signed)
   Subjective:    Patient ID: Nancy Brooks, female    DOB: 04/01/1951, 66 y.o.   MRN: [id number]  HPI Here for medicare wellness and physical, no new complaints. Please see A/P for status and treatment of chronic medical problems.   Diet: heart healthy Physical activity: walks daily Depression/mood screen: sees psych for management Hearing: intact to whispered voice Visual acuity: grossly normal, performs annual eye exam  ADLs: capable Fall risk: none Home safety: good Cognitive evaluation: intact to orientation, naming, recall and repetition EOL planning: adv directives discussed  I have personally reviewed and have noted 1. The patient's medical and social history - reviewed today no changes 2. Their use of alcohol, tobacco or illicit drugs 3. Their current medications and supplements 4. The patient's functional ability including ADL's, fall risks, home safety risks and hearing or visual impairment. 5. Diet and physical activities 6. Evidence for depression or mood disorders 7. Care team reviewed and updated (available in snapshot)  Review of Systems  Constitutional: Negative.   HENT: Negative.   Eyes: Negative.   Respiratory: Negative for cough, chest tightness and shortness of breath.   Cardiovascular: Negative for chest pain, palpitations and leg swelling.  Gastrointestinal: Negative for abdominal distention, abdominal pain, constipation, diarrhea, nausea and vomiting.  Musculoskeletal: Negative.   Skin: Negative.   Neurological: Negative.   Psychiatric/Behavioral: Negative.        No change from prior      Objective:   Physical Exam  Constitutional: She is oriented to person, place, and time. She appears well-developed and well-nourished.  HENT:  Head: Normocephalic and atraumatic.  Eyes: EOM are normal.  Neck: Normal range of motion.  Cardiovascular: Normal rate and regular rhythm.   Rate slightly fast  Pulmonary/Chest: Effort normal and breath sounds  normal. No respiratory distress. She has no wheezes. She has no rales.  Abdominal: Soft. Bowel sounds are normal. She exhibits no distension. There is no tenderness. There is no rebound.  Musculoskeletal: She exhibits no edema.  Neurological: She is alert and oriented to person, place, and time. Coordination normal.  Skin: Skin is warm and dry.  Psychiatric:  Some flat affect, not responding to any external stimuli   Vitals:   08/04/16 1001  BP: 120/68  Pulse: (!) 112  Resp: 12  Temp: 97.9 F (36.6 C)  TempSrc: Oral  SpO2: 96%  Weight: 112 lb (50.8 kg)  Height: 5\' 1"  (1.549 m)      Assessment & Plan:

## 2016-08-04 NOTE — Assessment & Plan Note (Signed)
Checking hep c screening and labs, flu and tetanus shot up to date. Given rx for shingrix to get at the pharmacy. Counseled on sun safety and mole surveillance. Given 10 year screening recommendations. Colonoscopy up to date. She does not do pap smear or mammogram due to choice.

## 2016-08-04 NOTE — Assessment & Plan Note (Signed)
Started boniva in 2012 late and offered to stop since she has been on treatment for 5 years. She elects to continue and check bone density in several years and stop then if able.

## 2016-08-04 NOTE — Assessment & Plan Note (Signed)
Using tums as needed and rare symptoms currently.

## 2016-08-04 NOTE — Patient Instructions (Signed)
For the acid reflux it is okay to take tums.   Try to not lie down flat within 2-3 hours of eating to help not have acid reflux.   Health Maintenance, Female Adopting a healthy lifestyle and getting preventive care can go a long way to promote health and wellness. Talk with your health care provider about what schedule of regular examinations is right for you. This is a good chance for you to check in with your provider about disease prevention and staying healthy. In between checkups, there are plenty of things you can do on your own. Experts have done a lot of research about which lifestyle changes and preventive measures are most likely to keep you healthy. Ask your health care provider for more information. Weight and diet Eat a healthy diet  Be sure to include plenty of vegetables, fruits, low-fat dairy products, and lean protein.  Do not eat a lot of foods high in solid fats, added sugars, or salt.  Get regular exercise. This is one of the most important things you can do for your health.  Most adults should exercise for at least 150 minutes each week. The exercise should increase your heart rate and make you sweat (moderate-intensity exercise).  Most adults should also do strengthening exercises at least twice a week. This is in addition to the moderate-intensity exercise. Maintain a healthy weight  Body mass index (BMI) is a measurement that can be used to identify possible weight problems. It estimates body fat based on height and weight. Your health care provider can help determine your BMI and help you achieve or maintain a healthy weight.  For females 47 years of age and older:  A BMI below 18.5 is considered underweight.  A BMI of 18.5 to 24.9 is normal.  A BMI of 25 to 29.9 is considered overweight.  A BMI of 30 and above is considered obese. Watch levels of cholesterol and blood lipids  You should start having your blood tested for lipids and cholesterol at 64 years  of age, then have this test every 5 years.  You may need to have your cholesterol levels checked more often if:  Your lipid or cholesterol levels are high.  You are older than 64 years of age.  You are at high risk for heart disease. Cancer screening Lung Cancer  Lung cancer screening is recommended for adults 7-73 years old who are at high risk for lung cancer because of a history of smoking.  A yearly low-dose CT scan of the lungs is recommended for people who:  Currently smoke.  Have quit within the past 15 years.  Have at least a 30-pack-year history of smoking. A pack year is smoking an average of one pack of cigarettes a day for 1 year.  Yearly screening should continue until it has been 15 years since you quit.  Yearly screening should stop if you develop a health problem that would prevent you from having lung cancer treatment. Breast Cancer  Practice breast self-awareness. This means understanding how your breasts normally appear and feel.  It also means doing regular breast self-exams. Let your health care provider know about any changes, no matter how small.  If you are in your 20s or 30s, you should have a clinical breast exam (CBE) by a health care provider every 1-3 years as part of a regular health exam.  If you are 33 or older, have a CBE every year. Also consider having a breast X-ray (mammogram) every  year.  If you have a family history of breast cancer, talk to your health care provider about genetic screening.  If you are at high risk for breast cancer, talk to your health care provider about having an MRI and a mammogram every year.  Breast cancer gene (BRCA) assessment is recommended for women who have family members with BRCA-related cancers. BRCA-related cancers include:  Breast.  Ovarian.  Tubal.  Peritoneal cancers.  Results of the assessment will determine the need for genetic counseling and BRCA1 and BRCA2 testing. Cervical Cancer  Your  health care provider may recommend that you be screened regularly for cancer of the pelvic organs (ovaries, uterus, and vagina). This screening involves a pelvic examination, including checking for microscopic changes to the surface of your cervix (Pap test). You may be encouraged to have this screening done every 3 years, beginning at age 75.  For women ages 86-65, health care providers may recommend pelvic exams and Pap testing every 3 years, or they may recommend the Pap and pelvic exam, combined with testing for human papilloma virus (HPV), every 5 years. Some types of HPV increase your risk of cervical cancer. Testing for HPV may also be done on women of any age with unclear Pap test results.  Other health care providers may not recommend any screening for nonpregnant women who are considered low risk for pelvic cancer and who do not have symptoms. Ask your health care provider if a screening pelvic exam is right for you.  If you have had past treatment for cervical cancer or a condition that could lead to cancer, you need Pap tests and screening for cancer for at least 20 years after your treatment. If Pap tests have been discontinued, your risk factors (such as having a new sexual partner) need to be reassessed to determine if screening should resume. Some women have medical problems that increase the chance of getting cervical cancer. In these cases, your health care provider may recommend more frequent screening and Pap tests. Colorectal Cancer  This type of cancer can be detected and often prevented.  Routine colorectal cancer screening usually begins at 64 years of age and continues through 64 years of age.  Your health care provider may recommend screening at an earlier age if you have risk factors for colon cancer.  Your health care provider may also recommend using home test kits to check for hidden blood in the stool.  A small camera at the end of a tube can be used to examine your  colon directly (sigmoidoscopy or colonoscopy). This is done to check for the earliest forms of colorectal cancer.  Routine screening usually begins at age 32.  Direct examination of the colon should be repeated every 5-10 years through 64 years of age. However, you may need to be screened more often if early forms of precancerous polyps or small growths are found. Skin Cancer  Check your skin from head to toe regularly.  Tell your health care provider about any new moles or changes in moles, especially if there is a change in a mole's shape or color.  Also tell your health care provider if you have a mole that is larger than the size of a pencil eraser.  Always use sunscreen. Apply sunscreen liberally and repeatedly throughout the day.  Protect yourself by wearing long sleeves, pants, a wide-brimmed hat, and sunglasses whenever you are outside. Heart disease, diabetes, and high blood pressure  High blood pressure causes heart disease and increases  the risk of stroke. High blood pressure is more likely to develop in:  People who have blood pressure in the high end of the normal range (130-139/85-89 mm Hg).  People who are overweight or obese.  People who are African American.  If you are 90-34 years of age, have your blood pressure checked every 3-5 years. If you are 53 years of age or older, have your blood pressure checked every year. You should have your blood pressure measured twice-once when you are at a hospital or clinic, and once when you are not at a hospital or clinic. Record the average of the two measurements. To check your blood pressure when you are not at a hospital or clinic, you can use:  An automated blood pressure machine at a pharmacy.  A home blood pressure monitor.  If you are between 65 years and 14 years old, ask your health care provider if you should take aspirin to prevent strokes.  Have regular diabetes screenings. This involves taking a blood sample to  check your fasting blood sugar level.  If you are at a normal weight and have a low risk for diabetes, have this test once every three years after 64 years of age.  If you are overweight and have a high risk for diabetes, consider being tested at a younger age or more often. Preventing infection Hepatitis B  If you have a higher risk for hepatitis B, you should be screened for this virus. You are considered at high risk for hepatitis B if:  You were born in a country where hepatitis B is common. Ask your health care provider which countries are considered high risk.  Your parents were born in a high-risk country, and you have not been immunized against hepatitis B (hepatitis B vaccine).  You have HIV or AIDS.  You use needles to inject street drugs.  You live with someone who has hepatitis B.  You have had sex with someone who has hepatitis B.  You get hemodialysis treatment.  You take certain medicines for conditions, including cancer, organ transplantation, and autoimmune conditions. Hepatitis C  Blood testing is recommended for:  Everyone born from [birth year] through 1963.  Anyone with known risk factors for hepatitis C. Sexually transmitted infections (STIs)  You should be screened for sexually transmitted infections (STIs) including gonorrhea and chlamydia if:  You are sexually active and are younger than 64 years of age.  You are older than 64 years of age and your health care provider tells you that you are at risk for this type of infection.  Your sexual activity has changed since you were last screened and you are at an increased risk for chlamydia or gonorrhea. Ask your health care provider if you are at risk.  If you do not have HIV, but are at risk, it may be recommended that you take a prescription medicine daily to prevent HIV infection. This is called pre-exposure prophylaxis (PrEP). You are considered at risk if:  You are sexually active and do not regularly use  condoms or know the HIV status of your partner(s).  You take drugs by injection.  You are sexually active with a partner who has HIV. Talk with your health care provider about whether you are at high risk of being infected with HIV. If you choose to begin PrEP, you should first be tested for HIV. You should then be tested every 3 months for as long as you are taking PrEP. Pregnancy  If  you are premenopausal and you may become pregnant, ask your health care provider about preconception counseling.  If you may become pregnant, take 400 to 800 micrograms (mcg) of folic acid every day.  If you want to prevent pregnancy, talk to your health care provider about birth control (contraception). Osteoporosis and menopause  Osteoporosis is a disease in which the bones lose minerals and strength with aging. This can result in serious bone fractures. Your risk for osteoporosis can be identified using a bone density scan.  If you are 32 years of age or older, or if you are at risk for osteoporosis and fractures, ask your health care provider if you should be screened.  Ask your health care provider whether you should take a calcium or vitamin D supplement to lower your risk for osteoporosis.  Menopause may have certain physical symptoms and risks.  Hormone replacement therapy may reduce some of these symptoms and risks. Talk to your health care provider about whether hormone replacement therapy is right for you. Follow these instructions at home:  Schedule regular health, dental, and eye exams.  Stay current with your immunizations.  Do not use any tobacco products including cigarettes, chewing tobacco, or electronic cigarettes.  If you are pregnant, do not drink alcohol.  If you are breastfeeding, limit how much and how often you drink alcohol.  Limit alcohol intake to no more than 1 drink per day for nonpregnant women. One drink equals 12 ounces of beer, 5 ounces of wine, or 1 ounces of  hard liquor.  Do not use street drugs.  Do not share needles.  Ask your health care provider for help if you need support or information about quitting drugs.  Tell your health care provider if you often feel depressed.  Tell your health care provider if you have ever been abused or do not feel safe at home. This information is not intended to replace advice given to you by your health care provider. Make sure you discuss any questions you have with your health care provider. Document Released: 11/08/2010 Document Revised: 10/01/2015 Document Reviewed: 01/27/2015 Elsevier Interactive Patient Education  2017 [employer].

## 2016-08-04 NOTE — Progress Notes (Signed)
Pre visit review using our clinic review tool, if applicable. No additional management support is needed unless otherwise documented below in the visit note. 

## 2016-08-08 ENCOUNTER — Other Ambulatory Visit: Payer: Self-pay | Admitting: Internal Medicine

## 2016-08-09 ENCOUNTER — Ambulatory Visit
Admission: RE | Admit: 2016-08-09 | Discharge: 2016-08-09 | Disposition: A | Discharge status: Home / Self Care | Payer: Medicare Other | Source: Ambulatory Visit | Attending: Internal Medicine | Admitting: Internal Medicine

## 2016-08-09 DIAGNOSIS — Z1231 Encounter for screening mammogram for malignant neoplasm of breast: Secondary | ICD-10-CM

## 2016-12-04 ENCOUNTER — Other Ambulatory Visit: Payer: Self-pay | Admitting: Internal Medicine

## 2017-03-10 ENCOUNTER — Other Ambulatory Visit: Payer: Self-pay | Admitting: *Deleted

## 2017-03-10 MED ORDER — IBANDRONATE SODIUM 150 MG PO TABS: ORAL_TABLET | ORAL | 0 refills | Status: DC

## 2017-07-24 ENCOUNTER — Other Ambulatory Visit: Payer: Self-pay | Admitting: Internal Medicine

## 2017-08-15 ENCOUNTER — Encounter: Payer: Medicare Other | Admitting: Internal Medicine

## 2017-08-30 ENCOUNTER — Ambulatory Visit (INDEPENDENT_AMBULATORY_CARE_PROVIDER_SITE_OTHER): Payer: Medicare Other | Admitting: Internal Medicine

## 2017-08-30 ENCOUNTER — Encounter: Payer: Self-pay | Admitting: Internal Medicine

## 2017-08-30 ENCOUNTER — Other Ambulatory Visit (INDEPENDENT_AMBULATORY_CARE_PROVIDER_SITE_OTHER): Payer: Medicare Other

## 2017-08-30 VITALS — BP 110/80 | HR 101 | Temp 97.8°F | Ht 61.0 in | Wt 123.0 lb

## 2017-08-30 DIAGNOSIS — M810 Age-related osteoporosis without current pathological fracture: Secondary | ICD-10-CM

## 2017-08-30 DIAGNOSIS — E785 Hyperlipidemia, unspecified: Secondary | ICD-10-CM

## 2017-08-30 DIAGNOSIS — K219 Gastro-esophageal reflux disease without esophagitis: Secondary | ICD-10-CM | POA: Diagnosis not present

## 2017-08-30 DIAGNOSIS — Z0000 Encounter for general adult medical examination without abnormal findings: Secondary | ICD-10-CM | POA: Diagnosis not present

## 2017-08-30 LAB — COMPREHENSIVE METABOLIC PANEL
ALBUMIN: 4 g/dL (ref 3.5–5.2)
ALK PHOS: 91 U/L (ref 39–117)
ALT: 18 U/L (ref 0–35)
AST: 20 U/L (ref 0–37)
BUN: 16 mg/dL (ref 6–23)
CALCIUM: 9.3 mg/dL (ref 8.4–10.5)
CHLORIDE: 104 meq/L (ref 96–112)
CO2: 31 mEq/L (ref 19–32)
Creatinine, Ser: 0.79 mg/dL (ref 0.40–1.20)
GFR: 77.74 mL/min (ref >60.00)
Glucose, Bld: 89 mg/dL (ref 70–99)
POTASSIUM: 4.4 meq/L (ref 3.5–5.1)
SODIUM: 140 meq/L (ref 135–145)
Total Bilirubin: 0.3 mg/dL (ref 0.2–1.2)
Total Protein: 6.2 g/dL (ref 6.0–8.3)

## 2017-08-30 LAB — LIPID PANEL
CHOLESTEROL: 144 mg/dL (ref 0–200)
HDL: 71.9 mg/dL (ref >39.00)
LDL CALC: 54 mg/dL (ref 0–99)
NonHDL: 71.71
Total CHOL/HDL Ratio: 2
Triglycerides: 90 mg/dL (ref 0.0–149.0)
VLDL: 18 mg/dL (ref 0.0–40.0)

## 2017-08-30 LAB — HEMOGLOBIN A1C: Hgb A1c MFr Bld: 5.7 % (ref 4.6–6.5)

## 2017-08-30 LAB — CBC
HEMATOCRIT: 38.5 % (ref 36.0–46.0)
HEMOGLOBIN: 12.7 g/dL (ref 12.0–15.0)
MCHC: 32.9 g/dL (ref 30.0–36.0)
MCV: 89 fl (ref 78.0–100.0)
PLATELETS: 210 10*3/uL (ref 150.0–400.0)
RBC: 4.33 Mil/uL (ref 3.87–5.11)
RDW: 13.9 % (ref 11.5–15.5)
WBC: 5.6 10*3/uL (ref 4.0–10.5)

## 2017-08-30 LAB — TSH: TSH: 3.84 u[IU]/mL (ref 0.35–4.50)

## 2017-08-30 LAB — VITAMIN B12: Vitamin B-12: 171 pg/mL — ABNORMAL LOW (ref 211–911)

## 2017-08-30 LAB — VITAMIN D 25 HYDROXY (VIT D DEFICIENCY, FRACTURES): VITD: 36.65 ng/mL (ref 30.00–100.00)

## 2017-08-30 NOTE — Progress Notes (Signed)
   Subjective:    Patient ID: Nancy Brooks, female    DOB: Sep 26, 1950, 67 y.o.   MRN: [id number]  HPI Here for medicare wellness and physical, no new complaints. Please see A/P for status and treatment of chronic medical problems.   Diet: heart healthy Physical activity: sedentary Depression/mood screen: negative Hearing: intact to whispered voice Visual acuity: grossly normal, performs annual eye exam  ADLs: capable Fall risk: none Home safety: good Cognitive evaluation: intact to orientation, naming, recall and repetition EOL planning: adv directives discussed  I have personally reviewed and have noted 1. The patient's medical and social history - reviewed today no changes 2. Their use of alcohol, tobacco or illicit drugs 3. Their current medications and supplements 4. The patient's functional ability including ADL's, fall risks, home safety risks and hearing or visual impairment. 5. Diet and physical activities 6. Evidence for depression or mood disorders 7. Care team reviewed and updated (available in snapshot)  Review of Systems  Constitutional: Negative.   HENT: Negative.   Eyes: Negative.   Respiratory: Negative for cough, chest tightness and shortness of breath.   Cardiovascular: Negative for chest pain, palpitations and leg swelling.  Gastrointestinal: Negative for abdominal distention, abdominal pain, constipation, diarrhea, nausea and vomiting.  Musculoskeletal: Negative.   Skin: Negative.   Neurological: Negative.   Psychiatric/Behavioral: Negative.       Objective:   Physical Exam  Constitutional: She is oriented to person, place, and time. She appears well-developed and well-nourished.  HENT:  Head: Normocephalic and atraumatic.  Eyes: EOM are normal.  Neck: Normal range of motion.  Cardiovascular: Normal rate and regular rhythm.  Pulmonary/Chest: Effort normal and breath sounds normal. No respiratory distress. She has no wheezes. She has no rales.    Abdominal: Soft. Bowel sounds are normal. She exhibits no distension. There is no tenderness. There is no rebound.  Musculoskeletal: She exhibits no edema.  Neurological: She is alert and oriented to person, place, and time. Coordination normal.  Skin: Skin is warm and dry.  Psychiatric: She has a normal mood and affect.   Vitals:   08/30/17 1002  BP: 110/80  Pulse: (!) 101  Temp: 97.8 F (36.6 C)  TempSrc: Oral  SpO2: 93%  Weight: 123 lb (55.8 kg)  Height: 5\' 1"  (1.549 m)      Assessment & Plan:

## 2017-08-30 NOTE — Patient Instructions (Signed)
We are checking the blood work today and will call back with the results.   For the feet try aspercreme or capsaicin cream may help the pain and can be used when needed as many times per day as you need.   The acid reflux try taking zantac 150 mg daily for a couple of weeks and see if this helps. We will have you stop the boniva for a couple of months to see if this helps reduce your symptoms.    Food Choices for Gastroesophageal Reflux Disease, Adult When you have gastroesophageal reflux disease (GERD), the foods you eat and your eating habits are very important. Choosing the right foods can help ease your discomfort. What guidelines do I need to follow?  Choose fruits, vegetables, whole grains, and low-fat dairy products.  Choose low-fat meat, fish, and poultry.  Limit fats such as oils, salad dressings, butter, nuts, and avocado.  Keep a food diary. This helps you identify foods that cause symptoms.  Avoid foods that cause symptoms. These may be different for everyone.  Eat small meals often instead of 3 large meals a day.  Eat your meals slowly, in a place where you are relaxed.  Limit fried foods.  Cook foods using methods other than frying.  Avoid drinking alcohol.  Avoid drinking large amounts of liquids with your meals.  Avoid bending over or lying down until 2-3 hours after eating. What foods are not recommended? These are some foods and drinks that may make your symptoms worse: Vegetables Tomatoes. Tomato juice. Tomato and spaghetti sauce. Chili peppers. Onion and garlic. Horseradish. Fruits Oranges, grapefruit, and lemon (fruit and juice). Meats High-fat meats, fish, and poultry. This includes hot dogs, ribs, ham, sausage, salami, and bacon. Dairy Whole milk and chocolate milk. Sour cream. Cream. Butter. Ice cream. Cream cheese. Drinks Coffee and tea. Bubbly (carbonated) drinks or energy drinks. Condiments Hot sauce. Barbecue  sauce. Sweets/Desserts Chocolate and cocoa. Donuts. Peppermint and spearmint. Fats and Oils High-fat foods. This includes [place] fries and potato chips. Other Vinegar. Strong spices. This includes black pepper, white pepper, red pepper, cayenne, curry powder, cloves, ginger, and chili powder. The items listed above may not be a complete list of foods and drinks to avoid. Contact your dietitian for more information. This information is not intended to replace advice given to you by your health care provider. Make sure you discuss any questions you have with your health care provider. Document Released: 10/25/2011 Document Revised: 10/01/2015 Document Reviewed: 02/27/2013 Elsevier Interactive Patient Education  2017 [place] Maintenance, Female Adopting a healthy lifestyle and getting preventive care can go a long way to promote health and wellness. Talk with your health care provider about what schedule of regular examinations is right for you. This is a good chance for you to check in with your provider about disease prevention and staying healthy. In between checkups, there are plenty of things you can do on your own. Experts have done a lot of research about which lifestyle changes and preventive measures are most likely to keep you healthy. Ask your health care provider for more information. Weight and diet Eat a healthy diet  Be sure to include plenty of vegetables, fruits, low-fat dairy products, and lean protein.  Do not eat a lot of foods high in solid fats, added sugars, or salt.  Get regular exercise. This is one of the most important things you can do for your health. ? Most adults should exercise for at least  150 minutes each week. The exercise should increase your heart rate and make you sweat (moderate-intensity exercise). ? Most adults should also do strengthening exercises at least twice a week. This is in addition to the moderate-intensity exercise.  Maintain a  healthy weight  Body mass index (BMI) is a measurement that can be used to identify possible weight problems. It estimates body fat based on height and weight. Your health care provider can help determine your BMI and help you achieve or maintain a healthy weight.  For females 47 years of age and older: ? A BMI below 18.5 is considered underweight. ? A BMI of 18.5 to 24.9 is normal. ? A BMI of 25 to 29.9 is considered overweight. ? A BMI of 30 and above is considered obese.  Watch levels of cholesterol and blood lipids  You should start having your blood tested for lipids and cholesterol at 65 years of age, then have this test every 5 years.  You may need to have your cholesterol levels checked more often if: ? Your lipid or cholesterol levels are high. ? You are older than 65 years of age. ? You are at high risk for heart disease.  Cancer screening Lung Cancer  Lung cancer screening is recommended for adults 103-25 years old who are at high risk for lung cancer because of a history of smoking.  A yearly low-dose CT scan of the lungs is recommended for people who: ? Currently smoke. ? Have quit within the past 15 years. ? Have at least a 30-pack-year history of smoking. A pack year is smoking an average of one pack of cigarettes a day for 1 year.  Yearly screening should continue until it has been 15 years since you quit.  Yearly screening should stop if you develop a health problem that would prevent you from having lung cancer treatment.  Breast Cancer  Practice breast self-awareness. This means understanding how your breasts normally appear and feel.  It also means doing regular breast self-exams. Let your health care provider know about any changes, no matter how small.  If you are in your 20s or 30s, you should have a clinical breast exam (CBE) by a health care provider every 1-3 years as part of a regular health exam.  If you are 58 or older, have a CBE every year. Also  consider having a breast X-ray (mammogram) every year.  If you have a family history of breast cancer, talk to your health care provider about genetic screening.  If you are at high risk for breast cancer, talk to your health care provider about having an MRI and a mammogram every year.  Breast cancer gene (BRCA) assessment is recommended for women who have family members with BRCA-related cancers. BRCA-related cancers include: ? Breast. ? Ovarian. ? Tubal. ? Peritoneal cancers.  Results of the assessment will determine the need for genetic counseling and BRCA1 and BRCA2 testing.  Cervical Cancer Your health care provider may recommend that you be screened regularly for cancer of the pelvic organs (ovaries, uterus, and vagina). This screening involves a pelvic examination, including checking for microscopic changes to the surface of your cervix (Pap test). You may be encouraged to have this screening done every 3 years, beginning at age 22.  For women ages 57-65, health care providers may recommend pelvic exams and Pap testing every 3 years, or they may recommend the Pap and pelvic exam, combined with testing for human papilloma virus (HPV), every 5 years. Some  types of HPV increase your risk of cervical cancer. Testing for HPV may also be done on women of any age with unclear Pap test results.  Other health care providers may not recommend any screening for nonpregnant women who are considered low risk for pelvic cancer and who do not have symptoms. Ask your health care provider if a screening pelvic exam is right for you.  If you have had past treatment for cervical cancer or a condition that could lead to cancer, you need Pap tests and screening for cancer for at least 20 years after your treatment. If Pap tests have been discontinued, your risk factors (such as having a new sexual partner) need to be reassessed to determine if screening should resume. Some women have medical problems that  increase the chance of getting cervical cancer. In these cases, your health care provider may recommend more frequent screening and Pap tests.  Colorectal Cancer  This type of cancer can be detected and often prevented.  Routine colorectal cancer screening usually begins at 65 years of age and continues through 65 years of age.  Your health care provider may recommend screening at an earlier age if you have risk factors for colon cancer.  Your health care provider may also recommend using home test kits to check for hidden blood in the stool.  A small camera at the end of a tube can be used to examine your colon directly (sigmoidoscopy or colonoscopy). This is done to check for the earliest forms of colorectal cancer.  Routine screening usually begins at age 44.  Direct examination of the colon should be repeated every 5-10 years through 65 years of age. However, you may need to be screened more often if early forms of precancerous polyps or small growths are found.  Skin Cancer  Check your skin from head to toe regularly.  Tell your health care provider about any new moles or changes in moles, especially if there is a change in a mole's shape or color.  Also tell your health care provider if you have a mole that is larger than the size of a pencil eraser.  Always use sunscreen. Apply sunscreen liberally and repeatedly throughout the day.  Protect yourself by wearing long sleeves, pants, a wide-brimmed hat, and sunglasses whenever you are outside.  Heart disease, diabetes, and high blood pressure  High blood pressure causes heart disease and increases the risk of stroke. High blood pressure is more likely to develop in: ? People who have blood pressure in the high end of the normal range (130-139/85-89 mm Hg). ? People who are overweight or obese. ? People who are African American.  If you are 22-44 years of age, have your blood pressure checked every 3-5 years. If you are 13  years of age or older, have your blood pressure checked every year. You should have your blood pressure measured twice-once when you are at a hospital or clinic, and once when you are not at a hospital or clinic. Record the average of the two measurements. To check your blood pressure when you are not at a hospital or clinic, you can use: ? An automated blood pressure machine at a pharmacy. ? A home blood pressure monitor.  If you are between 80 years and 29 years old, ask your health care provider if you should take aspirin to prevent strokes.  Have regular diabetes screenings. This involves taking a blood sample to check your fasting blood sugar level. ? If you  are at a normal weight and have a low risk for diabetes, have this test once every three years after 65 years of age. ? If you are overweight and have a high risk for diabetes, consider being tested at a younger age or more often. Preventing infection Hepatitis B  If you have a higher risk for hepatitis B, you should be screened for this virus. You are considered at high risk for hepatitis B if: ? You were born in a country where hepatitis B is common. Ask your health care provider which countries are considered high risk. ? Your parents were born in a high-risk country, and you have not been immunized against hepatitis B (hepatitis B vaccine). ? You have HIV or AIDS. ? You use needles to inject street drugs. ? You live with someone who has hepatitis B. ? You have had sex with someone who has hepatitis B. ? You get hemodialysis treatment. ? You take certain medicines for conditions, including cancer, organ transplantation, and autoimmune conditions.  Hepatitis C  Blood testing is recommended for: ? Everyone born from [birth year] through 1963. ? Anyone with known risk factors for hepatitis C.  Sexually transmitted infections (STIs)  You should be screened for sexually transmitted infections (STIs) including gonorrhea and chlamydia  if: ? You are sexually active and are younger than 65 years of age. ? You are older than 65 years of age and your health care provider tells you that you are at risk for this type of infection. ? Your sexual activity has changed since you were last screened and you are at an increased risk for chlamydia or gonorrhea. Ask your health care provider if you are at risk.  If you do not have HIV, but are at risk, it may be recommended that you take a prescription medicine daily to prevent HIV infection. This is called pre-exposure prophylaxis (PrEP). You are considered at risk if: ? You are sexually active and do not regularly use condoms or know the HIV status of your partner(s). ? You take drugs by injection. ? You are sexually active with a partner who has HIV.  Talk with your health care provider about whether you are at high risk of being infected with HIV. If you choose to begin PrEP, you should first be tested for HIV. You should then be tested every 3 months for as long as you are taking PrEP. Pregnancy  If you are premenopausal and you may become pregnant, ask your health care provider about preconception counseling.  If you may become pregnant, take 400 to 800 micrograms (mcg) of folic acid every day.  If you want to prevent pregnancy, talk to your health care provider about birth control (contraception). Osteoporosis and menopause  Osteoporosis is a disease in which the bones lose minerals and strength with aging. This can result in serious bone fractures. Your risk for osteoporosis can be identified using a bone density scan.  If you are 38 years of age or older, or if you are at risk for osteoporosis and fractures, ask your health care provider if you should be screened.  Ask your health care provider whether you should take a calcium or vitamin D supplement to lower your risk for osteoporosis.  Menopause may have certain physical symptoms and risks.  Hormone replacement therapy  may reduce some of these symptoms and risks. Talk to your health care provider about whether hormone replacement therapy is right for you. Follow these instructions at home:  Schedule regular  health, dental, and eye exams.  Stay current with your immunizations.  Do not use any tobacco products including cigarettes, chewing tobacco, or electronic cigarettes.  If you are pregnant, do not drink alcohol.  If you are breastfeeding, limit how much and how often you drink alcohol.  Limit alcohol intake to no more than 1 drink per day for nonpregnant women. One drink equals 12 ounces of beer, 5 ounces of wine, or 1 ounces of hard liquor.  Do not use street drugs.  Do not share needles.  Ask your health care provider for help if you need support or information about quitting drugs.  Tell your health care provider if you often feel depressed.  Tell your health care provider if you have ever been abused or do not feel safe at home. This information is not intended to replace advice given to you by your health care provider. Make sure you discuss any questions you have with your health care provider. Document Released: 11/08/2010 Document Revised: 10/01/2015 Document Reviewed: 01/27/2015 Elsevier Interactive Patient Education  [employer].

## 2017-08-31 ENCOUNTER — Telehealth: Payer: Self-pay | Admitting: Internal Medicine

## 2017-08-31 ENCOUNTER — Other Ambulatory Visit: Payer: Self-pay | Admitting: Internal Medicine

## 2017-08-31 DIAGNOSIS — E538 Deficiency of other specified B group vitamins: Secondary | ICD-10-CM

## 2017-08-31 MED ORDER — VITAMIN B-12 1000 MCG PO TABS: 1000.0000 ug | ORAL_TABLET | Freq: Every day | ORAL | 3 refills | Status: DC

## 2017-08-31 NOTE — Telephone Encounter (Signed)
Taken care of through result notes

## 2017-08-31 NOTE — Telephone Encounter (Signed)
Copied from [place]. Topic: Quick Communication - See Telephone Encounter >> Aug 31, 2017 10:59 AM Conception Chancy, NT wrote: CRM for notification. See Telephone encounter for: 08/31/17.  Patient is calling and states she was seen on 08/30/17 and she said a RX for vitamin B12 was supposed to be called in. She states her pharmacy has not heard yet. Please advise.  [facility] [address] [place] [id number] Phone: [phone] Fax: [phone]

## 2017-09-01 NOTE — Assessment & Plan Note (Signed)
Flu and tetanus up to date. Pneumonia at 50, mammogram up to date. Counseled about shingles vaccine. Counseled about sun safety and mole surveillance. Given 10 year screening recommendations.

## 2017-09-01 NOTE — Assessment & Plan Note (Signed)
Checking lipid panel and adjust simvastatin at needed.

## 2017-09-01 NOTE — Assessment & Plan Note (Signed)
Hold boniva for a few months to see if this helps your gerd.

## 2017-09-01 NOTE — Assessment & Plan Note (Signed)
Will hold boniva to see if it helps with gerd. If it does help can consider switch.

## 2017-10-23 ENCOUNTER — Other Ambulatory Visit: Payer: Self-pay | Admitting: Internal Medicine

## 2017-10-24 ENCOUNTER — Encounter: Payer: Self-pay | Admitting: Internal Medicine

## 2017-11-30 ENCOUNTER — Encounter: Payer: Self-pay | Admitting: Internal Medicine

## 2017-11-30 ENCOUNTER — Ambulatory Visit: Payer: Medicare Other | Admitting: Internal Medicine

## 2017-11-30 ENCOUNTER — Other Ambulatory Visit (INDEPENDENT_AMBULATORY_CARE_PROVIDER_SITE_OTHER): Payer: Medicare Other

## 2017-11-30 DIAGNOSIS — M810 Age-related osteoporosis without current pathological fracture: Secondary | ICD-10-CM

## 2017-11-30 DIAGNOSIS — E538 Deficiency of other specified B group vitamins: Secondary | ICD-10-CM

## 2017-11-30 DIAGNOSIS — K219 Gastro-esophageal reflux disease without esophagitis: Secondary | ICD-10-CM | POA: Diagnosis not present

## 2017-11-30 LAB — VITAMIN B12

## 2017-11-30 MED ORDER — IBANDRONATE SODIUM 150 MG PO TABS: ORAL_TABLET | ORAL | 3 refills | Status: DC

## 2017-11-30 NOTE — Progress Notes (Signed)
   Subjective:    Patient ID: Nancy Brooks, female    DOB: 1951-04-04, 67 y.o.   MRN: [id number]  HPI The patient is a 67 YO female coming in for follow up of low B12 level. This was discovered at her physical about 3 months ago. Has been taking B12 oral replacement and wanted to see if she needs to continue. She has noticed decrease in numbness of her hands, still some tingling in her feet since starting the shots.  She also has questions about boniva (we had stopped this to see if it would help her gerd symptoms improve, she has not noticed a real difference being off, mild gerd symptoms only and previously not related to when she took her boniva) and gerd (using tums some and feels like burning at the back of her throat, denies taking other meds otc, denies nausea or vomiting, no dark stools or blood in stool, we had advised her to try zantac 150 mg daily or BID at last visit which she did not try).   Review of Systems  Constitutional: Negative.   HENT: Negative.   Eyes: Negative.   Respiratory: Negative for cough, chest tightness and shortness of breath.   Cardiovascular: Negative for chest pain, palpitations and leg swelling.  Gastrointestinal: Negative for abdominal distention, abdominal pain, constipation, diarrhea, nausea and vomiting.       GERD  Musculoskeletal: Negative.   Skin: Negative.   Neurological: Negative.        Tingling in feet  Psychiatric/Behavioral: Negative.       Objective:   Physical Exam  Constitutional: She is oriented to person, place, and time. She appears well-developed and well-nourished.  HENT:  Head: Normocephalic and atraumatic.  Eyes: EOM are normal.  Neck: Normal range of motion.  Cardiovascular: Normal rate and regular rhythm.  Pulmonary/Chest: Effort normal and breath sounds normal. No respiratory distress. She has no wheezes. She has no rales.  Abdominal: Soft. Bowel sounds are normal. She exhibits no distension. There is no tenderness. There  is no rebound.  Musculoskeletal: She exhibits no edema.  Neurological: She is alert and oriented to person, place, and time. Coordination normal.  Skin: Skin is warm and dry.  Psychiatric: She has a normal mood and affect.   Vitals:   11/30/17 1258  BP: 110/60  Pulse: (!) 110  Temp: 98.1 F (36.7 C)  TempSrc: Oral  SpO2: 97%  Weight: 121 lb (54.9 kg)  Height: 5\' 1"  (1.549 m)      Assessment & Plan:

## 2017-11-30 NOTE — Patient Instructions (Signed)
You can take zantac (ranitidine) 150 mg up to twice a day as needed for heart burn or reflux.   We will let you know about the B12 level.   Start taking boniva again we have sent in the refill.

## 2017-12-01 ENCOUNTER — Encounter: Payer: Self-pay | Admitting: Internal Medicine

## 2017-12-01 DIAGNOSIS — E538 Deficiency of other specified B group vitamins: Secondary | ICD-10-CM | POA: Insufficient documentation

## 2017-12-01 NOTE — Assessment & Plan Note (Signed)
Will resume boniva as it had no bearing on her GERD symptoms.

## 2017-12-01 NOTE — Assessment & Plan Note (Signed)
Taking oral replacement, checking B12 level today and adjust if needed.

## 2017-12-01 NOTE — Assessment & Plan Note (Signed)
Will ask her to try zantac daily prn for symptoms. Can still use tums if needed.

## 2018-01-24 ENCOUNTER — Encounter: Payer: Self-pay | Admitting: Internal Medicine

## 2018-01-24 DIAGNOSIS — M810 Age-related osteoporosis without current pathological fracture: Secondary | ICD-10-CM

## 2018-03-27 ENCOUNTER — Encounter: Payer: Self-pay | Admitting: Psychiatry

## 2018-03-27 ENCOUNTER — Ambulatory Visit: Payer: Medicare Other | Admitting: Psychiatry

## 2018-03-27 DIAGNOSIS — F200 Paranoid schizophrenia: Secondary | ICD-10-CM | POA: Diagnosis not present

## 2018-03-27 DIAGNOSIS — Z79899 Other long term (current) drug therapy: Secondary | ICD-10-CM

## 2018-03-27 NOTE — Progress Notes (Signed)
Nancy Brooks [id number] 01-15-1951 67 y.o.  Subjective:   Patient ID:  Nancy Brooks is a 67 y.o. (DOB 1950/10/02) female.  Chief Complaint:  Chief Complaint  Patient presents with  . Schizophrenia  . 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  On it for close to 20 years.  Still feels anxious and intermittently afraid and confused.  She brought in notes. Enjoys time with her sister.  Pt just turned 67 yo.  She still walks when weather permits.  Nervous driving and goes slow.  Has not had accidents and is not sedated from the meds.   Review of Systems:  Review of Systems  Respiratory: Positive for cough.   Gastrointestinal: Positive for constipation.  Neurological: Positive for tremors. Negative for weakness.  Psychiatric/Behavioral: Positive for behavioral problems, confusion, decreased concentration and hallucinations. Negative for agitation, dysphoric mood, self-injury, sleep disturbance and suicidal ideas. The patient is nervous/anxious. The patient is not hyperactive.   Feet sting at times.  Medications: I have reviewed the patient's current medications.  Current Outpatient Medications  Medication Sig Dispense Refill  . aspirin 81 MG tablet Take 81 mg by mouth daily.      . calcium elemental as carbonate (TUMS ULTRA 1000) 400 MG tablet Chew 1,000 mg by mouth daily.      . chlordiazePOXIDE (LIBRIUM) 10 MG capsule Take 10 mg by mouth 2 (two) times daily.   5  . Cholecalciferol (VITAMIN D3) 1000 UNITS CAPS Take by mouth daily.      . cloZAPine (CLOZARIL) 100 MG tablet Take 100 mg by mouth. 1 in am and 4 at night    . magnesium citrate SOLN Take 1 Bottle by mouth daily.    . simvastatin (ZOCOR) 40 MG tablet TAKE 1 TABLET(40 MG) BY MOUTH DAILY AT 6 PM 90 tablet 3  . vitamin B-12 (CYANOCOBALAMIN) 1000 MCG tablet Take 1 tablet (1,000 mcg total) by mouth daily. 90 tablet 3  .  ibandronate (BONIVA) 150 MG tablet TAKE 1 TABLET ONCE EVERY 30 DAYS IN THE MORNING WITH FULL GLASS OF WATER, ON EMPTY STOMACH, AND DO NOT EAT OR LIE DOWN FOR NEXT 30 MINUTES. 3 tablet 3   No current facility-administered medications for this visit.     Medication Side Effects: Other: constipation  Allergies: No Known Allergies  Past Medical History:  Diagnosis Date  . Anxiety   . BURSITIS, RIGHT KNEE   . Depression   . Dyskinesia of esophagus   . GAIT DISTURBANCE   . GERD   . HEMORRHOIDS, NOS   . HYPERLIPIDEMIA   . MENOPAUSAL SYNDROME   . Osteoporosis dx 03/2011   DEXA at gyn, started boniva in additon to Ca+D, changed to [place] 09/2012  . SCHIZOPHRENIA   . SYMPTOM, INCONTINENCE, URGE     Family History  Problem Relation Age of Onset  . Hypertension Mother   . Depression Mother   . Prostate cancer Father   . Colon cancer Brother 67  . Esophageal cancer Neg Hx   . Pancreatic cancer Neg Hx   . Rectal cancer Neg Hx   . Stomach cancer Neg Hx     Social History   Socioeconomic History  . Marital status: Single    Spouse name: Not on file  . Number of children: Not on file  . Years of education: Not on file  . Highest education level: Not on  file  Occupational History  . Not on file  Social Needs  . Financial resource strain: Not on file  . Food insecurity:    Worry: Not on file    Inability: Not on file  . Transportation needs:    Medical: Not on file    Non-medical: Not on file  Tobacco Use  . Smoking status: Never Smoker  . Smokeless tobacco: Never Used  Substance and Sexual Activity  . Alcohol use: No    Alcohol/week: 0.0 standard drinks  . Drug use: No  . Sexual activity: Not on file  Lifestyle  . Physical activity:    Days per week: Not on file    Minutes per session: Not on file  . Stress: Not on file  Relationships  . Social connections:    Talks on phone: Not on file    Gets together: Not on file    Attends religious service: Not on file     Active member of club or organization: Not on file    Attends meetings of clubs or organizations: Not on file    Relationship status: Not on file  . Intimate partner violence:    Fear of current or ex partner: Not on file    Emotionally abused: Not on file    Physically abused: Not on file    Forced sexual activity: Not on file  Other Topics Concern  . Not on file  Social History Narrative   Lives alone in single family home. Sister provides support and come to visits. Pt drives and indep ADLs.     Past Medical History, Surgical history, Social history, and Family history were reviewed and updated as appropriate.   Please see review of systems for further details on the patient's review from today.   Objective:   Physical Exam:  There were no vitals taken for this visit.  Physical Exam  Neurological: She displays no tremor. Gait abnormal.  She has stereotypic compulsive retracing of steps.  Psychiatric: Her speech is normal. Judgment normal. Her mood appears anxious. Her affect is labile. Her affect is not angry. She is actively hallucinating. She is not agitated, not aggressive and not hyperactive. Thought content is paranoid and delusional. She expresses no homicidal and no suicidal ideation. She exhibits abnormal recent memory.  Poor insight, judgment and cognitive executive function.  Difficulty with decisions and self care.  Needs help from sister.  She is inattentive.  Odd grimacing and poor social skills.  Easily overwhelmed with simple questions and tasks.  Focused on her weight daily. Chronic paranoia and intrusive thoughts.  Lab Review:     Component Value Date/Time   NA 140 08/30/2017 1052   K 4.4 08/30/2017 1052   CL 104 08/30/2017 1052   CO2 31 08/30/2017 1052   GLUCOSE 89 08/30/2017 1052   BUN 16 08/30/2017 1052   CREATININE 0.79 08/30/2017 1052   CALCIUM 9.3 08/30/2017 1052   PROT 6.2 08/30/2017 1052   ALBUMIN 4.0 08/30/2017 1052   AST 20 08/30/2017 1052    ALT 18 08/30/2017 1052   ALKPHOS 91 08/30/2017 1052   BILITOT 0.3 08/30/2017 1052       Component Value Date/Time   WBC 5.6 08/30/2017 1052   RBC 4.33 08/30/2017 1052   HGB 12.7 08/30/2017 1052   HCT 38.5 08/30/2017 1052   PLT 210.0 08/30/2017 1052   MCV 89.0 08/30/2017 1052   MCHC 32.9 08/30/2017 1052   RDW 13.9 08/30/2017 1052   LYMPHSABS 0.9  08/11/2014 1243   MONOABS 0.3 08/11/2014 1243   EOSABS 0.0 08/11/2014 1243   BASOSABS 0.0 08/11/2014 1243    No results found for: POCLITH, LITHIUM   No results found for: PHENYTOIN, PHENOBARB, VALPROATE, CBMZ   .res Assessment: Plan:    Paranoid schizophrenia ([place])  History of long-term treatment with high-risk medication   severe treatment resistant psychosis  Disc compliance with her medications.  Chronic severe schizophrenic symptoms of paranoia and negative symptoms.  Not able to function or self-care without the help of her sister.  Have adjusted the clozapine levels up and down without much difference in her overall symptoms.  She has chronic constipation as a SE but it's managed.  ANC has been stable for clozapine RX.  Disc risk of aplastic anemia. Continue monthly CBC.  We discussed the short-term risks associated with benzodiazepines including sedation and increased fall risk among others.  Discussed long-term side effect risk including dependence, potential withdrawal symptoms, and the potential eventual dose-related risk of dementia. She appears to benefit from the librium.  No med changes are likely to help.  This appt was 30 mins.  FU 3 mos, she wants 10 weeks.  Lynder Parents, MD, DFAPA   Please see After Visit Summary for patient specific instructions.  No future appointments.  No orders of the defined types were placed in this encounter.     -------------------------------

## 2018-04-30 ENCOUNTER — Other Ambulatory Visit: Payer: Self-pay

## 2018-04-30 MED ORDER — CHLORDIAZEPOXIDE HCL 10 MG PO CAPS: 10.0000 mg | ORAL_CAPSULE | Freq: Two times a day (BID) | ORAL | 2 refills | Status: DC

## 2018-06-05 ENCOUNTER — Ambulatory Visit: Payer: Medicare Other | Admitting: Psychiatry

## 2018-06-05 ENCOUNTER — Encounter: Payer: Self-pay | Admitting: Psychiatry

## 2018-06-05 DIAGNOSIS — Z79899 Other long term (current) drug therapy: Secondary | ICD-10-CM | POA: Diagnosis not present

## 2018-06-05 DIAGNOSIS — F411 Generalized anxiety disorder: Secondary | ICD-10-CM | POA: Diagnosis not present

## 2018-06-05 DIAGNOSIS — F200 Paranoid schizophrenia: Secondary | ICD-10-CM | POA: Diagnosis not present

## 2018-06-05 NOTE — Progress Notes (Signed)
Nancy Brooks [id number] 1951/01/21 68 y.o.  Subjective:   Patient ID:  Nancy Brooks is a 68 y.o. (DOB July 21, 1950) female.  Chief Complaint:  Chief Complaint  Patient presents with  . Follow-up    Medication management  . Anxiety    HPI last seen March 27, 2018 Nancy Brooks presents to the office today for follow-up of schizophrenia with severe negative symptoms.  Thanksgiving night heard car horns and people outside her house.  Then didn't do anything.  Did not call police nor look outside.  Did tell sister later.  No further such episodes.    Pt reports that mood is Anxious and describes anxiety as Moderate. Anxiety symptoms include: Excessive Worry, Obsessive Compulsive Symptoms:   compulsions in gait,. Chronic intrusive dysphoric thoughts usually of sexual nature.  Pt reports no sleep issues. Pt reports that appetite is good. Pt reports that energy is no change and good. Concentration is good. Suicidal thoughts:  denied by patient.  Does not have a lot of interests chronically.  Anxious around people.  Chronic AH makes her anxious.   Worries about "uncleanness".  Not unusually anxious at home.  Ate with family at holidays.  Does her own grocery shopping.  Sister helps with bills and appts.  Goes to [employer] every 2 weeks, sister gives her an allowance.  Little other social contact.  Some walking but less in the winter.  Multiple prior psych hospitalizations before starting clozapine.  Review of Systems:  Review of Systems  Musculoskeletal: Positive for arthralgias.       Foot pain and plans to see a doctor  Neurological: Positive for headaches. Negative for tremors and weakness.  Psychiatric/Behavioral: Positive for behavioral problems, confusion, decreased concentration and hallucinations. Negative for agitation, dysphoric mood, self-injury, sleep disturbance and suicidal ideas. The patient is nervous/anxious. The patient is not hyperactive.     Medications: I have  reviewed the patient's current medications.  Current Outpatient Medications  Medication Sig Dispense Refill  . aspirin 81 MG tablet Take 81 mg by mouth daily.      . calcium elemental as carbonate (TUMS ULTRA 1000) 400 MG tablet Chew 1,000 mg by mouth daily.      . chlordiazePOXIDE (LIBRIUM) 10 MG capsule Take 1 capsule (10 mg total) by mouth 2 (two) times daily. 60 capsule 2  . Cholecalciferol (VITAMIN D3) 1000 UNITS CAPS Take by mouth daily.      . cloZAPine (CLOZARIL) 100 MG tablet Take 100 mg by mouth. 1 in am and 4 at night    . famotidine (PEPCID) 10 MG tablet Take 10 mg by mouth 2 (two) times daily.    [name] ibandronate (BONIVA) 150 MG tablet TAKE 1 TABLET ONCE EVERY 30 DAYS IN THE MORNING WITH FULL GLASS OF WATER, ON EMPTY STOMACH, AND DO NOT EAT OR LIE DOWN FOR NEXT 30 MINUTES. 3 tablet 3  . magnesium citrate SOLN Take 1 Bottle by mouth daily.    . simvastatin (ZOCOR) 40 MG tablet TAKE 1 TABLET(40 MG) BY MOUTH DAILY AT 6 PM 90 tablet 3  . vitamin B-12 (CYANOCOBALAMIN) 1000 MCG tablet Take 1 tablet (1,000 mcg total) by mouth daily. 90 tablet 3   No current facility-administered medications for this visit.     Medication Side Effects: None  Allergies: No Known Allergies  Past Medical History:  Diagnosis Date  . Anxiety   . BURSITIS, RIGHT KNEE   . Depression   . Dyskinesia of esophagus   . GAIT  DISTURBANCE   . GERD   . HEMORRHOIDS, NOS   . HYPERLIPIDEMIA   . MENOPAUSAL SYNDROME   . Osteoporosis dx 03/2011   DEXA at gyn, started boniva in additon to Ca+D, changed to [place] 09/2012  . SCHIZOPHRENIA   . SYMPTOM, INCONTINENCE, URGE     Family History  Problem Relation Age of Onset  . Hypertension Mother   . Depression Mother   . Prostate cancer Father   . Colon cancer Brother 37  . Esophageal cancer Neg Hx   . Pancreatic cancer Neg Hx   . Rectal cancer Neg Hx   . Stomach cancer Neg Hx     Social History   Socioeconomic History  . Marital status: Single    Spouse  name: Not on file  . Number of children: Not on file  . Years of education: Not on file  . Highest education level: Not on file  Occupational History  . Not on file  Social Needs  . Financial resource strain: Not on file  . Food insecurity:    Worry: Not on file    Inability: Not on file  . Transportation needs:    Medical: Not on file    Non-medical: Not on file  Tobacco Use  . Smoking status: Never Smoker  . Smokeless tobacco: Never Used  Substance and Sexual Activity  . Alcohol use: No    Alcohol/week: 0.0 standard drinks  . Drug use: No  . Sexual activity: Not on file  Lifestyle  . Physical activity:    Days per week: Not on file    Minutes per session: Not on file  . Stress: Not on file  Relationships  . Social connections:    Talks on phone: Not on file    Gets together: Not on file    Attends religious service: Not on file    Active member of club or organization: Not on file    Attends meetings of clubs or organizations: Not on file    Relationship status: Not on file  . Intimate partner violence:    Fear of current or ex partner: Not on file    Emotionally abused: Not on file    Physically abused: Not on file    Forced sexual activity: Not on file  Other Topics Concern  . Not on file  Social History Narrative   Lives alone in single family home. Sister provides support and come to visits. Pt drives and indep ADLs.   sister Nancy Brooks 72 yo B Johnny 46 yo.  Past Medical History, Surgical history, Social history, and Family history were reviewed and updated as appropriate.   Please see review of systems for further details on the patient's review from today.   Objective:   Physical Exam:  There were no vitals taken for this visit.  Physical Exam Neurological:     Mental Status: She is alert and oriented to person, place, and time.     Motor: No tremor.     Gait: Gait normal.     Comments: Stereotypic compulsive style of walking with some retracing steps.   Psychiatric:        Attention and Perception: She is attentive. She perceives auditory hallucinations. She does not perceive visual hallucinations.        Mood and Affect: Mood is anxious. Affect is flat and inappropriate.        Speech: Speech is not rapid and pressured, delayed or slurred.  Behavior: Behavior is not agitated.        Thought Content: Thought content is paranoid. Thought content does not include homicidal or suicidal ideation.        Cognition and Memory: Cognition normal.     Comments: Odd facial grimacing with intrusive thoughts. Chronically anxious and very poor social skills but not uncooperative. Rigid. Chronic severe TR psychosis. Poor insight and judgment fair.     Lab Review:     Component Value Date/Time   NA 140 08/30/2017 1052   K 4.4 08/30/2017 1052   CL 104 08/30/2017 1052   CO2 31 08/30/2017 1052   GLUCOSE 89 08/30/2017 1052   BUN 16 08/30/2017 1052   CREATININE 0.79 08/30/2017 1052   CALCIUM 9.3 08/30/2017 1052   PROT 6.2 08/30/2017 1052   ALBUMIN 4.0 08/30/2017 1052   AST 20 08/30/2017 1052   ALT 18 08/30/2017 1052   ALKPHOS 91 08/30/2017 1052   BILITOT 0.3 08/30/2017 1052       Component Value Date/Time   WBC 5.6 08/30/2017 1052   RBC 4.33 08/30/2017 1052   HGB 12.7 08/30/2017 1052   HCT 38.5 08/30/2017 1052   PLT 210.0 08/30/2017 1052   MCV 89.0 08/30/2017 1052   MCHC 32.9 08/30/2017 1052   RDW 13.9 08/30/2017 1052   LYMPHSABS 0.9 08/11/2014 1243   MONOABS 0.3 08/11/2014 1243   EOSABS 0.0 08/11/2014 1243   BASOSABS 0.0 08/11/2014 1243   ANC count has been stable. No results found for: POCLITH, LITHIUM   No results found for: PHENYTOIN, PHENOBARB, VALPROATE, CBMZ   .res Assessment: Plan:    Paranoid schizophrenia ([place])  Generalized anxiety disorder  History of long-term treatment with high-risk medication   History of long-term treatment with high-risk medication   severe treatment resistant psychosis  Disc  compliance with her medications.  Chronic severe schizophrenic symptoms of paranoia and negative symptoms.  Not able to function or self-care without the help of her sister.  Have adjusted the clozapine levels up and down without much difference in her overall symptoms.  She has chronic constipation as a SE but it's managed.  ANC has been stable for clozapine RX.  Disc risk of aplastic anemia. Continue monthly CBC.  We discussed the short-term risks associated with benzodiazepines including sedation and increased fall risk among others.  Discussed long-term side effect risk including dependence, potential withdrawal symptoms, and the potential eventual dose-related risk of dementia. She appears to benefit from the librium.  No med changes are likely to help.  Disc how to deal with unwanted people at her house.  Needs chronic support and consistency. Therefore FU every 10 weeks  Lynder Parents, MD, DFAPA   Please see After Visit Summary for patient specific instructions.  Future Appointments  Date Time Provider [place]  07/03/2018 12:00 PM [facility] DX DEXA 1 [facility]    No orders of the defined types were placed in this encounter.     -------------------------------

## 2018-06-06 ENCOUNTER — Other Ambulatory Visit: Payer: Self-pay | Admitting: Psychiatry

## 2018-06-06 DIAGNOSIS — Z79899 Other long term (current) drug therapy: Secondary | ICD-10-CM

## 2018-06-06 NOTE — Progress Notes (Signed)
Standing order: CBC

## 2018-06-07 LAB — CBC WITH DIFFERENTIAL/PLATELET
Basophils Absolute: 0 10*3/uL (ref 0.0–0.2)
Basos: 0 %
EOS (ABSOLUTE): 0 10*3/uL (ref 0.0–0.4)
Eos: 0 %
Hematocrit: 37.6 % (ref 34.0–46.6)
Hemoglobin: 12.3 g/dL (ref 11.1–15.9)
Immature Grans (Abs): 0 10*3/uL (ref 0.0–0.1)
Immature Granulocytes: 0 %
LYMPHS: 28 %
Lymphocytes Absolute: 1.4 10*3/uL (ref 0.7–3.1)
MCH: 28.8 pg (ref 26.6–33.0)
MCHC: 32.7 g/dL (ref 31.5–35.7)
MCV: 88 fL (ref 79–97)
MONOCYTES: 7 %
Monocytes Absolute: 0.3 10*3/uL (ref 0.1–0.9)
Neutrophils Absolute: 3.2 10*3/uL (ref 1.4–7.0)
Neutrophils: 65 %
Platelets: 213 10*3/uL (ref 150–450)
RBC: 4.27 x10E6/uL (ref 3.77–5.28)
RDW: 14 % (ref 11.7–15.4)
WBC: 4.9 10*3/uL (ref 3.4–10.8)

## 2018-06-14 ENCOUNTER — Telehealth: Payer: Self-pay | Admitting: Psychiatry

## 2018-06-14 NOTE — Telephone Encounter (Signed)
Pt's sister [name] informed of results being sent to [facility].

## 2018-06-14 NOTE — Telephone Encounter (Signed)
Sister [name] has some concerns regarding went to [facility] a week ago to have blood work done the pharmacy ([facility] on [address] need lab results before releasing medication colozapine to patient [name] can be reached at [phone]

## 2018-06-14 NOTE — Telephone Encounter (Signed)
Labs faxed to [facility] and [facility]

## 2018-07-03 ENCOUNTER — Ambulatory Visit
Admission: RE | Admit: 2018-07-03 | Discharge: 2018-07-03 | Disposition: A | Discharge status: Home / Self Care | Payer: Medicare Other | Source: Ambulatory Visit | Attending: Internal Medicine | Admitting: Internal Medicine

## 2018-07-03 DIAGNOSIS — M810 Age-related osteoporosis without current pathological fracture: Secondary | ICD-10-CM

## 2018-07-04 ENCOUNTER — Other Ambulatory Visit: Payer: Self-pay | Admitting: Psychiatry

## 2018-07-05 LAB — CBC WITH DIFFERENTIAL/PLATELET
Basophils Absolute: 0 10*3/uL (ref 0.0–0.2)
Basos: 0 %
EOS (ABSOLUTE): 0 10*3/uL (ref 0.0–0.4)
Eos: 0 %
Hematocrit: 38.8 % (ref 34.0–46.6)
Hemoglobin: 12.7 g/dL (ref 11.1–15.9)
IMMATURE GRANS (ABS): 0 10*3/uL (ref 0.0–0.1)
Immature Granulocytes: 0 %
Lymphocytes Absolute: 1.3 10*3/uL (ref 0.7–3.1)
Lymphs: 24 %
MCH: 28.7 pg (ref 26.6–33.0)
MCHC: 32.7 g/dL (ref 31.5–35.7)
MCV: 88 fL (ref 79–97)
Monocytes Absolute: 0.4 10*3/uL (ref 0.1–0.9)
Monocytes: 7 %
Neutrophils Absolute: 3.7 10*3/uL (ref 1.4–7.0)
Neutrophils: 69 %
Platelets: 212 10*3/uL (ref 150–450)
RBC: 4.43 x10E6/uL (ref 3.77–5.28)
RDW: 13.7 % (ref 11.7–15.4)
WBC: 5.5 10*3/uL (ref 3.4–10.8)

## 2018-07-24 ENCOUNTER — Other Ambulatory Visit: Payer: Self-pay

## 2018-07-24 MED ORDER — CHLORDIAZEPOXIDE HCL 10 MG PO CAPS: 10.0000 mg | ORAL_CAPSULE | Freq: Two times a day (BID) | ORAL | 2 refills | Status: DC

## 2018-08-01 ENCOUNTER — Other Ambulatory Visit: Payer: Self-pay | Admitting: Psychiatry

## 2018-08-02 LAB — CBC WITH DIFFERENTIAL/PLATELET
Basophils Absolute: 0 10*3/uL (ref 0.0–0.2)
Basos: 0 %
EOS (ABSOLUTE): 0 10*3/uL (ref 0.0–0.4)
Eos: 0 %
HEMATOCRIT: 39.3 % (ref 34.0–46.6)
Hemoglobin: 13.1 g/dL (ref 11.1–15.9)
Immature Grans (Abs): 0 10*3/uL (ref 0.0–0.1)
Immature Granulocytes: 0 %
Lymphocytes Absolute: 1.5 10*3/uL (ref 0.7–3.1)
Lymphs: 21 %
MCH: 28.7 pg (ref 26.6–33.0)
MCHC: 33.3 g/dL (ref 31.5–35.7)
MCV: 86 fL (ref 79–97)
Monocytes Absolute: 0.5 10*3/uL (ref 0.1–0.9)
Monocytes: 7 %
Neutrophils Absolute: 4.9 10*3/uL (ref 1.4–7.0)
Neutrophils: 72 %
Platelets: 239 10*3/uL (ref 150–450)
RBC: 4.57 x10E6/uL (ref 3.77–5.28)
RDW: 13.4 % (ref 11.7–15.4)
WBC: 6.9 10*3/uL (ref 3.4–10.8)

## 2018-08-09 ENCOUNTER — Telehealth: Payer: Self-pay | Admitting: Psychiatry

## 2018-08-09 ENCOUNTER — Other Ambulatory Visit: Payer: Self-pay | Admitting: Psychiatry

## 2018-08-09 MED ORDER — CLOZAPINE 100 MG PO TABS: ORAL_TABLET | ORAL | 11 refills | Status: DC

## 2018-08-09 NOTE — Telephone Encounter (Signed)
Pt sister Mariella Saa called Pt needs refill on Clozapine 100mg . Pharm stated waiting on Provider to view labs and advise .  Please advise any changes.

## 2018-08-14 ENCOUNTER — Ambulatory Visit (INDEPENDENT_AMBULATORY_CARE_PROVIDER_SITE_OTHER): Payer: Medicare Other | Admitting: Psychiatry

## 2018-08-14 ENCOUNTER — Other Ambulatory Visit: Payer: Self-pay

## 2018-08-14 ENCOUNTER — Encounter: Payer: Self-pay | Admitting: Psychiatry

## 2018-08-14 DIAGNOSIS — Z79899 Other long term (current) drug therapy: Secondary | ICD-10-CM

## 2018-08-14 DIAGNOSIS — F411 Generalized anxiety disorder: Secondary | ICD-10-CM | POA: Diagnosis not present

## 2018-08-14 DIAGNOSIS — F200 Paranoid schizophrenia: Secondary | ICD-10-CM

## 2018-08-14 NOTE — Progress Notes (Signed)
Nancy Brooks [id number] [birth year] 68 y.o.  Subjective:   Patient ID:  Nancy Brooks is a 68 y.o. (DOB 30-Jan-1951) female.  Chief Complaint:  Chief Complaint  Patient presents with  . Schizophrenia  . Follow-up    med management  . psychosis    HPI  Nancy Brooks presentse today for follow-up of schizophrenia with severe negative symptoms.  Last seen June 06, 2018.  No med changes were made.  Got scared over a snake skin found in her attic.  [place] pest control.  Otherwise doing the same.  Staying away from public with Covid.  Can't go places she wants.  [name] helps.  Pt reports that mood is Anxious and describes anxiety as Moderate. Anxiety symptoms include: excessive and obsessive worry.  Paranoia. . Chronic intrusive dysphoric thoughts usually of sexual nature.  Pt reports some sleep issues. Pt reports that appetite is good. Pt reports that energy is no change and good. Concentration is good. Suicidal thoughts:  denied by patient.  Does not have a lot of interests chronically.  Anxious around people.  Chronic AH makes her anxious.   Worries about "uncleanness".  Not unusually anxious at home.  Has worries today she's afraid to discuss over the phone.   Does her own grocery shopping.  Sister helps with bills and appts..  Little other social contact.  Some walking daily 15 min dailyh.  Weight 101#.  Doesn't eat much.  Not going out like ususal.  Compliant and no med problems.  Multiple prior psych hospitalizations before starting clozapine.  Review of Systems:  Review of Systems  Constitutional: Positive for appetite change. Negative for unexpected weight change.  Musculoskeletal: Positive for arthralgias.       Foot pain and plans to see a doctor  Neurological: Negative for tremors, weakness and headaches.  Psychiatric/Behavioral: Positive for behavioral problems, confusion, decreased concentration and hallucinations. Negative for agitation, dysphoric mood,  self-injury, sleep disturbance and suicidal ideas. The patient is nervous/anxious. The patient is not hyperactive.     Medications: I have reviewed the patient's current medications.  Current Outpatient Medications  Medication Sig Dispense Refill  . aspirin 81 MG tablet Take 81 mg by mouth daily.      . calcium elemental as carbonate (TUMS ULTRA 1000) 400 MG tablet Chew 1,000 mg by mouth daily.      . chlordiazePOXIDE (LIBRIUM) 10 MG capsule Take 1 capsule (10 mg total) by mouth 2 (two) times daily. 60 capsule 2  . Cholecalciferol (VITAMIN D3) 1000 UNITS CAPS Take by mouth daily.      . cloZAPine (CLOZARIL) 100 MG tablet 1 in am and 4 at night 150 tablet 11  . famotidine (PEPCID) 10 MG tablet Take 10 mg by mouth 2 (two) times daily.    [name] ibandronate (BONIVA) 150 MG tablet TAKE 1 TABLET ONCE EVERY 30 DAYS IN THE MORNING WITH FULL GLASS OF WATER, ON EMPTY STOMACH, AND DO NOT EAT OR LIE DOWN FOR NEXT 30 MINUTES. 3 tablet 3  . magnesium citrate SOLN Take 1 Bottle by mouth daily.    . simvastatin (ZOCOR) 40 MG tablet TAKE 1 TABLET(40 MG) BY MOUTH DAILY AT 6 PM 90 tablet 3  . vitamin B-12 (CYANOCOBALAMIN) 1000 MCG tablet Take 1 tablet (1,000 mcg total) by mouth daily. 90 tablet 3   No current facility-administered medications for this visit.     Medication Side Effects: None  Allergies: No Known Allergies  Past Medical History:  Diagnosis Date  .  Anxiety   . BURSITIS, RIGHT KNEE   . Depression   . Dyskinesia of esophagus   . GAIT DISTURBANCE   . GERD   . HEMORRHOIDS, NOS   . HYPERLIPIDEMIA   . MENOPAUSAL SYNDROME   . Osteoporosis dx 03/2011   DEXA at gyn, started boniva in additon to Ca+D, changed to [place] 09/2012  . SCHIZOPHRENIA   . SYMPTOM, INCONTINENCE, URGE     Family History  Problem Relation Age of Onset  . Hypertension Mother   . Depression Mother   . Prostate cancer Father   . Colon cancer Brother 4  . Esophageal cancer Neg Hx   . Pancreatic cancer Neg Hx   .  Rectal cancer Neg Hx   . Stomach cancer Neg Hx     Social History   Socioeconomic History  . Marital status: Single    Spouse name: Not on file  . Number of children: Not on file  . Years of education: Not on file  . Highest education level: Not on file  Occupational History  . Not on file  Social Needs  . Financial resource strain: Not on file  . Food insecurity:    Worry: Not on file    Inability: Not on file  . Transportation needs:    Medical: Not on file    Non-medical: Not on file  Tobacco Use  . Smoking status: Never Smoker  . Smokeless tobacco: Never Used  Substance and Sexual Activity  . Alcohol use: No    Alcohol/week: 0.0 standard drinks  . Drug use: No  . Sexual activity: Not on file  Lifestyle  . Physical activity:    Days per week: Not on file    Minutes per session: Not on file  . Stress: Not on file  Relationships  . Social connections:    Talks on phone: Not on file    Gets together: Not on file    Attends religious service: Not on file    Active member of club or organization: Not on file    Attends meetings of clubs or organizations: Not on file    Relationship status: Not on file  . Intimate partner violence:    Fear of current or ex partner: Not on file    Emotionally abused: Not on file    Physically abused: Not on file    Forced sexual activity: Not on file  Other Topics Concern  . Not on file  Social History Narrative   Lives alone in single family home. Sister provides support and come to visits. Pt drives and indep ADLs.   sister [name] 70 yo B Johnny 2 yo.  Past Medical History, Surgical history, Social history, and Family history were reviewed and updated as appropriate.   Please see review of systems for further details on the patient's review from today.   Objective:   Physical Exam:  There were no vitals taken for this visit.  Physical Exam Neurological:     Mental Status: She is alert and oriented to person, place, and  time.     Motor: No tremor.     Gait: Gait normal.     Comments: Stereotypic compulsive style of walking with some retracing steps.  Psychiatric:        Attention and Perception: She is attentive. She perceives auditory hallucinations. She does not perceive visual hallucinations.        Mood and Affect: Mood is anxious. Affect is flat and inappropriate.  Speech: Speech is not rapid and pressured, delayed or slurred.        Behavior: Behavior is not agitated.        Thought Content: Thought content is paranoid. Thought content does not include homicidal or suicidal ideation.        Cognition and Memory: Cognition normal.     Comments: Odd facial grimacing with intrusive thoughts. Chronically anxious and very poor social skills but not uncooperative. Rigid. Chronic severe TR psychosis. Poor insight and judgment fair.     Lab Review:     Component Value Date/Time   NA 140 08/30/2017 1052   K 4.4 08/30/2017 1052   CL 104 08/30/2017 1052   CO2 31 08/30/2017 1052   GLUCOSE 89 08/30/2017 1052   BUN 16 08/30/2017 1052   CREATININE 0.79 08/30/2017 1052   CALCIUM 9.3 08/30/2017 1052   PROT 6.2 08/30/2017 1052   ALBUMIN 4.0 08/30/2017 1052   AST 20 08/30/2017 1052   ALT 18 08/30/2017 1052   ALKPHOS 91 08/30/2017 1052   BILITOT 0.3 08/30/2017 1052       Component Value Date/Time   WBC 6.9 08/01/2018 1047   WBC 5.6 08/30/2017 1052   RBC 4.57 08/01/2018 1047   RBC 4.33 08/30/2017 1052   HGB 13.1 08/01/2018 1047   HCT 39.3 08/01/2018 1047   PLT 239 08/01/2018 1047   MCV 86 08/01/2018 1047   MCH 28.7 08/01/2018 1047   MCHC 33.3 08/01/2018 1047   MCHC 32.9 08/30/2017 1052   RDW 13.4 08/01/2018 1047   LYMPHSABS 1.5 08/01/2018 1047   MONOABS 0.3 08/11/2014 1243   EOSABS 0.0 08/01/2018 1047   BASOSABS 0.0 08/01/2018 1047   ANC count has been stable. No results found for: POCLITH, LITHIUM   No results found for: PHENYTOIN, PHENOBARB, VALPROATE, CBMZ   .res Assessment:  Plan:    Paranoid schizophrenia ([place])  Generalized anxiety disorder  History of long-term treatment with high-risk medication   History of long-term treatment with high-risk medication   severe treatment resistant psychosis  Disc compliance with her medications.  Chronic severe schizophrenic symptoms of paranoia and negative symptoms.  She has not improved nor changed significantly since the last visit. Not able to function or self-care without the help of her sister.  Have adjusted the clozapine levels up and down without much difference in her overall symptoms.  She has chronic constipation as a SE but it's managed.  ANC has been stable for clozapine RX.  Disc risk of aplastic anemia. Continue monthly CBC.  We discussed the short-term risks associated with benzodiazepines including sedation and increased fall risk among others.  Discussed long-term side effect risk including dependence, potential withdrawal symptoms, and the potential eventual dose-related risk of dementia. She appears to benefit from the librium.  No med changes are likely to help.  Disc how to deal with unwanted people at her house.  Supportive therapy on her isolation.  Concern over her diet because she does not cook and normally gets food from the cafeteria.  Talked about this and ways to maximize her nutrition since she does not eat very much.  Specially emphasize protein.  Needs chronic support and consistency. Therefore FU every 10 weeks  I connected with patient by a video enabled telemedicine application or telephone, with their informed consent, and verified patient privacy and that I am speaking with the correct person using two identifiers.  I was located at office and patient at home.  She lacks the technical  skill and the equipment necessary to do visual or video conferencing.  Lynder Parents, MD, DFAPA   Please see After Visit Summary for patient specific instructions.  Future Appointments  Date  Time Provider [place]  09/11/2018 10:40 AM Hoyt Koch, MD [facility]    No orders of the defined types were placed in this encounter.     -------------------------------

## 2018-08-29 ENCOUNTER — Other Ambulatory Visit: Payer: Self-pay | Admitting: Psychiatry

## 2018-08-30 LAB — CBC WITH DIFFERENTIAL/PLATELET
Basophils Absolute: 0 10*3/uL (ref 0.0–0.2)
Basos: 0 %
EOS (ABSOLUTE): 0 10*3/uL (ref 0.0–0.4)
Eos: 0 %
Hematocrit: 36.6 % (ref 34.0–46.6)
Hemoglobin: 12.8 g/dL (ref 11.1–15.9)
Immature Grans (Abs): 0 10*3/uL (ref 0.0–0.1)
Immature Granulocytes: 1 %
Lymphocytes Absolute: 1.2 10*3/uL (ref 0.7–3.1)
Lymphs: 19 %
MCH: 30.5 pg (ref 26.6–33.0)
MCHC: 35 g/dL (ref 31.5–35.7)
MCV: 87 fL (ref 79–97)
Monocytes Absolute: 0.5 10*3/uL (ref 0.1–0.9)
Monocytes: 7 %
Neutrophils Absolute: 4.6 10*3/uL (ref 1.4–7.0)
Neutrophils: 73 %
Platelets: 215 10*3/uL (ref 150–450)
RBC: 4.19 x10E6/uL (ref 3.77–5.28)
RDW: 13.2 % (ref 11.7–15.4)
WBC: 6.3 10*3/uL (ref 3.4–10.8)

## 2018-09-03 ENCOUNTER — Encounter: Payer: Self-pay | Admitting: Internal Medicine

## 2018-09-04 ENCOUNTER — Encounter: Payer: Self-pay | Admitting: Internal Medicine

## 2018-09-04 ENCOUNTER — Ambulatory Visit: Payer: Medicare Other | Admitting: Internal Medicine

## 2018-09-11 ENCOUNTER — Encounter: Payer: Medicare Other | Admitting: Internal Medicine

## 2018-09-11 ENCOUNTER — Ambulatory Visit (INDEPENDENT_AMBULATORY_CARE_PROVIDER_SITE_OTHER): Payer: Medicare Other | Admitting: Internal Medicine

## 2018-09-11 DIAGNOSIS — E538 Deficiency of other specified B group vitamins: Secondary | ICD-10-CM | POA: Diagnosis not present

## 2018-09-11 DIAGNOSIS — K219 Gastro-esophageal reflux disease without esophagitis: Secondary | ICD-10-CM

## 2018-09-11 DIAGNOSIS — M810 Age-related osteoporosis without current pathological fracture: Secondary | ICD-10-CM | POA: Diagnosis not present

## 2018-09-11 DIAGNOSIS — E785 Hyperlipidemia, unspecified: Secondary | ICD-10-CM | POA: Diagnosis not present

## 2018-09-11 MED ORDER — SIMVASTATIN 40 MG PO TABS: 40.0000 mg | ORAL_TABLET | Freq: Every day | ORAL | 3 refills | Status: DC

## 2018-09-11 NOTE — Progress Notes (Signed)
Virtual Visit via Video Note  I connected with SALIAH CRISP on 09/11/18 at  4:00 PM EDT by a video enabled telemedicine application and verified that I am speaking with the correct person using two identifiers.  The patient and the provider were at separate locations throughout the entire encounter.   I discussed the limitations of evaluation and management by telemedicine and the availability of in person appointments. The patient expressed understanding and agreed to proceed.  History of Present Illness: The patient is a 68 y.o. female with visit for follow up of GERD (was taking zantac which was pulled from the market, was switched to pepcid, she is doing okay on that, wants to ask about its long term safety), and cholesterol (simvastatin still doing well, denies side effects, denies chest pains or stroke symptoms), and B12 deficiency (taking oral B12, has noticed some positive differences, denies side effects). Is practicing social distancing. History is provided by her sister who is present for the visit as well.  Observations/Objective: Appearance: normal, breathing appears normal, casual grooming, abdomen does not appear distended, throat normal, mental status is A and O  Assessment and Plan: See problem oriented charting  Follow Up Instructions: refill meds, f/u 6 months for physical exam and labs  I discussed the assessment and treatment plan with the patient. The patient was provided an opportunity to ask questions and all were answered. The patient agreed with the plan and demonstrated an understanding of the instructions.   The patient was advised to call back or seek an in-person evaluation if the symptoms worsen or if the condition fails to improve as anticipated.  Hoyt Koch, MD

## 2018-09-12 ENCOUNTER — Encounter: Payer: Self-pay | Admitting: Internal Medicine

## 2018-09-12 NOTE — Assessment & Plan Note (Signed)
Taking simvastatin 40 mg daily and doing well, needs labs at follow up in 6 months. Adjust as needed then due to pandemic.

## 2018-09-12 NOTE — Assessment & Plan Note (Signed)
Taking boniva at this time.

## 2018-09-12 NOTE — Assessment & Plan Note (Signed)
Doing well on pepcid and talked with them about the long term safety with this medication.

## 2018-09-12 NOTE — Assessment & Plan Note (Signed)
Will recheck labs in about 6 months with follow up, continue oral replacement at this time.

## 2018-09-26 ENCOUNTER — Other Ambulatory Visit: Payer: Self-pay | Admitting: Psychiatry

## 2018-09-27 LAB — CBC WITH DIFFERENTIAL/PLATELET
Basophils Absolute: 0 10*3/uL (ref 0.0–0.2)
Basos: 0 %
EOS (ABSOLUTE): 0 10*3/uL (ref 0.0–0.4)
Eos: 0 %
Hematocrit: 38.5 % (ref 34.0–46.6)
Hemoglobin: 12.9 g/dL (ref 11.1–15.9)
Immature Grans (Abs): 0 10*3/uL (ref 0.0–0.1)
Immature Granulocytes: 0 %
Lymphocytes Absolute: 1.1 10*3/uL (ref 0.7–3.1)
Lymphs: 22 %
MCH: 28.5 pg (ref 26.6–33.0)
MCHC: 33.5 g/dL (ref 31.5–35.7)
MCV: 85 fL (ref 79–97)
Monocytes Absolute: 0.4 10*3/uL (ref 0.1–0.9)
Monocytes: 8 %
Neutrophils Absolute: 3.6 10*3/uL (ref 1.4–7.0)
Neutrophils: 70 %
Platelets: 208 10*3/uL (ref 150–450)
RBC: 4.52 x10E6/uL (ref 3.77–5.28)
RDW: 13.7 % (ref 11.7–15.4)
WBC: 5.1 10*3/uL (ref 3.4–10.8)

## 2018-09-28 ENCOUNTER — Other Ambulatory Visit: Payer: Self-pay | Admitting: Obstetrics and Gynecology

## 2018-09-28 ENCOUNTER — Other Ambulatory Visit: Payer: Self-pay | Admitting: Internal Medicine

## 2018-09-28 DIAGNOSIS — Z1231 Encounter for screening mammogram for malignant neoplasm of breast: Secondary | ICD-10-CM

## 2018-10-09 ENCOUNTER — Ambulatory Visit: Payer: Medicare Other | Admitting: Psychiatry

## 2018-10-09 ENCOUNTER — Other Ambulatory Visit: Payer: Self-pay

## 2018-10-09 ENCOUNTER — Encounter: Payer: Self-pay | Admitting: Psychiatry

## 2018-10-09 DIAGNOSIS — F200 Paranoid schizophrenia: Secondary | ICD-10-CM | POA: Diagnosis not present

## 2018-10-09 DIAGNOSIS — F411 Generalized anxiety disorder: Secondary | ICD-10-CM | POA: Diagnosis not present

## 2018-10-09 DIAGNOSIS — Z79899 Other long term (current) drug therapy: Secondary | ICD-10-CM

## 2018-10-09 NOTE — Progress Notes (Signed)
Nancy Brooks [id number] 04-Aug-1950 68 y.o.  Subjective:   Patient ID:  Nancy Brooks is a 68 y.o. (DOB [birth year]) female.  Chief Complaint:  Chief Complaint  Patient presents with  . Follow-up    Medication mangement  . Anxiety    HPI  Nancy Brooks presentse today for follow-up of schizophrenia with severe negative symptoms.  Last seen August 14, 2018.  No med changes were made.  Doing extra house cleaning.  [name] helped some and cleaned most of fridge.  She helped clean kitchen.  Done more chores than usual.  Got rid of some old clothes.  Making picture frames.  Weight is stable.  Usually walks daily 15 min.  Some fears of Covid.  Had thoughts of denial of it initially.  Talked to [place] about it.  Has worn a mask when shopping.  [name] takes her to groceries.   Still has intrusive thoughts that she's a lesbian and if she does not believe she'll go to hell.  Has these thoughts a lot. Still hearing voices saying "you're crazy" or "wow, that's fun".  Pt reports that mood is Anxious and describes anxiety as Moderate. Anxiety symptoms include: excessive and obsessive worry.  Paranoia. . Chronic intrusive dysphoric thoughts usually of sexual nature or that she's going to hell.  Pt reports some sleep issues. Pt reports that appetite is good. Pt reports that energy is no change and good. Concentration is good. Suicidal thoughts:  denied by patient.  Does not have a lot of interests chronically.  Anxious around people.  Chronic AH makes her anxious.   Worries about "uncleanness".  Not unusually anxious at home.  Has worries today she's afraid to discuss over the phone.   Does her own grocery shopping.  Sister helps with bills and appts.. Talks to her daily.   Little other social contact.  Some walking daily 15 min dailyh.  Weight 101#.  Doesn't eat much.  Not going out like ususal.  Still goes to [place] on Sundays.  B will call and do chores at times.  Compliant and no med  problems.  Multiple prior psych hospitalizations before starting clozapine.  Review of Systems:  Review of Systems  Constitutional: Positive for appetite change. Negative for unexpected weight change.  Gastrointestinal: Positive for constipation.  Musculoskeletal: Positive for arthralgias.       Foot pain and plans to see a doctor  Neurological: Negative for tremors, weakness and headaches.  Psychiatric/Behavioral: Positive for behavioral problems, confusion, decreased concentration and hallucinations. Negative for agitation, dysphoric mood, self-injury, sleep disturbance and suicidal ideas. The patient is nervous/anxious. The patient is not hyperactive.     Medications: I have reviewed the patient's current medications.  Current Outpatient Medications  Medication Sig Dispense Refill  . aspirin 81 MG tablet Take 81 mg by mouth daily.      . calcium elemental as carbonate (TUMS ULTRA 1000) 400 MG tablet Chew 1,000 mg by mouth daily.      . chlordiazePOXIDE (LIBRIUM) 10 MG capsule Take 1 capsule (10 mg total) by mouth 2 (two) times daily. 60 capsule 2  . Cholecalciferol (VITAMIN D3) 1000 UNITS CAPS Take by mouth daily.      . cloZAPine (CLOZARIL) 100 MG tablet 1 in am and 4 at night 150 tablet 11  . famotidine (PEPCID) 10 MG tablet Take 10 mg by mouth 2 (two) times daily.    [name] ibandronate (BONIVA) 150 MG tablet TAKE 1 TABLET ONCE EVERY 30 DAYS  IN THE MORNING WITH FULL GLASS OF WATER, ON EMPTY STOMACH, AND DO NOT EAT OR LIE DOWN FOR NEXT 30 MINUTES. 3 tablet 3  . magnesium citrate SOLN Take 1 Bottle by mouth daily.    . simvastatin (ZOCOR) 40 MG tablet Take 1 tablet (40 mg total) by mouth daily at 6 PM. 90 tablet 3  . vitamin B-12 (CYANOCOBALAMIN) 1000 MCG tablet Take 1 tablet (1,000 mcg total) by mouth daily. 90 tablet 3   No current facility-administered medications for this visit.     Medication Side Effects: None  Allergies: No Known Allergies  Past Medical History:  Diagnosis  Date  . Anxiety   . BURSITIS, RIGHT KNEE   . Depression   . Dyskinesia of esophagus   . GAIT DISTURBANCE   . GERD   . HEMORRHOIDS, NOS   . HYPERLIPIDEMIA   . MENOPAUSAL SYNDROME   . Osteoporosis dx 03/2011   DEXA at gyn, started boniva in additon to Ca+D, changed to [place] 09/2012  . SCHIZOPHRENIA   . SYMPTOM, INCONTINENCE, URGE     Family History  Problem Relation Age of Onset  . Hypertension Mother   . Depression Mother   . Prostate cancer Father   . Colon cancer Brother 102  . Esophageal cancer Neg Hx   . Pancreatic cancer Neg Hx   . Rectal cancer Neg Hx   . Stomach cancer Neg Hx     Social History   Socioeconomic History  . Marital status: Single    Spouse name: Not on file  . Number of children: Not on file  . Years of education: Not on file  . Highest education level: Not on file  Occupational History  . Not on file  Social Needs  . Financial resource strain: Not on file  . Food insecurity:    Worry: Not on file    Inability: Not on file  . Transportation needs:    Medical: Not on file    Non-medical: Not on file  Tobacco Use  . Smoking status: Never Smoker  . Smokeless tobacco: Never Used  Substance and Sexual Activity  . Alcohol use: No    Alcohol/week: 0.0 standard drinks  . Drug use: No  . Sexual activity: Not on file  Lifestyle  . Physical activity:    Days per week: Not on file    Minutes per session: Not on file  . Stress: Not on file  Relationships  . Social connections:    Talks on phone: Not on file    Gets together: Not on file    Attends religious service: Not on file    Active member of club or organization: Not on file    Attends meetings of clubs or organizations: Not on file    Relationship status: Not on file  . Intimate partner violence:    Fear of current or ex partner: Not on file    Emotionally abused: Not on file    Physically abused: Not on file    Forced sexual activity: Not on file  Other Topics Concern  . Not on  file  Social History Narrative   Lives alone in single family home. Sister provides support and come to visits. Pt drives and indep ADLs.   sister [name] 36 yo B Johnny 58 yo.  Past Medical History, Surgical history, Social history, and Family history were reviewed and updated as appropriate.   Please see review of systems for further details on the patient's review  from today.   Objective:   Physical Exam:  There were no vitals taken for this visit.  Physical Exam Neurological:     Mental Status: She is alert and oriented to person, place, and time.     Motor: No tremor.     Gait: Gait abnormal.     Comments: Stereotypic compulsive style of walking with some retracing steps.  Psychiatric:        Attention and Perception: She is attentive. She perceives auditory hallucinations. She does not perceive visual hallucinations.        Mood and Affect: Mood is anxious. Affect is flat and inappropriate.        Speech: Speech is not rapid and pressured, delayed or slurred.        Behavior: Behavior is not agitated.        Thought Content: Thought content is paranoid. Thought content does not include homicidal or suicidal ideation.        Cognition and Memory: Cognition normal.     Comments: Odd facial grimacing with intrusive thoughts.  Chronic stereotypic and repetitive gestures especially when walking. Chronically anxious and very poor social skills but not uncooperative. Rigid. Chronic severe TR psychosis. Poor insight and judgment fair. Obsesses on weight but does not engage in behavioral disorders around eating.     Lab Review:     Component Value Date/Time   NA 140 08/30/2017 1052   K 4.4 08/30/2017 1052   CL 104 08/30/2017 1052   CO2 31 08/30/2017 1052   GLUCOSE 89 08/30/2017 1052   BUN 16 08/30/2017 1052   CREATININE 0.79 08/30/2017 1052   CALCIUM 9.3 08/30/2017 1052   PROT 6.2 08/30/2017 1052   ALBUMIN 4.0 08/30/2017 1052   AST 20 08/30/2017 1052   ALT 18 08/30/2017  1052   ALKPHOS 91 08/30/2017 1052   BILITOT 0.3 08/30/2017 1052       Component Value Date/Time   WBC 5.1 09/26/2018 1123   WBC 5.6 08/30/2017 1052   RBC 4.52 09/26/2018 1123   RBC 4.33 08/30/2017 1052   HGB 12.9 09/26/2018 1123   HCT 38.5 09/26/2018 1123   PLT 208 09/26/2018 1123   MCV 85 09/26/2018 1123   MCH 28.5 09/26/2018 1123   MCHC 33.5 09/26/2018 1123   MCHC 32.9 08/30/2017 1052   RDW 13.7 09/26/2018 1123   LYMPHSABS 1.1 09/26/2018 1123   MONOABS 0.3 08/11/2014 1243   EOSABS 0.0 09/26/2018 1123   BASOSABS 0.0 09/26/2018 1123   ANC count has been stable. No results found for: POCLITH, LITHIUM   No results found for: PHENYTOIN, PHENOBARB, VALPROATE, CBMZ   .res Assessment: Plan:    Paranoid schizophrenia ([place])  Generalized anxiety disorder  History of long-term treatment with high-risk medication   History of long-term treatment with high-risk medication   severe treatment resistant psychosis   Chronic severe schizophrenic symptoms of paranoia and negative symptoms.  She has not improved nor changed significantly since the last visit. Not able to function or self-care without the help of her sister.  Have adjusted the clozapine levels up and down without much difference in her overall symptoms.She is currently taking clozapine 400 mg daily as she did not improve at the higher dosages.  She has chronic constipation as a SE but it's managed.  ANC has been stable for clozapine RX.  Disc risk of aplastic anemia. Continue monthly CBC.Disc compliance with her medications.  She has been consistent.  We discussed the short-term risks associated with  benzodiazepines including sedation and increased fall risk among others.  Discussed long-term side effect risk including dependence, potential withdrawal symptoms, and the potential eventual dose-related risk of dementia. She appears to benefit from the librium.  No med changes are likely to help.  CBT around dealing  with voices and telling herself that it is the illness causing the voices and sometimes that helps her.  She was more open about the intrusive thoughts and voices today than she usually is.  She was less guarded than usual today which is an improvement.  Needs chronic support and consistency.   This appt was 30 mins.  herefore FU every 10 weeks  Lynder Parents, MD, DFAPA   Please see After Visit Summary for patient specific instructions.  Future Appointments  Date Time Provider [place]  11/20/2018 12:20 PM [facility]  03/12/2019  2:20 PM Hoyt Koch, MD [facility]    No orders of the defined types were placed in this encounter.     -------------------------------

## 2018-10-16 ENCOUNTER — Other Ambulatory Visit: Payer: Self-pay

## 2018-10-16 MED ORDER — CHLORDIAZEPOXIDE HCL 10 MG PO CAPS: 10.0000 mg | ORAL_CAPSULE | Freq: Two times a day (BID) | ORAL | 5 refills | Status: DC

## 2018-10-18 ENCOUNTER — Other Ambulatory Visit: Payer: Self-pay

## 2018-10-18 ENCOUNTER — Telehealth: Payer: Self-pay | Admitting: Psychiatry

## 2018-10-18 MED ORDER — CHLORDIAZEPOXIDE HCL 10 MG PO CAPS: 10.0000 mg | ORAL_CAPSULE | Freq: Two times a day (BID) | ORAL | 5 refills | Status: DC

## 2018-10-18 NOTE — Telephone Encounter (Signed)
Pended order to resend failed to transmit on 10/16/2018

## 2018-10-18 NOTE — Telephone Encounter (Signed)
Sister [name] stated pharmacy faxed po for Librium and they have not heard anything as of yet from our office and patient will run out of medication on Saturday

## 2018-10-24 ENCOUNTER — Other Ambulatory Visit: Payer: Self-pay | Admitting: Psychiatry

## 2018-10-25 LAB — CBC WITH DIFFERENTIAL/PLATELET
Basophils Absolute: 0 10*3/uL (ref 0.0–0.2)
Basos: 1 %
EOS (ABSOLUTE): 0.1 10*3/uL (ref 0.0–0.4)
Eos: 3 %
Hematocrit: 38 % (ref 34.0–46.6)
Hemoglobin: 12.9 g/dL (ref 11.1–15.9)
Immature Grans (Abs): 0 10*3/uL (ref 0.0–0.1)
Immature Granulocytes: 0 %
Lymphocytes Absolute: 1.3 10*3/uL (ref 0.7–3.1)
Lymphs: 24 %
MCH: 29 pg (ref 26.6–33.0)
MCHC: 33.9 g/dL (ref 31.5–35.7)
MCV: 85 fL (ref 79–97)
Monocytes Absolute: 0.4 10*3/uL (ref 0.1–0.9)
Monocytes: 8 %
Neutrophils Absolute: 3.4 10*3/uL (ref 1.4–7.0)
Neutrophils: 64 %
Platelets: 217 10*3/uL (ref 150–450)
RBC: 4.45 x10E6/uL (ref 3.77–5.28)
RDW: 13.2 % (ref 11.7–15.4)
WBC: 5.3 10*3/uL (ref 3.4–10.8)

## 2018-11-20 ENCOUNTER — Other Ambulatory Visit: Payer: Self-pay

## 2018-11-20 ENCOUNTER — Ambulatory Visit
Admission: RE | Admit: 2018-11-20 | Discharge: 2018-11-20 | Disposition: A | Discharge status: Home / Self Care | Payer: Medicare Other | Source: Ambulatory Visit | Attending: Obstetrics and Gynecology | Admitting: Obstetrics and Gynecology

## 2018-11-20 DIAGNOSIS — Z1231 Encounter for screening mammogram for malignant neoplasm of breast: Secondary | ICD-10-CM

## 2018-11-21 ENCOUNTER — Other Ambulatory Visit: Payer: Self-pay | Admitting: Psychiatry

## 2018-11-22 LAB — CBC WITH DIFFERENTIAL/PLATELET
Basophils Absolute: 0 10*3/uL (ref 0.0–0.2)
Basos: 0 %
EOS (ABSOLUTE): 0 10*3/uL (ref 0.0–0.4)
Eos: 0 %
Hematocrit: 37.8 % (ref 34.0–46.6)
Hemoglobin: 12.5 g/dL (ref 11.1–15.9)
Immature Grans (Abs): 0 10*3/uL (ref 0.0–0.1)
Immature Granulocytes: 0 %
Lymphocytes Absolute: 1.2 10*3/uL (ref 0.7–3.1)
Lymphs: 24 %
MCH: 28.7 pg (ref 26.6–33.0)
MCHC: 33.1 g/dL (ref 31.5–35.7)
MCV: 87 fL (ref 79–97)
Monocytes Absolute: 0.4 10*3/uL (ref 0.1–0.9)
Monocytes: 7 %
Neutrophils Absolute: 3.5 10*3/uL (ref 1.4–7.0)
Neutrophils: 69 %
Platelets: 201 10*3/uL (ref 150–450)
RBC: 4.36 x10E6/uL (ref 3.77–5.28)
RDW: 13.3 % (ref 11.7–15.4)
WBC: 5.1 10*3/uL (ref 3.4–10.8)

## 2018-11-29 ENCOUNTER — Encounter: Payer: Medicare Other | Admitting: Internal Medicine

## 2018-12-02 ENCOUNTER — Other Ambulatory Visit: Payer: Self-pay | Admitting: Internal Medicine

## 2018-12-11 ENCOUNTER — Other Ambulatory Visit: Payer: Self-pay

## 2018-12-11 ENCOUNTER — Ambulatory Visit (INDEPENDENT_AMBULATORY_CARE_PROVIDER_SITE_OTHER): Payer: Medicare Other | Admitting: Psychiatry

## 2018-12-11 ENCOUNTER — Encounter: Payer: Self-pay | Admitting: Psychiatry

## 2018-12-11 DIAGNOSIS — K5903 Drug induced constipation: Secondary | ICD-10-CM

## 2018-12-11 DIAGNOSIS — F200 Paranoid schizophrenia: Secondary | ICD-10-CM

## 2018-12-11 DIAGNOSIS — Z79899 Other long term (current) drug therapy: Secondary | ICD-10-CM

## 2018-12-11 DIAGNOSIS — F411 Generalized anxiety disorder: Secondary | ICD-10-CM

## 2018-12-11 NOTE — Progress Notes (Signed)
[name] [id number] [birth year] 68 y.o.  Subjective:   Patient ID:  Nancy Brooks is a 68 y.o. (DOB 06-24-1950) female.  Chief Complaint:  Chief Complaint  Patient presents with  . Follow-up    Medication Management  . Schizophrenia    Medication Management    HPI  [name] presentse today for follow-up of schizophrenia with severe negative symptoms.  Seen October 09, 2018.  No med changes were made.  Got sick yesterday acid reflux and felt hot.  Had NV.  Did a 15 min walk yesterday but also felt bad driving in the car yesterday.  Has several antiques in the house.  Says  Meds will disappear and then appear out of nowhere in her  House at times that upsets her.  Also episode many years of hearing a knock on outside of the house but she didn't go  Out  And doesn't know what it was.  She'll call 911 if threatened.  Nothing like these scared her lately.    Asked about to stop Mg citrate bc the pills are so big.  Has had some px with constipation 2 times since here.  Doesn't want to continue the Mg tablets.  Taking Miralax qod bc diarrhea if daily.  Thinks she's getting more sleep now.  Doesn't know how many hours.  Goes to bed 10 pm.  Gets OOB at different times.  Used to have routine to get OOB at 5 AM to weigh person.  Making picture frames.  Weight is stable.  Usually walks daily 15 min.  Some fears of Covid.  Had thoughts of denial of it initially.  Talked to [place] about it.  Has worn a mask when shopping.  [name] takes her to groceries.   Still has intrusive thoughts that she's a lesbian and if she does not believe she'll go to hell.  Has these thoughts a lot.  Still hearing voices saying "you're crazy" or "wow, that's fun".  Pt reports that mood is Anxious and describes anxiety as Moderate. Anxiety symptoms include: excessive and obsessive worry.  Paranoia. . Chronic intrusive dysphoric thoughts usually of sexual nature or that she's going to hell.  Pt reports some sleep  issues. Pt reports that appetite is good. Pt reports that energy is no change and good. Concentration is good. Suicidal thoughts:  denied by patient.  Does not have a lot of interests chronically.  Anxious around people.  Chronic AH makes her anxious.   Worries about "uncleanness".  Not unusually anxious at home.  Has worries today she's afraid to discuss over the phone.   Does her own grocery shopping.  Sister helps with bills and appts.. Talks to her daily.   Little other social contact.  Some walking daily 15 min dailyh.  Weight 101#.  Doesn't eat much.  Not going out like ususal.  Still goes to [place] on Sundays.  B will call and do chores at times.  Compliant and no med problems. [name] helps her with store shopping and gas station.  Multiple prior psych hospitalizations before starting clozapine.  Review of Systems:  Review of Systems  Constitutional: Positive for appetite change. Negative for unexpected weight change.  Gastrointestinal: Positive for constipation.  Musculoskeletal: Positive for arthralgias.       Foot pain and plans to see a doctor  Neurological: Negative for tremors, weakness and headaches.  Psychiatric/Behavioral: Positive for behavioral problems, confusion, decreased concentration and hallucinations. Negative for agitation, dysphoric mood, self-injury, sleep  disturbance and suicidal ideas. The patient is nervous/anxious. The patient is not hyperactive.     Medications: I have reviewed the patient's current medications.  Current Outpatient Medications  Medication Sig Dispense Refill  . aspirin 81 MG tablet Take 81 mg by mouth daily.      . calcium elemental as carbonate (TUMS ULTRA 1000) 400 MG tablet Chew 1,000 mg by mouth daily.      . chlordiazePOXIDE (LIBRIUM) 10 MG capsule Take 1 capsule (10 mg total) by mouth 2 (two) times daily. 60 capsule 5  . Cholecalciferol (VITAMIN D3) 1000 UNITS CAPS Take by mouth daily.      . cloZAPine (CLOZARIL) 100 MG tablet 1  in am and 4 at night (Patient taking differently: Take 400 mg by mouth at bedtime. 1 in am and 4 at night) 150 tablet 11  . famotidine (PEPCID) 10 MG tablet Take 10 mg by mouth daily.     [name] ibandronate (BONIVA) 150 MG tablet SEE NOTES 3 tablet 3  . magnesium citrate SOLN Take 1 Bottle by mouth daily.    . polyethylene glycol (MIRALAX / GLYCOLAX) 17 g packet Take 17 g by mouth daily as needed.    . simvastatin (ZOCOR) 40 MG tablet Take 1 tablet (40 mg total) by mouth daily at 6 PM. 90 tablet 3  . vitamin B-12 (CYANOCOBALAMIN) 1000 MCG tablet Take 1 tablet (1,000 mcg total) by mouth daily. 90 tablet 3   No current facility-administered medications for this visit.     Medication Side Effects: None  Allergies: No Known Allergies  Past Medical History:  Diagnosis Date  . Anxiety   . BURSITIS, RIGHT KNEE   . Depression   . Dyskinesia of esophagus   . GAIT DISTURBANCE   . GERD   . HEMORRHOIDS, NOS   . HYPERLIPIDEMIA   . MENOPAUSAL SYNDROME   . Osteoporosis dx 03/2011   DEXA at gyn, started boniva in additon to Ca+D, changed to [place] 09/2012  . SCHIZOPHRENIA   . SYMPTOM, INCONTINENCE, URGE     Family History  Problem Relation Age of Onset  . Hypertension Mother   . Depression Mother   . Prostate cancer Father   . Colon cancer Brother 54  . Esophageal cancer Neg Hx   . Pancreatic cancer Neg Hx   . Rectal cancer Neg Hx   . Stomach cancer Neg Hx     Social History   Socioeconomic History  . Marital status: Single    Spouse name: Not on file  . Number of children: Not on file  . Years of education: Not on file  . Highest education level: Not on file  Occupational History  . Not on file  Social Needs  . Financial resource strain: Not on file  . Food insecurity    Worry: Not on file    Inability: Not on file  . Transportation needs    Medical: Not on file    Non-medical: Not on file  Tobacco Use  . Smoking status: Never Smoker  . Smokeless tobacco: Never Used   Substance and Sexual Activity  . Alcohol use: No    Alcohol/week: 0.0 standard drinks  . Drug use: No  . Sexual activity: Not on file  Lifestyle  . Physical activity    Days per week: Not on file    Minutes per session: Not on file  . Stress: Not on file  Relationships  . Social Herbalist on phone: Not  on file    Gets together: Not on file    Attends religious service: Not on file    Active member of club or organization: Not on file    Attends meetings of clubs or organizations: Not on file    Relationship status: Not on file  . Intimate partner violence    Fear of current or ex partner: Not on file    Emotionally abused: Not on file    Physically abused: Not on file    Forced sexual activity: Not on file  Other Topics Concern  . Not on file  Social History Narrative   Lives alone in single family home. Sister provides support and come to visits. Pt drives and indep ADLs.   sister [name] 90 yo B Johnny 34 yo.  Past Medical History, Surgical history, Social history, and Family history were reviewed and updated as appropriate.   Please see review of systems for further details on the patient's review from today.   Objective:   Physical Exam:  There were no vitals taken for this visit.  Physical Exam Neurological:     Mental Status: She is alert and oriented to person, place, and time.     Motor: No tremor.     Gait: Gait abnormal.     Comments: Stereotypic compulsive style of walking with some retracing steps.  Psychiatric:        Attention and Perception: She is attentive. She perceives auditory hallucinations. She does not perceive visual hallucinations.        Mood and Affect: Mood is anxious. Affect is flat and inappropriate.        Speech: Speech is not rapid and pressured, delayed or slurred.        Behavior: Behavior is not agitated.        Thought Content: Thought content is paranoid. Thought content does not include homicidal or suicidal ideation.         Cognition and Memory: Cognition normal.     Comments: Odd facial grimacing with intrusive thoughts.  Chronic stereotypic and repetitive gestures especially when walking. Chronically anxious and very poor social skills but not uncooperative. Rigid. Chronic severe TR psychosis. Poor insight and judgment fair. Obsesses on weight but does not engage in behavioral disorders around eating.  Affect chronically distressed. Stereotypic movments.    Brought picture frames she dressed up with crafts to show.  Lab Review:     Component Value Date/Time   NA 140 08/30/2017 1052   K 4.4 08/30/2017 1052   CL 104 08/30/2017 1052   CO2 31 08/30/2017 1052   GLUCOSE 89 08/30/2017 1052   BUN 16 08/30/2017 1052   CREATININE 0.79 08/30/2017 1052   CALCIUM 9.3 08/30/2017 1052   PROT 6.2 08/30/2017 1052   ALBUMIN 4.0 08/30/2017 1052   AST 20 08/30/2017 1052   ALT 18 08/30/2017 1052   ALKPHOS 91 08/30/2017 1052   BILITOT 0.3 08/30/2017 1052       Component Value Date/Time   WBC 5.1 11/21/2018 1031   WBC 5.6 08/30/2017 1052   RBC 4.36 11/21/2018 1031   RBC 4.33 08/30/2017 1052   HGB 12.5 11/21/2018 1031   HCT 37.8 11/21/2018 1031   PLT 201 11/21/2018 1031   MCV 87 11/21/2018 1031   MCH 28.7 11/21/2018 1031   MCHC 33.1 11/21/2018 1031   MCHC 32.9 08/30/2017 1052   RDW 13.3 11/21/2018 1031   LYMPHSABS 1.2 11/21/2018 1031   MONOABS 0.3 08/11/2014 1243  EOSABS 0.0 11/21/2018 1031   BASOSABS 0.0 11/21/2018 1031   ANC count has been stable. No results found for: POCLITH, LITHIUM   No results found for: PHENYTOIN, PHENOBARB, VALPROATE, CBMZ   .res Assessment: Plan:    Samirah was seen today for follow-up and schizophrenia.  Diagnoses and all orders for this visit:  Paranoid schizophrenia ([place])  Generalized anxiety disorder  History of long-term treatment with high-risk medication  Drug-induced constipation  History of long-term treatment with high-risk medication    severe treatment resistant psychosis   Chronic severe schizophrenic symptoms of paranoia and negative symptoms.  She has not improved nor changed significantly since the last visit nor significantly since when she was first seen in a number of years ago.. Not able to function or self-care without the help of her sister.  Have adjusted the clozapine levels up and down without much difference in her overall symptoms.She is currently taking clozapine 400 mg daily as she did not improve at the higher dosages.  She has chronic constipation as a SE but it's managed.  ANC has been stable for clozapine RX.  Disc risk of aplastic anemia. Continue monthly CBC.Disc compliance with her medications.  She has been consistent.  We discussed the short-term risks associated with benzodiazepines including sedation and increased fall risk among others.  Discussed long-term side effect risk including dependence, potential withdrawal symptoms, and the potential eventual dose-related risk of dementia. She appears to benefit from the librium.  No med changes are likely to help.  CBT around dealing with voices and telling herself that it is the illness causing the voices and sometimes that helps her.  She was more open about the intrusive thoughts and voices today than she usually is.  She was less guarded than usual today which is an improvement.  Needs chronic support and consistency.  She feels chronically fragile.  She has no social support except for her family.  Care about walking in the heat DT med risks.  OK to stop magnesium tablets but will need to take Miralax daily to prevent constipation.   If you get diarrhea from daily Miralax then use 3/4 of dispenser cap in liquid instead of the full dispenser cup daily.  This appt was 30 mins.  herefore FU every 10 weeks  Lynder Parents, MD, DFAPA   Please see After Visit Summary for patient specific instructions.  Future Appointments  Date Time  Provider [place]  03/12/2019  2:20 PM Hoyt Koch, MD [id number]    No orders of the defined types were placed in this encounter.     -------------------------------

## 2018-12-11 NOTE — Patient Instructions (Addendum)
OK to stop magnesium tablets but will need to take Miralax daily to prevent constipation.   If you get diarrhea from daily Miralax then use 3/4 of dispenser cap in liquid instead of the full dispenser cup daily.

## 2019-01-17 ENCOUNTER — Encounter: Payer: Self-pay | Admitting: Psychiatry

## 2019-02-12 ENCOUNTER — Ambulatory Visit (INDEPENDENT_AMBULATORY_CARE_PROVIDER_SITE_OTHER): Payer: Medicare Other | Admitting: Psychiatry

## 2019-02-12 ENCOUNTER — Other Ambulatory Visit: Payer: Self-pay

## 2019-02-12 ENCOUNTER — Encounter: Payer: Self-pay | Admitting: Psychiatry

## 2019-02-12 DIAGNOSIS — F200 Paranoid schizophrenia: Secondary | ICD-10-CM | POA: Diagnosis not present

## 2019-02-12 DIAGNOSIS — K5903 Drug induced constipation: Secondary | ICD-10-CM | POA: Diagnosis not present

## 2019-02-12 DIAGNOSIS — Z79899 Other long term (current) drug therapy: Secondary | ICD-10-CM

## 2019-02-12 DIAGNOSIS — F411 Generalized anxiety disorder: Secondary | ICD-10-CM | POA: Diagnosis not present

## 2019-02-12 NOTE — Progress Notes (Signed)
KADEE JASA JA:[id number] 1951-01-09 68 y.o.  Subjective:   Patient ID:  EVANS COTA is a 68 y.o. (DOB May 09, 1951) female.  Chief Complaint:  Chief Complaint  Patient presents with  . Hallucinations  . Diarrhea    HPI  DNYLA GONDEK presentse today for follow-up of schizophrenia with severe negative symptoms.  Seen December 11, 2018.  No med changes were made.  Brought in examples of some of the crafts she's made,  Picture frames and flower baskets etc.  Enjoys this and gets saitsfaction from it.  Sister paints her nails for her.  Still has bad thoughts intrusively over many years.  Wants to seek reassurance they are not true with the MD.  Still has voices without changes.  Intrusive words that are disturbing about family.  Some are intrusive thoughts and some of disturbing words are voices.  She tries to ignore the thoughts and voices.    Still gets hot easily.    Has several antiques in the house.  Says  Meds will disappear and then appear out of nowhere in her  House at times that upsets her.  Also episode many years of hearing a knock on outside of the house but she didn't go  Out  And doesn't know what it was.  She'll call 911 if threatened.  Nothing like these scared her lately.    Asked about to stop Mg citrate bc the pills are so big.  Has had some px with constipation 2 times since here.  Doesn't want to continue the Mg tablets.  Taking Miralax qod bc diarrhea if daily.  Thinks she's getting more sleep now.  Doesn't know how many hours.  Goes to bed 10 pm.  Gets OOB at different times.  Used to have routine to get OOB at 5 AM to weigh person.  Making picture frames.  Weight is stable.  Usually walks daily 15 min.  Some fears of Covid.  Had thoughts of denial of it initially.  Talked to [place] about it.  Has worn a mask when shopping.  [name] takes her to groceries.   Still has intrusive thoughts that she's a lesbian and if she does not believe she'll go to hell.  Has these  thoughts a lot.  Still hearing voices saying "you're crazy" or "wow, that's fun".  Pt reports that mood is Anxious and describes anxiety as Moderate. Anxiety symptoms include: excessive and obsessive worry.  Paranoia. . Chronic intrusive dysphoric thoughts usually of sexual nature or that she's going to hell.  Pt reports some sleep issues. Pt reports that appetite is good. Pt reports that energy is no change and good. Concentration is good. Suicidal thoughts:  denied by patient.  Does not have a lot of interests chronically.  Anxious around people.  Chronic AH makes her anxious.   Worries about "uncleanness".  Not unusually anxious at home.  Has worries today she's afraid to discuss over the phone.   Does her own grocery shopping.  Sister helps with bills and appts.. Talks to her daily.   Little other social contact.  Some walking daily 15 min dailyh.  Weight 101#.  Doesn't eat much. Not very hungry.  Thinks weight is constant.  Not going out like ususal.  Still goes to [place] on Sundays.  B will call and do chores at times.  Admits she lies to herself at times.  Hard to believe she turned 68 yo yesterday.   Compliant and no med problems.  [name] helps her with store shopping and gas station.   Thinks sister may have gotten upset with her yesterday but she's not sure why.  Multiple prior psych hospitalizations before starting clozapine.  Review of Systems:  Review of Systems  Constitutional: Positive for appetite change. Negative for unexpected weight change.  Gastrointestinal: Positive for diarrhea. Negative for constipation.  Musculoskeletal: Positive for arthralgias.       Foot pain and plans to see a doctor  Neurological: Negative for tremors, weakness and headaches.  Psychiatric/Behavioral: Positive for behavioral problems, confusion, decreased concentration and hallucinations. Negative for agitation, dysphoric mood, self-injury, sleep disturbance and suicidal ideas. The patient is  nervous/anxious. The patient is not hyperactive.     Medications: I have reviewed the patient's current medications.  Current Outpatient Medications  Medication Sig Dispense Refill  . aspirin 81 MG tablet Take 81 mg by mouth daily.      . calcium elemental as carbonate (TUMS ULTRA 1000) 400 MG tablet Chew 1,000 mg by mouth daily.      . chlordiazePOXIDE (LIBRIUM) 10 MG capsule Take 1 capsule (10 mg total) by mouth 2 (two) times daily. 60 capsule 5  . Cholecalciferol (VITAMIN D3) 1000 UNITS CAPS Take by mouth daily.      . cloZAPine (CLOZARIL) 100 MG tablet 1 in am and 4 at night (Patient taking differently: Take 400 mg by mouth at bedtime. 1 in am and 4 at night) 150 tablet 11  . famotidine (PEPCID) 10 MG tablet Take 10 mg by mouth daily.     [name] ibandronate (BONIVA) 150 MG tablet SEE NOTES 3 tablet 3  . polyethylene glycol (MIRALAX / GLYCOLAX) 17 g packet Take 17 g by mouth daily as needed.    . simvastatin (ZOCOR) 40 MG tablet Take 1 tablet (40 mg total) by mouth daily at 6 PM. 90 tablet 3  . vitamin B-12 (CYANOCOBALAMIN) 1000 MCG tablet Take 1 tablet (1,000 mcg total) by mouth daily. 90 tablet 3  . magnesium citrate SOLN Take 1 Bottle by mouth daily.     No current facility-administered medications for this visit.     Medication Side Effects: None  Allergies: No Known Allergies  Past Medical History:  Diagnosis Date  . Anxiety   . BURSITIS, RIGHT KNEE   . Depression   . Dyskinesia of esophagus   . GAIT DISTURBANCE   . GERD   . HEMORRHOIDS, NOS   . HYPERLIPIDEMIA   . MENOPAUSAL SYNDROME   . Osteoporosis dx 03/2011   DEXA at gyn, started boniva in additon to Ca+D, changed to [place] 09/2012  . SCHIZOPHRENIA   . SYMPTOM, INCONTINENCE, URGE     Family History  Problem Relation Age of Onset  . Hypertension Mother   . Depression Mother   . Prostate cancer Father   . Colon cancer Brother 63  . Esophageal cancer Neg Hx   . Pancreatic cancer Neg Hx   . Rectal cancer Neg Hx    . Stomach cancer Neg Hx     Social History   Socioeconomic History  . Marital status: Single    Spouse name: Not on file  . Number of children: Not on file  . Years of education: Not on file  . Highest education level: Not on file  Occupational History  . Not on file  Social Needs  . Financial resource strain: Not on file  . Food insecurity    Worry: Not on file    Inability: Not on file  .  Transportation needs    Medical: Not on file    Non-medical: Not on file  Tobacco Use  . Smoking status: Never Smoker  . Smokeless tobacco: Never Used  Substance and Sexual Activity  . Alcohol use: No    Alcohol/week: 0.0 standard drinks  . Drug use: No  . Sexual activity: Not on file  Lifestyle  . Physical activity    Days per week: Not on file    Minutes per session: Not on file  . Stress: Not on file  Relationships  . Social Herbalist on phone: Not on file    Gets together: Not on file    Attends religious service: Not on file    Active member of club or organization: Not on file    Attends meetings of clubs or organizations: Not on file    Relationship status: Not on file  . Intimate partner violence    Fear of current or ex partner: Not on file    Emotionally abused: Not on file    Physically abused: Not on file    Forced sexual activity: Not on file  Other Topics Concern  . Not on file  Social History Narrative   Lives alone in single family home. Sister provides support and come to visits. Pt drives and indep ADLs.   sister [name] 85 yo B Johnny 21 yo.  Past Medical History, Surgical history, Social history, and Family history were reviewed and updated as appropriate.   Please see review of systems for further details on the patient's review from today.   Objective:   Physical Exam:  There were no vitals taken for this visit.  Physical Exam Constitutional:      General: She is not in acute distress.    Appearance: She is well-developed.   Musculoskeletal:        General: No deformity.  Neurological:     Mental Status: She is alert and oriented to person, place, and time.     Motor: No tremor.     Coordination: Coordination normal.     Gait: Gait abnormal.     Comments: Stereotypic compulsive style of walking with some retracing steps.  Psychiatric:        Attention and Perception: Attention normal. She is attentive. She perceives auditory hallucinations. She does not perceive visual hallucinations.        Mood and Affect: Mood is anxious. Mood is not depressed. Affect is flat and inappropriate. Affect is not labile, blunt or angry.        Speech: Speech is not rapid and pressured, delayed or slurred.        Behavior: Behavior normal. Behavior is not agitated.        Thought Content: Thought content is paranoid. Thought content does not include homicidal or suicidal ideation. Thought content does not include homicidal or suicidal plan.        Cognition and Memory: Cognition normal. She exhibits impaired recent memory.     Comments: Odd facial grimacing with intrusive thoughts.  Chronic stereotypic and repetitive gestures especially when walking. Chronically anxious and very poor social skills but not uncooperative. Rigid. Chronic severe TR psychosis. Poor insight and judgment fair. Obsesses on weight.  She's gradually lost weight.  Need to observe for developing eating disorder.  Affect chronically distressed. Stereotypic movments.    Brought picture frames she dressed up with crafts to show.  Lab Review:     Component Value Date/Time   NA  140 08/30/2017 1052   K 4.4 08/30/2017 1052   CL 104 08/30/2017 1052   CO2 31 08/30/2017 1052   GLUCOSE 89 08/30/2017 1052   BUN 16 08/30/2017 1052   CREATININE 0.79 08/30/2017 1052   CALCIUM 9.3 08/30/2017 1052   PROT 6.2 08/30/2017 1052   ALBUMIN 4.0 08/30/2017 1052   AST 20 08/30/2017 1052   ALT 18 08/30/2017 1052   ALKPHOS 91 08/30/2017 1052   BILITOT 0.3 08/30/2017  1052       Component Value Date/Time   WBC 5.1 11/21/2018 1031   WBC 5.6 08/30/2017 1052   RBC 4.36 11/21/2018 1031   RBC 4.33 08/30/2017 1052   HGB 12.5 11/21/2018 1031   HCT 37.8 11/21/2018 1031   PLT 201 11/21/2018 1031   MCV 87 11/21/2018 1031   MCH 28.7 11/21/2018 1031   MCHC 33.1 11/21/2018 1031   MCHC 32.9 08/30/2017 1052   RDW 13.3 11/21/2018 1031   LYMPHSABS 1.2 11/21/2018 1031   MONOABS 0.3 08/11/2014 1243   EOSABS 0.0 11/21/2018 1031   BASOSABS 0.0 11/21/2018 1031   ANC count has been stable. No results found for: POCLITH, LITHIUM   No results found for: PHENYTOIN, PHENOBARB, VALPROATE, CBMZ   .res Assessment: Plan:    Kamyra was seen today for hallucinations and diarrhea.  Diagnoses and all orders for this visit:  Paranoid schizophrenia ([place])  Generalized anxiety disorder  Drug-induced constipation  History of long-term treatment with high-risk medication    History of long-term treatment with high-risk medication   severe treatment resistant psychosis  Greater than 50% of 30 min face to face time with patient was spent on counseling and coordination of care. We discussed her Chronic severe schizophrenic symptoms of paranoia, AH and negative symptoms.  She has not improved nor changed significantly since the last visit nor significantly since when she was first seen in a number of years ago.. Not able to function or self-care without the help of her sister.  Have adjusted the clozapine levels up and down without much difference in her overall symptoms.She is currently taking clozapine 400 mg daily as she did not improve at the higher dosages.   Chronically easily overwhelmed.    She has chronic constipation as a SE but it's managed.  ANC has been stable for clozapine RX.  Disc risk of aplastic anemia. Continue monthly CBC.Disc compliance with her medications.  She has been consistent.  We discussed the short-term risks associated with  benzodiazepines including sedation and increased fall risk among others.  Discussed long-term side effect risk including dependence, potential withdrawal symptoms, and the potential eventual dose-related risk of dementia. She appears to benefit from the librium.  No med changes are likely to help.   Don't lose any more weight.  Pt starting to look malnourished.  Needs to eat emphasized.  CBT around dealing with voices and telling herself that it is the illness causing the voices and sometimes that helps her.  She was more open about the intrusive thoughts and voices today than she usually is.  Benefit from support noted.  Needs a lot of support and consistency bc of her anxiety and obsessions.  She seeks reassurance.  She feels chronically fragile.  She has no social support except for her family.  Consider fluvoxamine trial again for obsessive anxiety.  Will need to watch DDI with clozapine.  Defer.  OK to stop magnesium tablets but will need to take Miralax daily to prevent constipation.   If  you get diarrhea from daily Miralax then use 3/4 of dispenser cap in liquid instead of the full dispenser cup daily.  This appt was 30 mins.  herefore FU every 10 weeks  Lynder Parents, MD, DFAPA   Please see After Visit Summary for patient specific instructions.  Future Appointments  Date Time Provider [place]  03/12/2019  2:20 PM Hoyt Koch, MD [id number]  04/24/2019  3:00 PM Cottle, Billey Co., MD [facility] None    No orders of the defined types were placed in this encounter.     -------------------------------

## 2019-02-14 ENCOUNTER — Encounter: Payer: Self-pay | Admitting: Psychiatry

## 2019-03-04 ENCOUNTER — Encounter: Payer: Self-pay | Admitting: Internal Medicine

## 2019-03-12 ENCOUNTER — Other Ambulatory Visit: Payer: Self-pay

## 2019-03-12 ENCOUNTER — Encounter: Payer: Self-pay | Admitting: Internal Medicine

## 2019-03-12 ENCOUNTER — Ambulatory Visit (INDEPENDENT_AMBULATORY_CARE_PROVIDER_SITE_OTHER)
Admission: RE | Admit: 2019-03-12 | Discharge: 2019-03-12 | Disposition: A | Discharge status: Home / Self Care | Payer: Medicare Other | Source: Ambulatory Visit | Attending: Internal Medicine | Admitting: Internal Medicine

## 2019-03-12 ENCOUNTER — Ambulatory Visit (INDEPENDENT_AMBULATORY_CARE_PROVIDER_SITE_OTHER): Payer: Medicare Other | Admitting: Internal Medicine

## 2019-03-12 ENCOUNTER — Other Ambulatory Visit (INDEPENDENT_AMBULATORY_CARE_PROVIDER_SITE_OTHER): Payer: Medicare Other

## 2019-03-12 VITALS — BP 110/80 | HR 108 | Temp 98.6°F | Ht 61.0 in | Wt 114.0 lb

## 2019-03-12 DIAGNOSIS — M810 Age-related osteoporosis without current pathological fracture: Secondary | ICD-10-CM

## 2019-03-12 DIAGNOSIS — R159 Full incontinence of feces: Secondary | ICD-10-CM

## 2019-03-12 DIAGNOSIS — E538 Deficiency of other specified B group vitamins: Secondary | ICD-10-CM

## 2019-03-12 DIAGNOSIS — E559 Vitamin D deficiency, unspecified: Secondary | ICD-10-CM | POA: Diagnosis not present

## 2019-03-12 DIAGNOSIS — F209 Schizophrenia, unspecified: Secondary | ICD-10-CM | POA: Diagnosis not present

## 2019-03-12 DIAGNOSIS — E785 Hyperlipidemia, unspecified: Secondary | ICD-10-CM | POA: Diagnosis not present

## 2019-03-12 DIAGNOSIS — Z23 Encounter for immunization: Secondary | ICD-10-CM | POA: Diagnosis not present

## 2019-03-12 DIAGNOSIS — Z0000 Encounter for general adult medical examination without abnormal findings: Secondary | ICD-10-CM

## 2019-03-12 DIAGNOSIS — R7301 Impaired fasting glucose: Secondary | ICD-10-CM

## 2019-03-12 LAB — COMPREHENSIVE METABOLIC PANEL
ALT: 17 U/L (ref 0–35)
AST: 18 U/L (ref 0–37)
Albumin: 4.4 g/dL (ref 3.5–5.2)
Alkaline Phosphatase: 98 U/L (ref 39–117)
BUN: 20 mg/dL (ref 6–23)
CO2: 31 mEq/L (ref 19–32)
Calcium: 9.4 mg/dL (ref 8.4–10.5)
Chloride: 103 mEq/L (ref 96–112)
Creatinine, Ser: 0.86 mg/dL (ref 0.40–1.20)
GFR: 66 mL/min (ref >60.00)
Glucose, Bld: 105 mg/dL — ABNORMAL HIGH (ref 70–99)
Potassium: 4.2 mEq/L (ref 3.5–5.1)
Sodium: 141 mEq/L (ref 135–145)
Total Bilirubin: 0.3 mg/dL (ref 0.2–1.2)
Total Protein: 6.4 g/dL (ref 6.0–8.3)

## 2019-03-12 LAB — VITAMIN B12: Vitamin B-12: >1500 pg/mL — ABNORMAL HIGH (ref 211–911)

## 2019-03-12 LAB — CBC
HCT: 39.2 % (ref 36.0–46.0)
Hemoglobin: 13 g/dL (ref 12.0–15.0)
MCHC: 33.2 g/dL (ref 30.0–36.0)
MCV: 87.7 fl (ref 78.0–100.0)
Platelets: 195 10*3/uL (ref 150.0–400.0)
RBC: 4.47 Mil/uL (ref 3.87–5.11)
RDW: 13.5 % (ref 11.5–15.5)
WBC: 5.5 10*3/uL (ref 4.0–10.5)

## 2019-03-12 LAB — TSH: TSH: 2.98 u[IU]/mL (ref 0.35–4.50)

## 2019-03-12 LAB — VITAMIN D 25 HYDROXY (VIT D DEFICIENCY, FRACTURES): VITD: 44.05 ng/mL (ref 30.00–100.00)

## 2019-03-12 LAB — T4, FREE: Free T4: 0.81 ng/dL (ref 0.60–1.60)

## 2019-03-12 LAB — LIPID PANEL
Cholesterol: 146 mg/dL (ref 0–200)
HDL: 46 mg/dL (ref >39.00)
LDL Cholesterol: 60 mg/dL (ref 0–99)
NonHDL: 100.05
Total CHOL/HDL Ratio: 3
Triglycerides: 199 mg/dL — ABNORMAL HIGH (ref 0.0–149.0)
VLDL: 39.8 mg/dL (ref 0.0–40.0)

## 2019-03-12 NOTE — Assessment & Plan Note (Signed)
Seeing psych for management. No recent medication changes.

## 2019-03-12 NOTE — Patient Instructions (Signed)
Health Maintenance, Female Adopting a healthy lifestyle and getting preventive care are important in promoting health and wellness. Ask your health care provider about:  The right schedule for you to have regular tests and exams.  Things you can do on your own to prevent diseases and keep yourself healthy. What should I know about diet, weight, and exercise? Eat a healthy diet   Eat a diet that includes plenty of vegetables, fruits, low-fat dairy products, and lean protein.  Do not eat a lot of foods that are high in solid fats, added sugars, or sodium. Maintain a healthy weight Body mass index (BMI) is used to identify weight problems. It estimates body fat based on height and weight. Your health care provider can help determine your BMI and help you achieve or maintain a healthy weight. Get regular exercise Get regular exercise. This is one of the most important things you can do for your health. Most adults should:  Exercise for at least 150 minutes each week. The exercise should increase your heart rate and make you sweat (moderate-intensity exercise).  Do strengthening exercises at least twice a week. This is in addition to the moderate-intensity exercise.  Spend less time sitting. Even light physical activity can be beneficial. Watch cholesterol and blood lipids Have your blood tested for lipids and cholesterol at 66 years of age, then have this test every 5 years. Have your cholesterol levels checked more often if:  Your lipid or cholesterol levels are high.  You are older than 66 years of age.  You are at high risk for heart disease. What should I know about cancer screening? Depending on your health history and family history, you may need to have cancer screening at various ages. This may include screening for:  Breast cancer.  Cervical cancer.  Colorectal cancer.  Skin cancer.  Lung cancer. What should I know about heart disease, diabetes, and high blood  pressure? Blood pressure and heart disease  High blood pressure causes heart disease and increases the risk of stroke. This is more likely to develop in people who have high blood pressure readings, are of African descent, or are overweight.  Have your blood pressure checked: ? Every 3-5 years if you are 18-39 years of age. ? Every year if you are 40 years old or older. Diabetes Have regular diabetes screenings. This checks your fasting blood sugar level. Have the screening done:  Once every three years after age 40 if you are at a normal weight and have a low risk for diabetes.  More often and at a younger age if you are overweight or have a high risk for diabetes. What should I know about preventing infection? Hepatitis B If you have a higher risk for hepatitis B, you should be screened for this virus. Talk with your health care provider to find out if you are at risk for hepatitis B infection. Hepatitis C Testing is recommended for:  Everyone born from 1945 through 1965.  Anyone with known risk factors for hepatitis C. Sexually transmitted infections (STIs)  Get screened for STIs, including gonorrhea and chlamydia, if: ? You are sexually active and are younger than 66 years of age. ? You are older than 66 years of age and your health care provider tells you that you are at risk for this type of infection. ? Your sexual activity has changed since you were last screened, and you are at increased risk for chlamydia or gonorrhea. Ask your health care provider if   you are at risk.  Ask your health care provider about whether you are at high risk for HIV. Your health care provider may recommend a prescription medicine to help prevent HIV infection. If you choose to take medicine to prevent HIV, you should first get tested for HIV. You should then be tested every 3 months for as long as you are taking the medicine. Pregnancy  If you are about to stop having your period (premenopausal) and  you may become pregnant, seek counseling before you get pregnant.  Take 400 to 800 micrograms (mcg) of folic acid every day if you become pregnant.  Ask for birth control (contraception) if you want to prevent pregnancy. Osteoporosis and menopause Osteoporosis is a disease in which the bones lose minerals and strength with aging. This can result in bone fractures. If you are 65 years old or older, or if you are at risk for osteoporosis and fractures, ask your health care provider if you should:  Be screened for bone loss.  Take a calcium or vitamin D supplement to lower your risk of fractures.  Be given hormone replacement therapy (HRT) to treat symptoms of menopause. Follow these instructions at home: Lifestyle  Do not use any products that contain nicotine or tobacco, such as cigarettes, e-cigarettes, and chewing tobacco. If you need help quitting, ask your health care provider.  Do not use street drugs.  Do not share needles.  Ask your health care provider for help if you need support or information about quitting drugs. Alcohol use  Do not drink alcohol if: ? Your health care provider tells you not to drink. ? You are pregnant, may be pregnant, or are planning to become pregnant.  If you drink alcohol: ? Limit how much you use to 0-1 drink a day. ? Limit intake if you are breastfeeding.  Be aware of how much alcohol is in your drink. In the [place] one drink equals one 12 oz bottle of beer (355 mL), one 5 oz glass of wine (148 mL), or one 1 oz glass of hard liquor (44 mL). General instructions  Schedule regular health, dental, and eye exams.  Stay current with your vaccines.  Tell your health care provider if: ? You often feel depressed. ? You have ever been abused or do not feel safe at home. Summary  Adopting a healthy lifestyle and getting preventive care are important in promoting health and wellness.  Follow your health care provider's instructions about healthy  diet, exercising, and getting tested or screened for diseases.  Follow your health care provider's instructions on monitoring your cholesterol and blood pressure. This information is not intended to replace advice given to you by your health care provider. Make sure you discuss any questions you have with your health care provider. Document Released: 11/08/2010 Document Revised: 04/18/2018 Document Reviewed: 04/18/2018 Elsevier Patient Education  2020 [employer]  

## 2019-03-12 NOTE — Assessment & Plan Note (Signed)
Checking lipid panel and taking simvastatin 40 mg daily.

## 2019-03-12 NOTE — Progress Notes (Signed)
Subjective:   Patient ID: Nancy Brooks, female    DOB: Feb 08, 1951, 68 y.o.   MRN: [id number]  HPI Here for medicare wellness and physical, some new complaints. Please see A/P for status and treatment of chronic medical problems.   HPI #3: Here for new anal incontinence (happening in her sleep with bowel movements getting on the sheets, sister helps to provide history, getting on the bathroom floor when she is going in there in the morning and looks to be normal BM not diarrhea, she does have chronic constipation, started 2 months or so ago, no new foods or medication changes, she takes 3-4 different things otc to help bowels and they have recently stopped magnesium product to see if this will help, taking fiber and miralax also, denies blood in stool, recent colonoscopy 2017 with a few polyps with internal and external hemorrhoids, not due for recall until 2022, denies pain with BM, denies abdominal pain, weight is down about 10 pounds since last year, she admits to no changes but is walking consistently now, good appetite, denies daytime incontinence and admits to having BM ).   Diet: heart healthy Physical activity: sedentary, walks 15 minutes daily Depression/mood screen: negative Hearing: intact to whispered voice Visual acuity: grossly normal, performs annual eye exam  ADLs: capable Fall risk: moderate Home safety: good Cognitive evaluation: intact to orientation, naming, recall and repetition EOL planning: adv directives discussed    Office Visit from 03/12/2019 in [place]  PHQ-2 Total Score  0        Office Visit from 07/17/2013 in [place]  PHQ-9 Total Score  6     I have personally reviewed and have noted 1. The patient's medical and social history - reviewed today no changes 2. Their use of alcohol, tobacco or illicit drugs 3. Their current medications and supplements 4. The patient's functional ability including  ADL's, fall risks, home safety risks and hearing or visual impairment. 5. Diet and physical activities 6. Evidence for depression or mood disorders 7. Care team reviewed and updated  Patient Care Team: Hoyt Koch, MD as PCP - General (Internal Medicine) Lafayette Dragon, MD (Inactive) as Consulting Physician (Gastroenterology) Cottle, Billey Co., MD as Consulting Physician (Psychiatry) Marylynn Pearson, MD (Obstetrics and Gynecology) Past Medical History:  Diagnosis Date  . Anxiety   . BURSITIS, RIGHT KNEE   . Depression   . Dyskinesia of esophagus   . GAIT DISTURBANCE   . GERD   . HEMORRHOIDS, NOS   . HYPERLIPIDEMIA   . MENOPAUSAL SYNDROME   . Osteoporosis dx 03/2011   DEXA at gyn, started boniva in additon to Ca+D, changed to [place] 09/2012  . SCHIZOPHRENIA   . SYMPTOM, INCONTINENCE, URGE    Past Surgical History:  Procedure Laterality Date  . BREAST SURGERY  05/09/1998   left breast biopsy  . COLONOSCOPY  03-16-2010   Family History  Problem Relation Age of Onset  . Hypertension Mother   . Depression Mother   . Prostate cancer Father   . Colon cancer Brother 67  . Esophageal cancer Neg Hx   . Pancreatic cancer Neg Hx   . Rectal cancer Neg Hx   . Stomach cancer Neg Hx     Review of Systems  Unable to perform ROS: Psychiatric disorder  Constitutional: Negative.   HENT: Negative.   Eyes: Negative.   Respiratory: Negative for cough, chest tightness and shortness of breath.  Cardiovascular: Negative for chest pain, palpitations and leg swelling.  Gastrointestinal: Negative for abdominal distention, abdominal pain, anal bleeding, blood in stool, constipation, diarrhea, nausea, rectal pain and vomiting.       Stool incontinence  Musculoskeletal: Negative.   Skin: Negative.   Neurological: Negative.     Objective:  Physical Exam Constitutional:      Appearance: She is well-developed.  HENT:     Head: Normocephalic and atraumatic.  Neck:      Musculoskeletal: Normal range of motion.  Cardiovascular:     Rate and Rhythm: Normal rate and regular rhythm.  Pulmonary:     Effort: Pulmonary effort is normal. No respiratory distress.     Breath sounds: Normal breath sounds. No wheezing or rales.  Abdominal:     General: Bowel sounds are normal. There is no distension.     Palpations: Abdomen is soft.     Tenderness: There is no abdominal tenderness. There is no rebound.  Skin:    General: Skin is warm and dry.  Neurological:     Mental Status: She is alert and oriented to person, place, and time.     Coordination: Coordination normal.     Vitals:   03/12/19 1405  BP: 110/80  Pulse: (!) 108  Temp: 98.6 F (37 C)  TempSrc: Oral  SpO2: 95%  Weight: 114 lb (51.7 kg)  Height: 5\' 1"  (1.549 m)    Assessment & Plan:  Prevnar 13 given at visit

## 2019-03-12 NOTE — Assessment & Plan Note (Signed)
Taking boniva and has been for >5 years. At next DEXA can consider change to prolia if desired.

## 2019-03-12 NOTE — Assessment & Plan Note (Signed)
Flu shot complete. Pneumonia 13 given and due 23 next year. Shingrix counseled. Tetanus due 2026. Colonoscopy due 2022. Mammogram due 2022, pap smear aged out and dexa due 2022. Counseled about sun safety and mole surveillance. Counseled about the dangers of distracted driving. Given 10 year screening recommendations.

## 2019-03-12 NOTE — Assessment & Plan Note (Signed)
Checking B12 and taking oral replacement at this time.

## 2019-03-12 NOTE — Assessment & Plan Note (Signed)
Checking x-ray and labs today to rule out changes. Recent colonoscopy 2017 without masses. It is unlikely that hemorrhoids are causing this.

## 2019-03-13 ENCOUNTER — Encounter: Payer: Self-pay | Admitting: Internal Medicine

## 2019-03-13 LAB — HEMOGLOBIN A1C: Hgb A1c MFr Bld: 5.6 % (ref 4.6–6.5)

## 2019-03-28 ENCOUNTER — Ambulatory Visit: Payer: Medicare Other | Admitting: Physician Assistant

## 2019-04-11 ENCOUNTER — Other Ambulatory Visit: Payer: Self-pay

## 2019-04-11 ENCOUNTER — Ambulatory Visit (INDEPENDENT_AMBULATORY_CARE_PROVIDER_SITE_OTHER): Payer: Medicare Other | Admitting: Physician Assistant

## 2019-04-11 ENCOUNTER — Encounter: Payer: Self-pay | Admitting: Physician Assistant

## 2019-04-11 VITALS — BP 112/74 | HR 113 | Temp 98.8°F | Ht 61.0 in | Wt 116.0 lb

## 2019-04-11 DIAGNOSIS — K5900 Constipation, unspecified: Secondary | ICD-10-CM

## 2019-04-11 MED ORDER — LINACLOTIDE 72 MCG PO CAPS: 72.0000 ug | ORAL_CAPSULE | Freq: Every day | ORAL | 3 refills | Status: DC

## 2019-04-11 NOTE — Patient Instructions (Addendum)
If you are age 67 or older, your body mass index should be between 23-30. Your Body mass index is 21.92 kg/m. If this is out of the aforementioned range listed, please consider follow up with your Primary Care Provider.  We have sent the following medications to your pharmacy for you to pick up at your convenience:  Ellouise Newer, PA-C recommends that you complete a bowel purge (to clean out your bowels). Please do the following:  Use the sample of Clenpiq for your bowel purge. You will drink both bottles, just shake and drink them, follow with water as recommended.   We have also provided you with samples of Linzess- use these first before your prescription, take 1 tablet daily   We have sent the following medications to your pharmacy for you to pick up at your convenience:  Linzess 26mcg has been sent to your pharmacy.  Stop using your Miralax and prune juice while taking Linzess.  Contact our office in 4 weeks to schedule a follow up with Anderson Malta. [phone]  Thank you for choosing me and [facility]

## 2019-04-11 NOTE — Progress Notes (Signed)
Chief Complaint: Constipation with fecal incontinence  HPI:    Nancy Brooks is a 68 year old female with a past medical history as listed below, who was referred to me by Hoyt Koch, * for a complaint of constipation with fecal incontinence.      10/08/2015 colonoscopy Dr. Havery Moros with nonthrombosed external hemorrhoids, 1 8 mm polyp in the ascending colon, 1 6 mm polyp in the transverse colon, diverticulosis in left colon and nonbleeding internal hemorrhoids.  Pathology with a sessile serrated polyp/adenoma.  Repeat colonoscopy recommended in 5 years.    03/12/19 abdominal x-ray with large colonic stool burden.  No acute findings.    04/08/2019 office visit with primary care.  At that time discussed incontinence happening in her sleep with bowel movements getting on the sheets, discussed constipation and overflow at that time.  Increase of MiraLAX was recommended.    Today, the patient presents to clinic accompanied by her sister who does assist with history.  They explain that for the past several months patient has been having trouble with some fecal incontinence.  Initially describing this as only in her sleep at night and would run out onto the sheets, but now over the past month or so she has had some episodes during the day as well.  Saw PCP who increased MiraLAX to 2 capfuls in the morning but sister tells me this resulted in 5 liquidy loose urgent stools throughout the day requiring depends.  They tell me the patient has been constipated for a long time ever since being started on Clozapine.  Typically this was resolved with MiraLAX and 3 prunes a day, but now having issues.    Patient does have schizophrenia and lives by herself.    Denies fever, chills, blood in her stool, abdominal pain or symptoms that awaken her from sleep.        Past Medical History:  Diagnosis Date  . Anxiety   . BURSITIS, RIGHT KNEE   . Depression   . Dyskinesia of esophagus   . GAIT DISTURBANCE   .  GERD   . HEMORRHOIDS, NOS   . HYPERLIPIDEMIA   . MENOPAUSAL SYNDROME   . Osteoporosis dx 03/2011   DEXA at gyn, started boniva in additon to Ca+D, changed to [place] 09/2012  . SCHIZOPHRENIA   . SYMPTOM, INCONTINENCE, URGE     Past Surgical History:  Procedure Laterality Date  . BREAST SURGERY  05/09/1998   left breast biopsy  . COLONOSCOPY  03-16-2010    Current Outpatient Medications  Medication Sig Dispense Refill  . aspirin 81 MG tablet Take 81 mg by mouth daily.      . calcium elemental as carbonate (TUMS ULTRA 1000) 400 MG tablet Chew 1,000 mg by mouth daily.      . chlordiazePOXIDE (LIBRIUM) 10 MG capsule Take 1 capsule (10 mg total) by mouth 2 (two) times daily. 60 capsule 5  . Cholecalciferol (VITAMIN D3) 1000 UNITS CAPS Take by mouth daily.      . cloZAPine (CLOZARIL) 100 MG tablet 1 in am and 4 at night (Patient taking differently: Take 400 mg by mouth at bedtime. 1 in am and 4 at night) 150 tablet 11  . famotidine (PEPCID) 10 MG tablet Take 10 mg by mouth daily.     [name] ibandronate (BONIVA) 150 MG tablet SEE NOTES 3 tablet 3  . polyethylene glycol (MIRALAX / GLYCOLAX) 17 g packet Take 17 g by mouth daily as needed.    . simvastatin (  ZOCOR) 40 MG tablet Take 1 tablet (40 mg total) by mouth daily at 6 PM. 90 tablet 3  . vitamin B-12 (CYANOCOBALAMIN) 1000 MCG tablet Take 1 tablet (1,000 mcg total) by mouth daily. 90 tablet 3   No current facility-administered medications for this visit.     Allergies as of 04/11/2019  . (No Known Allergies)    Family History  Problem Relation Age of Onset  . Hypertension Mother   . Depression Mother   . Prostate cancer Father   . Colon cancer Brother 54  . Esophageal cancer Neg Hx   . Pancreatic cancer Neg Hx   . Rectal cancer Neg Hx   . Stomach cancer Neg Hx     Social History   Socioeconomic History  . Marital status: Single    Spouse name: Not on file  . Number of children: Not on file  . Years of education: Not on file  .  Highest education level: Not on file  Occupational History  . Not on file  Social Needs  . Financial resource strain: Not on file  . Food insecurity    Worry: Not on file    Inability: Not on file  . Transportation needs    Medical: Not on file    Non-medical: Not on file  Tobacco Use  . Smoking status: Never Smoker  . Smokeless tobacco: Never Used  Substance and Sexual Activity  . Alcohol use: No    Alcohol/week: 0.0 standard drinks  . Drug use: No  . Sexual activity: Not on file  Lifestyle  . Physical activity    Days per week: Not on file    Minutes per session: Not on file  . Stress: Not on file  Relationships  . Social Herbalist on phone: Not on file    Gets together: Not on file    Attends religious service: Not on file    Active member of club or organization: Not on file    Attends meetings of clubs or organizations: Not on file    Relationship status: Not on file  . Intimate partner violence    Fear of current or ex partner: Not on file    Emotionally abused: Not on file    Physically abused: Not on file    Forced sexual activity: Not on file  Other Topics Concern  . Not on file  Social History Narrative   Lives alone in single family home. Sister provides support and come to visits. Pt drives and indep ADLs.     Review of Systems:    Constitutional: No weight loss, fever or chills Skin: No rash  Cardiovascular: No chest pain Respiratory: No SOB  Gastrointestinal: See HPI and otherwise negative Genitourinary: No dysuria  Neurological: No headache, dizziness or syncope Musculoskeletal: No new muscle or joint pain Hematologic: No bleeding  Psychiatric: Schizophrenia   Physical Exam:  Vital signs: BP 112/74 (BP Location: Left Arm, Patient Position: Sitting, Cuff Size: Normal)   Pulse (!) 113   Temp 98.8 F (37.1 C) (Oral)   Ht 5\' 1"  (1.549 m)   Wt 116 lb (52.6 kg)   BMI 21.92 kg/m   Constitutional: Caucasian female appears to be in  NAD, Well developed, Well nourished, alert and cooperative Head:  Normocephalic and atraumatic. Eyes:   PEERL, EOMI. No icterus. Conjunctiva pink. Ears:  Normal auditory acuity. Neck:  Supple Throat: Oral cavity and pharynx without inflammation, swelling or lesion.  Respiratory: Respirations even  and unlabored. Lungs clear to auscultation bilaterally.   No wheezes, crackles, or rhonchi.  Cardiovascular: Normal S1, S2. No MRG. Regular rate and rhythm. No peripheral edema, cyanosis or pallor.  Gastrointestinal:  Soft, nondistended, mild generalized ttp. No rebound or guarding. Normal bowel sounds. No appreciable masses or hepatomegaly. Rectal:  Not performed.  Msk:  Symmetrical without gross deformities. Without edema, no deformity or joint abnormality.  Neurologic:  Alert and  oriented x4;  grossly normal neurologically.  Skin:   Dry and intact without significant lesions or rashes. Psychiatric: blank affect  MOST RECENT LABS AND IMAGING: CBC    Component Value Date/Time   WBC 5.5 03/12/2019 1455   RBC 4.47 03/12/2019 1455   HGB 13.0 03/12/2019 1455   HGB 12.5 11/21/2018 1031   HCT 39.2 03/12/2019 1455   HCT 37.8 11/21/2018 1031   PLT 195.0 03/12/2019 1455   PLT 201 11/21/2018 1031   MCV 87.7 03/12/2019 1455   MCV 87 11/21/2018 1031   MCH 28.7 11/21/2018 1031   MCHC 33.2 03/12/2019 1455   RDW 13.5 03/12/2019 1455   RDW 13.3 11/21/2018 1031   LYMPHSABS 1.2 11/21/2018 1031   MONOABS 0.3 08/11/2014 1243   EOSABS 0.0 11/21/2018 1031   BASOSABS 0.0 11/21/2018 1031    CMP     Component Value Date/Time   NA 141 03/12/2019 1455   K 4.2 03/12/2019 1455   CL 103 03/12/2019 1455   CO2 31 03/12/2019 1455   GLUCOSE 105 (H) 03/12/2019 1455   BUN 20 03/12/2019 1455   CREATININE 0.86 03/12/2019 1455   CALCIUM 9.4 03/12/2019 1455   PROT 6.4 03/12/2019 1455   ALBUMIN 4.4 03/12/2019 1455   AST 18 03/12/2019 1455   ALT 17 03/12/2019 1455   ALKPHOS 98 03/12/2019 1455   BILITOT 0.3  03/12/2019 1455    Assessment: 1.  Constipation: With overflow incontinence, confirmed on abdominal x-ray 03/12/2019 1 MiraLAX increased resulted in loose stools; likely related to Clozapine  Plan: 1.  Would recommend we do a bowel purge.  Provided the patient with a sample of Clenpiq.  Sister tells me they will try to do this this weekend. 2.  After bowel prep patient will start Linzess 72 mcg daily.  Provided samples and also a prescription #30 with 3 refills. 3.  Explained that while on the [place] patient does not need to be taking MiraLAX or prunes. 4.  Explained that patient should try the Linzess for at least 2 weeks.  They can stay in contact with our office to let us know how it is going. 5.  Patient to follow in clinic with me in 4 to 6 weeks or sooner if necessary.  Ellouise Newer, PA-C [facility] 04/11/2019, 11:32 AM  Cc: Hoyt Koch, *

## 2019-04-11 NOTE — Progress Notes (Signed)
Agree with assessment and plan as outlined.  

## 2019-04-12 ENCOUNTER — Encounter: Payer: Self-pay | Admitting: Psychiatry

## 2019-04-14 ENCOUNTER — Other Ambulatory Visit: Payer: Self-pay | Admitting: Psychiatry

## 2019-04-22 ENCOUNTER — Telehealth: Payer: Self-pay

## 2019-04-22 NOTE — Telephone Encounter (Signed)
Called back and spoke with patient's sister, per patient's request. States she has been eating normal since her colonoscopy, but is getting uncomfortable and feels constipated even with the daily Linzess. I suggested up to 3 doses of Miralax until she gets a soft-formed stool and then go back to just the Linzess. Asked her if no results in 2 days, to call us back.

## 2019-04-24 ENCOUNTER — Other Ambulatory Visit: Payer: Self-pay

## 2019-04-24 ENCOUNTER — Encounter: Payer: Self-pay | Admitting: Psychiatry

## 2019-04-24 ENCOUNTER — Ambulatory Visit (INDEPENDENT_AMBULATORY_CARE_PROVIDER_SITE_OTHER): Payer: Medicare Other | Admitting: Psychiatry

## 2019-04-24 DIAGNOSIS — K5903 Drug induced constipation: Secondary | ICD-10-CM | POA: Diagnosis not present

## 2019-04-24 DIAGNOSIS — F200 Paranoid schizophrenia: Secondary | ICD-10-CM | POA: Diagnosis not present

## 2019-04-24 DIAGNOSIS — Z79899 Other long term (current) drug therapy: Secondary | ICD-10-CM

## 2019-04-24 DIAGNOSIS — F411 Generalized anxiety disorder: Secondary | ICD-10-CM | POA: Diagnosis not present

## 2019-04-24 MED ORDER — CHLORDIAZEPOXIDE HCL 10 MG PO CAPS: 10.0000 mg | ORAL_CAPSULE | Freq: Two times a day (BID) | ORAL | 5 refills | Status: DC

## 2019-04-24 NOTE — Progress Notes (Signed)
MODEAN YAUGER [id number] 08/14/1950 68 y.o.  Subjective:   Patient ID:  Nancy Brooks is a 68 y.o. (DOB Oct 31, 1950) female.  Chief Complaint:  Chief Complaint  Patient presents with  . Follow-up    Medication Management  . Schizophrenia    Medication Management  . Paranoid    Medication Management  . Hallucinations  . Anxiety    HPI  Nancy Brooks presentse today for follow-up of schizophrenia with severe negative symptoms.  Last seen October 6 , 2020.  No med changes were made.  Except she was given approval to discontinue magnesium tablets as long as her constipation remained under control with MiraLAX.  Brought in artificial flower arrangement she made to show.  Has a list of concerns written down. Had a flat tire and she noticed it.  Also got locked out of her house.    GI doctor, Ellouise Newer, PA addressed BM in bed overnight and Linzess has been added.  Had gone over a week without BM but had movement yesterday.   Heard people outside at night and frightened her.  But only happened once. Still has bad thoughts intrusively over many years.  Wants to seek reassurance they are not true with the MD.  Still has voices without changes.  Intrusive words that are disturbing about family.  Some are intrusive thoughts and some of disturbing words are voices.  She tries to ignore the thoughts and voices.  Heard a voice say "bc you won't accept responsibiity".  Not sure what it meant but frightened her.  Voices will call her "crazy".     Gained some weight with intention.  Mostly consistent with meds. Disc her ideal weight .   She says it's 100#. Still gets hot easily.    Has several antiques in the house.  Says  Meds will disappear and then appear out of nowhere in her  House at times that upsets her.  Also episode many years of hearing a knock on outside of the house but she didn't go  Out  And doesn't know what it was.  She'll call 911 if threatened.  Nothing like these  scared her lately.    Thinks she's getting more sleep now.  Doesn't know how many hours.  Goes to bed 10 pm.  Gets OOB at different times.  Used to have routine to get OOB at 5 AM to weigh person.  Making picture frames.  Weight is stable.  Usually walks daily 15 min.  Some fears of Covid.  Had thoughts of denial of it initially.  Talked to [place] about it.  Has worn a mask when shopping.  [name] takes her to groceries.   Still has intrusive thoughts that she's a lesbian and if she does not believe she'll go to hell.  Has these thoughts a lot.  Still hearing voices saying "you're crazy" or "wow, that's fun".  Pt reports that mood is Anxious and describes anxiety as Moderate. Anxiety symptoms include: excessive and obsessive worry.  Paranoia. . Chronic intrusive dysphoric thoughts usually of sexual nature or that she's going to hell.  Pt reports some sleep issues. Pt reports that appetite is good. Pt reports that energy is no change and good. Concentration is good. Suicidal thoughts:  denied by patient.  Does not have a lot of interests chronically.  Anxious around people.  Chronic AH makes her anxious.   Worries about "uncleanness".  Not unusually anxious at home.  Has worries today she's afraid  to discuss over the phone.   Does her own grocery shopping.  Sister helps with bills and appts.. Talks to her daily.   Little other social contact.  Some walking daily 15 min dailyh.  Weight 101#.  Doesn't eat much. Not very hungry.  Thinks weight is constant.  Not going out like ususal.  Still goes to [place] on Sundays.  B will call and do chores at times.  Admits she lies to herself at times.  Hard to believe she turned 68 yo yesterday.   Compliant and no med problems. [name] helps her with store shopping and gas station.   Thinks sister may have gotten upset with her yesterday but she's not sure why.  Multiple prior psych hospitalizations before starting clozapine.  Review of Systems:  Review of  Systems  Constitutional: Positive for appetite change. Negative for unexpected weight change.  Gastrointestinal: Positive for diarrhea. Negative for constipation.  Musculoskeletal: Positive for arthralgias.       Foot pain and plans to see a doctor  Neurological: Negative for tremors, weakness and headaches.  Psychiatric/Behavioral: Positive for behavioral problems, confusion, decreased concentration and hallucinations. Negative for agitation, dysphoric mood, self-injury, sleep disturbance and suicidal ideas. The patient is nervous/anxious. The patient is not hyperactive.     Medications: I have reviewed the patient's current medications.  Current Outpatient Medications  Medication Sig Dispense Refill  . aspirin 81 MG tablet Take 81 mg by mouth daily.      . calcium elemental as carbonate (TUMS ULTRA 1000) 400 MG tablet Chew 1,000 mg by mouth daily.      . chlordiazePOXIDE (LIBRIUM) 10 MG capsule TAKE 1 CAPSULE(10 MG) BY MOUTH TWICE DAILY 60 capsule 1  . Cholecalciferol (VITAMIN D3) 1000 UNITS CAPS Take by mouth daily.      . cloZAPine (CLOZARIL) 100 MG tablet 1 in am and 4 at night (Patient taking differently: Take 500 mg by mouth at bedtime. 1 in am and 4 at night) 150 tablet 11  . famotidine (PEPCID) 10 MG tablet Take 10 mg by mouth daily.     [name] ibandronate (BONIVA) 150 MG tablet SEE NOTES 3 tablet 3  . linaclotide (LINZESS) 72 MCG capsule Take 1 capsule (72 mcg total) by mouth daily. 30 capsule 3  . polyethylene glycol (MIRALAX / GLYCOLAX) 17 g packet Take 17 g by mouth daily as needed.    . simvastatin (ZOCOR) 40 MG tablet Take 1 tablet (40 mg total) by mouth daily at 6 PM. 90 tablet 3  . vitamin B-12 (CYANOCOBALAMIN) 1000 MCG tablet Take 1 tablet (1,000 mcg total) by mouth daily. 90 tablet 3   No current facility-administered medications for this visit.    Medication Side Effects: None.  Unless GI.  Allergies: No Known Allergies  Past Medical History:  Diagnosis Date  .  Anxiety   . BURSITIS, RIGHT KNEE   . Depression   . Dyskinesia of esophagus   . GAIT DISTURBANCE   . GERD   . HEMORRHOIDS, NOS   . HYPERLIPIDEMIA   . MENOPAUSAL SYNDROME   . Osteoporosis dx 03/2011   DEXA at gyn, started boniva in additon to Ca+D, changed to [place] 09/2012  . SCHIZOPHRENIA   . SYMPTOM, INCONTINENCE, URGE     Family History  Problem Relation Age of Onset  . Hypertension Mother   . Depression Mother   . Prostate cancer Father   . Colon cancer Brother 90  . Esophageal cancer Neg Hx   .  Pancreatic cancer Neg Hx   . Rectal cancer Neg Hx   . Stomach cancer Neg Hx     Social History   Socioeconomic History  . Marital status: Single    Spouse name: Not on file  . Number of children: Not on file  . Years of education: Not on file  . Highest education level: Not on file  Occupational History  . Not on file  Tobacco Use  . Smoking status: Never Smoker  . Smokeless tobacco: Never Used  Substance and Sexual Activity  . Alcohol use: No    Alcohol/week: 0.0 standard drinks  . Drug use: No  . Sexual activity: Not on file  Other Topics Concern  . Not on file  Social History Narrative   Lives alone in single family home. Sister provides support and come to visits. Pt drives and indep ADLs.    Social Determinants of Health   Financial Resource Strain:   . Difficulty of Paying Living Expenses: Not on file  Food Insecurity:   . Worried About Charity fundraiser in the Last Year: Not on file  . Ran Out of Food in the Last Year: Not on file  Transportation Needs:   . Lack of Transportation (Medical): Not on file  . Lack of Transportation (Non-Medical): Not on file  Physical Activity:   . Days of Exercise per Week: Not on file  . Minutes of Exercise per Session: Not on file  Stress:   . Feeling of Stress : Not on file  Social Connections:   . Frequency of Communication with Friends and Family: Not on file  . Frequency of Social Gatherings with Friends and  Family: Not on file  . Attends Religious Services: Not on file  . Active Member of Clubs or Organizations: Not on file  . Attends Archivist Meetings: Not on file  . Marital Status: Not on file  Intimate Partner Violence:   . Fear of Current or Ex-Partner: Not on file  . Emotionally Abused: Not on file  . Physically Abused: Not on file  . Sexually Abused: Not on file  sister [name] 56 yo B Johnny 34 yo.  Past Medical History, Surgical history, Social history, and Family history were reviewed and updated as appropriate.   Please see review of systems for further details on the patient's review from today.   Objective:   Physical Exam:  There were no vitals taken for this visit.  Physical Exam Constitutional:      General: She is not in acute distress.    Appearance: She is well-developed.  Musculoskeletal:        General: No deformity.  Neurological:     Mental Status: She is alert and oriented to person, place, and time.     Motor: No tremor.     Coordination: Coordination normal.     Gait: Gait abnormal.     Comments: Stereotypic compulsive style of walking with some retracing steps.  Psychiatric:        Attention and Perception: Attention normal. She is attentive. She perceives auditory hallucinations. She does not perceive visual hallucinations.        Mood and Affect: Mood is anxious. Mood is not depressed. Affect is flat and inappropriate. Affect is not labile, blunt or angry.        Speech: Speech is not rapid and pressured, delayed or slurred.        Behavior: Behavior normal. Behavior is not agitated.  Thought Content: Thought content is paranoid. Thought content does not include homicidal or suicidal ideation. Thought content does not include homicidal or suicidal plan.        Cognition and Memory: Cognition normal. She exhibits impaired recent memory.     Comments: Odd facial grimacing with intrusive thoughts.  Chronic stereotypic and repetitive  gestures especially when walking. Chronically anxious and very poor social skills but not uncooperative. Rigid. Chronic severe TR psychosis. Poor insight and judgment fair. Obsesses on weight.  She's gradually lost weight.  Need to observe for developing eating disorder.  Affect chronically distressed. Stereotypic movments.    Brought picture frames she dressed up with crafts to show.  Lab Review:     Component Value Date/Time   NA 141 03/12/2019 1455   K 4.2 03/12/2019 1455   CL 103 03/12/2019 1455   CO2 31 03/12/2019 1455   GLUCOSE 105 (H) 03/12/2019 1455   BUN 20 03/12/2019 1455   CREATININE 0.86 03/12/2019 1455   CALCIUM 9.4 03/12/2019 1455   PROT 6.4 03/12/2019 1455   ALBUMIN 4.4 03/12/2019 1455   AST 18 03/12/2019 1455   ALT 17 03/12/2019 1455   ALKPHOS 98 03/12/2019 1455   BILITOT 0.3 03/12/2019 1455       Component Value Date/Time   WBC 5.5 03/12/2019 1455   RBC 4.47 03/12/2019 1455   HGB 13.0 03/12/2019 1455   HGB 12.5 11/21/2018 1031   HCT 39.2 03/12/2019 1455   HCT 37.8 11/21/2018 1031   PLT 195.0 03/12/2019 1455   PLT 201 11/21/2018 1031   MCV 87.7 03/12/2019 1455   MCV 87 11/21/2018 1031   MCH 28.7 11/21/2018 1031   MCHC 33.2 03/12/2019 1455   RDW 13.5 03/12/2019 1455   RDW 13.3 11/21/2018 1031   LYMPHSABS 1.2 11/21/2018 1031   MONOABS 0.3 08/11/2014 1243   EOSABS 0.0 11/21/2018 1031   BASOSABS 0.0 11/21/2018 1031   ANC count has been stable. No results found for: POCLITH, LITHIUM   No results found for: PHENYTOIN, PHENOBARB, VALPROATE, CBMZ   .res Assessment: Plan:    There are no diagnoses linked to this encounter.  History of long-term treatment with high-risk medication   severe treatment resistant psychosis  Greater than 50% of 30 min face to face time with patient was spent on counseling and coordination of care. We discussed her Chronic severe schizophrenic symptoms of paranoia, AH and negative symptoms.  She has not improved nor  changed significantly since the last visit nor significantly since when she was first seen in a number of years ago.. Not able to function or self-care without the help of her sister.  Have adjusted the clozapine levels up and down without much difference in her overall symptoms.She is currently taking clozapine 400 mg daily as she did not improve at the higher dosages.   Chronically easily overwhelmed.    She has chronic constipation and it flared up lately..  ANC has been stable for clozapine RX.  Disc risk of aplastic anemia. Continue monthly CBC.Disc compliance with her medications.  She has been consistent.  We discussed the short-term risks associated with benzodiazepines including sedation and increased fall risk among others.  Discussed long-term side effect risk including dependence, potential withdrawal symptoms, and the potential eventual dose-related risk of dementia. She appears to benefit from the librium.  No med changes are likely to help.   Don't lose any more weight.  Pt starting to look malnourished.  Needs to eat emphasized.  CBT around dealing with voices and telling herself that it is the illness causing the voices and sometimes that helps her.  She was more open about the intrusive thoughts and voices today than she usually is.  Benefit from support noted.  Needs a lot of support and consistency bc of her anxiety and obsessions.  She seeks reassurance.  She feels chronically fragile.  She has no social support except for her family.  Disc ways to deal with condemning voices.  Consider fluvoxamine trial again for obsessive anxiety.  Will need to watch DDI with clozapine.  Defer.  OK to stop magnesium tablets but will need to take Miralax daily to prevent constipation.   GI docs now managing her bowel issues.  This appt was 30 mins.  herefore FU every 10 weeks  Lynder Parents, MD, DFAPA   Please see After Visit Summary for patient specific instructions.  No  future appointments.  No orders of the defined types were placed in this encounter.     -------------------------------

## 2019-05-09 ENCOUNTER — Encounter: Payer: Self-pay | Admitting: Psychiatry

## 2019-05-13 ENCOUNTER — Telehealth: Payer: Self-pay

## 2019-05-13 NOTE — Telephone Encounter (Signed)
Pt sister [name]) calling again to discuss below

## 2019-05-13 NOTE — Telephone Encounter (Signed)
Pt's sister called back and would like to discuss constipation.

## 2019-05-13 NOTE — Telephone Encounter (Signed)
Called patient in response to My Chart message, reference being constipated. Patient said she did not send [place] a message that it was probably her sister [name]) @ [phone] and asked me to call her. I did and left a message to please call us back if she would like.

## 2019-05-13 NOTE — Telephone Encounter (Signed)
Called [name] (pattient's sister) back again, left message to please call back

## 2019-05-17 ENCOUNTER — Telehealth: Payer: Self-pay

## 2019-05-17 NOTE — Telephone Encounter (Signed)
Called sister [name]) and she states the patient hasn't had a BM since Tues. She is taking LInzess once daily and MIralax BID. I moved her office visit up to 05/23/19 with Alonza Bogus PA. Asked her to have the patient take Miralax 3 times a day until she gets relief.

## 2019-05-23 ENCOUNTER — Ambulatory Visit: Payer: Medicare Other | Admitting: Gastroenterology

## 2019-05-29 ENCOUNTER — Ambulatory Visit: Payer: Medicare Other | Admitting: Physician Assistant

## 2019-05-29 ENCOUNTER — Ambulatory Visit (INDEPENDENT_AMBULATORY_CARE_PROVIDER_SITE_OTHER): Payer: Medicare Other | Admitting: Physician Assistant

## 2019-05-29 ENCOUNTER — Encounter: Payer: Self-pay | Admitting: Physician Assistant

## 2019-05-29 VITALS — BP 106/60 | HR 107 | Temp 96.5°F | Ht 61.0 in | Wt 118.0 lb

## 2019-05-29 DIAGNOSIS — K5900 Constipation, unspecified: Secondary | ICD-10-CM

## 2019-05-29 NOTE — Patient Instructions (Signed)
If you are age 67 or older, your body mass index should be between 23-30. Your Body mass index is 22.3 kg/m. If this is out of the aforementioned range listed, please consider follow up with your Primary Care Provider.  If you are age 81 or younger, your body mass index should be between 19-25. Your Body mass index is 22.3 kg/m. If this is out of the aformentioned range listed, please consider follow up with your Primary Care Provider.

## 2019-05-29 NOTE — Progress Notes (Signed)
Chief Complaint: Follow-up constipation  HPI:    Nancy Brooks is a 69 year old Caucasian female, known to Dr. Havery Moros, with a past medical history as listed below including schizophrenia, who presents to clinic today accompanied by her sister who does help take care of her for follow-up of her constipation.    10/08/2015 colonoscopy with nonthrombosed external hemorrhoids, 1 8 mm polyp in the ascending colon, 1 6 mm polyp in the transverse colon, diverticulosis and nonbleeding internal hemorrhoids.  Non-thrombosed external hemorrhoids found on perianal exam.  Pathology revealed sessile serrated polyps and repeat was recommended in 5 years.     04/11/2019 patient seen in office by me and discussed constipation.  Started on Linzess 72 mcg daily.     Today, the patient sister explains that the first week in January was very hard for her explained she did not have a bowel movement for 3 or 4 days, at that time they were using the MiraLAX once a day and increased it to twice a day after calling our office.  Since doing that the patient started having more regular bowel movements and they have actually backed off now to once daily dosing of MiraLAX in addition to Linzess 72 mcg daily.  Sister tells me occasionally she will go through spells where her constipation is worse.  Asked questions about using magnesium citrate and fiber which they are not using currently.    Denies fever, chills, abdominal pain, weight loss, nausea, vomiting or symptoms that awaken her from sleep.  Past Medical History:  Diagnosis Date  . Anxiety   . BURSITIS, RIGHT KNEE   . Depression   . Dyskinesia of esophagus   . GAIT DISTURBANCE   . GERD   . HEMORRHOIDS, NOS   . HYPERLIPIDEMIA   . MENOPAUSAL SYNDROME   . Osteoporosis dx 03/2011   DEXA at gyn, started boniva in additon to Ca+D, changed to [place] 09/2012  . SCHIZOPHRENIA   . SYMPTOM, INCONTINENCE, URGE     Past Surgical History:  Procedure Laterality Date  .  BREAST SURGERY  05/09/1998   left breast biopsy  . COLONOSCOPY  03-16-2010    Current Outpatient Medications  Medication Sig Dispense Refill  . aspirin 81 MG tablet Take 81 mg by mouth daily.      . calcium elemental as carbonate (TUMS ULTRA 1000) 400 MG tablet Chew 1,000 mg by mouth daily.      . chlordiazePOXIDE (LIBRIUM) 10 MG capsule Take 1 capsule (10 mg total) by mouth 2 (two) times daily. 60 capsule 5  . Cholecalciferol (VITAMIN D3) 1000 UNITS CAPS Take by mouth daily.      . cloZAPine (CLOZARIL) 100 MG tablet 1 in am and 4 at night (Patient taking differently: Take 500 mg by mouth at bedtime. 1 in am and 4 at night) 150 tablet 11  . famotidine (PEPCID) 10 MG tablet Take 10 mg by mouth daily.     [name] ibandronate (BONIVA) 150 MG tablet SEE NOTES 3 tablet 3  . linaclotide (LINZESS) 72 MCG capsule Take 1 capsule (72 mcg total) by mouth daily. 30 capsule 3  . polyethylene glycol (MIRALAX / GLYCOLAX) 17 g packet Take 17 g by mouth daily as needed.    . simvastatin (ZOCOR) 40 MG tablet Take 1 tablet (40 mg total) by mouth daily at 6 PM. 90 tablet 3  . vitamin B-12 (CYANOCOBALAMIN) 1000 MCG tablet Take 1 tablet (1,000 mcg total) by mouth daily. 90 tablet 3   No  current facility-administered medications for this visit.    Allergies as of 05/29/2019  . (No Known Allergies)    Family History  Problem Relation Age of Onset  . Hypertension Mother   . Depression Mother   . Prostate cancer Father   . Colon cancer Brother 48  . Esophageal cancer Neg Hx   . Pancreatic cancer Neg Hx   . Rectal cancer Neg Hx   . Stomach cancer Neg Hx     Social History   Socioeconomic History  . Marital status: Single    Spouse name: Not on file  . Number of children: Not on file  . Years of education: Not on file  . Highest education level: Not on file  Occupational History  . Not on file  Tobacco Use  . Smoking status: Never Smoker  . Smokeless tobacco: Never Used  Substance and Sexual Activity   . Alcohol use: No    Alcohol/week: 0.0 standard drinks  . Drug use: No  . Sexual activity: Not on file  Other Topics Concern  . Not on file  Social History Narrative   Lives alone in single family home. Sister provides support and come to visits. Pt drives and indep ADLs.    Social Determinants of Health   Financial Resource Strain:   . Difficulty of Paying Living Expenses: Not on file  Food Insecurity:   . Worried About Charity fundraiser in the Last Year: Not on file  . Ran Out of Food in the Last Year: Not on file  Transportation Needs:   . Lack of Transportation (Medical): Not on file  . Lack of Transportation (Non-Medical): Not on file  Physical Activity:   . Days of Exercise per Week: Not on file  . Minutes of Exercise per Session: Not on file  Stress:   . Feeling of Stress : Not on file  Social Connections:   . Frequency of Communication with Friends and Family: Not on file  . Frequency of Social Gatherings with Friends and Family: Not on file  . Attends Religious Services: Not on file  . Active Member of Clubs or Organizations: Not on file  . Attends Archivist Meetings: Not on file  . Marital Status: Not on file  Intimate Partner Violence:   . Fear of Current or Ex-Partner: Not on file  . Emotionally Abused: Not on file  . Physically Abused: Not on file  . Sexually Abused: Not on file    Review of Systems:    Constitutional: No weight loss, fever or chills Cardiovascular: No chest pain Respiratory: No SOB Gastrointestinal: See HPI and otherwise negative   Physical Exam:  Vital signs: BP 106/60   Pulse (!) 107   Temp (!) 96.5 F (35.8 C)   Ht 5\' 1"  (1.549 m)   Wt 118 lb (53.5 kg)   BMI 22.30 kg/m   Constitutional:   Pleasant Caucasian female appears to be in NAD, Well developed, Well nourished, alert and cooperative Respiratory: Respirations even and unlabored. Lungs clear to auscultation bilaterally.   No wheezes, crackles, or rhonchi.   Cardiovascular: Normal S1, S2. No MRG. Regular rate and rhythm. No peripheral edema, cyanosis or pallor.  Gastrointestinal:  Soft, nondistended, nontender. No rebound or guarding. Normal bowel sounds. No appreciable masses or hepatomegaly. Rectal:  Not performed.  Psychiatric: Demonstrates good judgement and reason without abnormal affect or behaviors.  RELEVANT LABS AND IMAGING: CBC    Component Value Date/Time   WBC 5.5  03/12/2019 1455   RBC 4.47 03/12/2019 1455   HGB 13.0 03/12/2019 1455   HGB 12.5 11/21/2018 1031   HCT 39.2 03/12/2019 1455   HCT 37.8 11/21/2018 1031   PLT 195.0 03/12/2019 1455   PLT 201 11/21/2018 1031   MCV 87.7 03/12/2019 1455   MCV 87 11/21/2018 1031   MCH 28.7 11/21/2018 1031   MCHC 33.2 03/12/2019 1455   RDW 13.5 03/12/2019 1455   RDW 13.3 11/21/2018 1031   LYMPHSABS 1.2 11/21/2018 1031   MONOABS 0.3 08/11/2014 1243   EOSABS 0.0 11/21/2018 1031   BASOSABS 0.0 11/21/2018 1031    CMP     Component Value Date/Time   NA 141 03/12/2019 1455   K 4.2 03/12/2019 1455   CL 103 03/12/2019 1455   CO2 31 03/12/2019 1455   GLUCOSE 105 (H) 03/12/2019 1455   BUN 20 03/12/2019 1455   CREATININE 0.86 03/12/2019 1455   CALCIUM 9.4 03/12/2019 1455   PROT 6.4 03/12/2019 1455   ALBUMIN 4.4 03/12/2019 1455   AST 18 03/12/2019 1455   ALT 17 03/12/2019 1455   ALKPHOS 98 03/12/2019 1455   BILITOT 0.3 03/12/2019 1455    Assessment: 1.  Constipation: Controlled with Linzess 72 mcg daily and MiraLAX daily 2.  History of colon polyps: Patient's next colonoscopy will be due in 2022 for a finding of sessile serrated polyps  Plan: 1.  Discussed with the patient and her sister today that they can titrate MiraLAX to normal bowel movements.  Discussed that they can use this up to 4 times a day if necessary.  It is perfectly okay for them to increase this as needed during times of constipation.  In fact, I would recommend that we continue doing this rather than increase  the Linzess as it seems as though typically patient does fairly well with the Linzess 72 mcg daily. 2.  Patient's next colonoscopy is due in June 2022 3.  Patient to follow in clinic with [place] as needed before then  Ellouise Newer, PA-C [facility] 05/29/2019, 10:19 AM  Cc: Hoyt Koch, *

## 2019-06-06 ENCOUNTER — Encounter: Payer: Self-pay | Admitting: Psychiatry

## 2019-06-07 ENCOUNTER — Ambulatory Visit: Payer: Medicare Other

## 2019-06-13 ENCOUNTER — Ambulatory Visit: Payer: Medicare Other

## 2019-06-18 ENCOUNTER — Telehealth: Payer: Self-pay | Admitting: Psychiatry

## 2019-06-18 ENCOUNTER — Ambulatory Visit: Payer: Medicare Other

## 2019-06-18 NOTE — Telephone Encounter (Signed)
Pt requesting refill for Librium 10 mg. Has 5 refills. Pharmacy telling her she does not have refill. Only has 2 doses left. Please advise next appt 2/24 [facility]

## 2019-06-19 NOTE — Telephone Encounter (Signed)
Contacted her pharmacy and they did have the Rx submitted on 04/24/2019 with 5 refills, they will fill it for her now.

## 2019-07-03 ENCOUNTER — Other Ambulatory Visit: Payer: Self-pay

## 2019-07-03 ENCOUNTER — Encounter: Payer: Self-pay | Admitting: Psychiatry

## 2019-07-03 ENCOUNTER — Ambulatory Visit (INDEPENDENT_AMBULATORY_CARE_PROVIDER_SITE_OTHER): Payer: Medicare Other | Admitting: Psychiatry

## 2019-07-03 DIAGNOSIS — K5903 Drug induced constipation: Secondary | ICD-10-CM

## 2019-07-03 DIAGNOSIS — F200 Paranoid schizophrenia: Secondary | ICD-10-CM

## 2019-07-03 DIAGNOSIS — Z79899 Other long term (current) drug therapy: Secondary | ICD-10-CM | POA: Diagnosis not present

## 2019-07-03 DIAGNOSIS — F411 Generalized anxiety disorder: Secondary | ICD-10-CM | POA: Diagnosis not present
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
## 2019-07-04 ENCOUNTER — Encounter: Payer: Self-pay | Admitting: Psychiatry

## 2019-07-31 ENCOUNTER — Other Ambulatory Visit: Payer: Self-pay | Admitting: Psychiatry

## 2019-07-31 DIAGNOSIS — Z79899 Other long term (current) drug therapy: Secondary | ICD-10-CM

## 2019-07-31 DIAGNOSIS — F200 Paranoid schizophrenia: Secondary | ICD-10-CM

## 2019-08-02 LAB — CBC WITH DIFFERENTIAL/PLATELET
Basophils Absolute: 0 10*3/uL (ref 0.0–0.2)
Basos: 0 %
EOS (ABSOLUTE): 0 10*3/uL (ref 0.0–0.4)
Eos: 0 %
Hematocrit: 38.2 % (ref 34.0–46.6)
Hemoglobin: 12.4 g/dL (ref 11.1–15.9)
Immature Grans (Abs): 0 10*3/uL (ref 0.0–0.1)
Immature Granulocytes: 0 %
Lymphocytes Absolute: 1.3 10*3/uL (ref 0.7–3.1)
Lymphs: 18 %
MCH: 28.6 pg (ref 26.6–33.0)
MCHC: 32.5 g/dL (ref 31.5–35.7)
MCV: 88 fL (ref 79–97)
Monocytes Absolute: 0.5 10*3/uL (ref 0.1–0.9)
Monocytes: 6 %
Neutrophils Absolute: 5.8 10*3/uL (ref 1.4–7.0)
Neutrophils: 76 %
Platelets: 226 10*3/uL (ref 150–450)
RBC: 4.34 x10E6/uL (ref 3.77–5.28)
RDW: 13.1 % (ref 11.7–15.4)
WBC: 7.6 10*3/uL (ref 3.4–10.8)

## 2019-08-06 ENCOUNTER — Other Ambulatory Visit: Payer: Self-pay | Admitting: Psychiatry

## 2019-08-29 ENCOUNTER — Other Ambulatory Visit: Payer: Self-pay | Admitting: Psychiatry

## 2019-08-29 DIAGNOSIS — F200 Paranoid schizophrenia: Secondary | ICD-10-CM

## 2019-08-29 DIAGNOSIS — Z79899 Other long term (current) drug therapy: Secondary | ICD-10-CM

## 2019-09-11 ENCOUNTER — Other Ambulatory Visit: Payer: Self-pay

## 2019-09-11 ENCOUNTER — Encounter: Payer: Self-pay | Admitting: Psychiatry

## 2019-09-11 ENCOUNTER — Ambulatory Visit (INDEPENDENT_AMBULATORY_CARE_PROVIDER_SITE_OTHER): Payer: Medicare Other | Admitting: Psychiatry

## 2019-09-11 DIAGNOSIS — F411 Generalized anxiety disorder: Secondary | ICD-10-CM

## 2019-09-11 DIAGNOSIS — F200 Paranoid schizophrenia: Secondary | ICD-10-CM

## 2019-09-11 DIAGNOSIS — Z79899 Other long term (current) drug therapy: Secondary | ICD-10-CM

## 2019-09-11 DIAGNOSIS — K5903 Drug induced constipation: Secondary | ICD-10-CM | POA: Diagnosis not present

## 2019-09-11 MED ORDER — CHLORDIAZEPOXIDE HCL 10 MG PO CAPS: 10.0000 mg | ORAL_CAPSULE | Freq: Two times a day (BID) | ORAL | 5 refills | Status: DC
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
## 2019-09-23 ENCOUNTER — Other Ambulatory Visit: Payer: Self-pay | Admitting: *Deleted

## 2019-09-23 MED ORDER — LINACLOTIDE 72 MCG PO CAPS: 72.0000 ug | ORAL_CAPSULE | Freq: Every day | ORAL | 3 refills | Status: DC

## 2019-10-07 ENCOUNTER — Other Ambulatory Visit: Payer: Self-pay | Admitting: Internal Medicine

## 2019-10-23 ENCOUNTER — Other Ambulatory Visit: Payer: Self-pay | Admitting: Psychiatry

## 2019-10-24 ENCOUNTER — Encounter: Payer: Self-pay | Admitting: Psychiatry

## 2019-10-24 LAB — CBC WITH DIFFERENTIAL/PLATELET
Basophils Absolute: 0 10*3/uL (ref 0.0–0.2)
Basos: 0 %
EOS (ABSOLUTE): 0 10*3/uL (ref 0.0–0.4)
Eos: 0 %
Hematocrit: 38.8 % (ref 34.0–46.6)
Hemoglobin: 12.8 g/dL (ref 11.1–15.9)
Immature Grans (Abs): 0 10*3/uL (ref 0.0–0.1)
Immature Granulocytes: 0 %
Lymphocytes Absolute: 1.2 10*3/uL (ref 0.7–3.1)
Lymphs: 18 %
MCH: 28.7 pg (ref 26.6–33.0)
MCHC: 33 g/dL (ref 31.5–35.7)
MCV: 87 fL (ref 79–97)
Monocytes Absolute: 0.5 10*3/uL (ref 0.1–0.9)
Monocytes: 7 %
Neutrophils Absolute: 4.9 10*3/uL (ref 1.4–7.0)
Neutrophils: 75 %
Platelets: 223 10*3/uL (ref 150–450)
RBC: 4.46 x10E6/uL (ref 3.77–5.28)
RDW: 14.3 % (ref 11.7–15.4)
WBC: 6.6 10*3/uL (ref 3.4–10.8)

## 2019-11-20 ENCOUNTER — Other Ambulatory Visit: Payer: Self-pay | Admitting: Psychiatry

## 2019-11-21 ENCOUNTER — Encounter: Payer: Self-pay | Admitting: Psychiatry

## 2019-11-21 LAB — CBC WITH DIFFERENTIAL/PLATELET
Basophils Absolute: 0 10*3/uL (ref 0.0–0.2)
Basos: 0 %
EOS (ABSOLUTE): 0 10*3/uL (ref 0.0–0.4)
Eos: 0 %
Hematocrit: 38.2 % (ref 34.0–46.6)
Hemoglobin: 12.8 g/dL (ref 11.1–15.9)
Immature Grans (Abs): 0 10*3/uL (ref 0.0–0.1)
Immature Granulocytes: 0 %
Lymphocytes Absolute: 1.2 10*3/uL (ref 0.7–3.1)
Lymphs: 19 %
MCH: 28.1 pg (ref 26.6–33.0)
MCHC: 33.5 g/dL (ref 31.5–35.7)
MCV: 84 fL (ref 79–97)
Monocytes Absolute: 0.4 10*3/uL (ref 0.1–0.9)
Monocytes: 7 %
Neutrophils Absolute: 4.6 10*3/uL (ref 1.4–7.0)
Neutrophils: 74 %
Platelets: 212 10*3/uL (ref 150–450)
RBC: 4.56 x10E6/uL (ref 3.77–5.28)
RDW: 14 % (ref 11.7–15.4)
WBC: 6.2 10*3/uL (ref 3.4–10.8)

## 2019-11-26 ENCOUNTER — Other Ambulatory Visit: Payer: Self-pay

## 2019-11-26 ENCOUNTER — Encounter: Payer: Self-pay | Admitting: Psychiatry

## 2019-11-26 ENCOUNTER — Ambulatory Visit (INDEPENDENT_AMBULATORY_CARE_PROVIDER_SITE_OTHER): Payer: Medicare Other | Admitting: Psychiatry

## 2019-11-26 DIAGNOSIS — K5903 Drug induced constipation: Secondary | ICD-10-CM | POA: Diagnosis not present

## 2019-11-26 DIAGNOSIS — Z79899 Other long term (current) drug therapy: Secondary | ICD-10-CM | POA: Diagnosis not present

## 2019-11-26 DIAGNOSIS — F200 Paranoid schizophrenia: Secondary | ICD-10-CM

## 2019-11-26 DIAGNOSIS — F411 Generalized anxiety disorder: Secondary | ICD-10-CM | POA: Diagnosis not present

## 2019-11-26 NOTE — Patient Instructions (Addendum)
talk with primary care doctor about worsening constipation and diarrhea. No med changes today.

## 2019-11-26 NOTE — Progress Notes (Signed)
[name] [id number] Feb 19, 1951 69 y.o.  Subjective:   Patient ID:  Nancy Brooks is a 69 y.o. (DOB 03-10-1951) female.  Chief Complaint:  Chief Complaint  Patient presents with  . Follow-up  . Anxiety  . Schizophrenia  . Paranoid    HPI  Nancy Brooks presentse today for follow-up of schizophrenia with severe negative symptoms.  09/11/2019 the following is noted: Had problems with standing order for unclear reason.  Tried to address that problem. Brought pictures she'd drawin in HS. Still experiences noises in the house that frighten her.  Back to [employer] and [employer]. No meds were changed.  11/26/2019 appointment with the following noted: Sx continue as noted before.  Still doing crafts and brought examples.  Enjoys this. Still has repetitive motion rituals and touching rituals.   More constipation and diarrhea lately. Nancy Brooks wanted her to mention more trouble going to sleep.  Probably 6-7 hours of sleep. Checks weight once daily.   New decision to maintain weight at 110#.  Pt reports that mood is Anxious and describes anxiety as Moderate. Anxiety symptoms include: excessive and obsessive worry.  Paranoia. . Chronic intrusive dysphoric thoughts usually of sexual nature or that she's going to hell.  Pt reports some sleep issues. Pt reports that appetite is good. Pt reports that energy is no change and good. Concentration is good. Suicidal thoughts:  denied by patient.  Does not have a lot of interests chronically.  Anxious around people.  Chronic AH makes her anxious.   Worries about "uncleanness".  Not unusually anxious at home.  Has worries today she's afraid to discuss over the phone.   Does her own grocery shopping.  Sister helps with bills and appts.. Talks to her daily.   Little other social contact.   Still goes to [employer] on Sundays.  B will call and do chores at times.  Admits she lies to herself at times.  Going to [employer] and [employer] more and [employer] 3  times weekly which Nancy Brooks directs. Had flat tire and didn't recognize it initially but then got help with it.  Car is 67 years old and will get another.  GI doctor, Nancy Newer, PA addressed bowel problems.  Taking Miralax and Linzess.  Compliant and no med problems. Nancy Brooks helps her with store shopping and gas station.     Multiple prior psych hospitalizations before starting clozapine.  Review of Systems:  Review of Systems  Constitutional: Negative for appetite change and unexpected weight change.  Gastrointestinal: Positive for constipation and diarrhea.  Musculoskeletal: Positive for arthralgias.       Foot pain and plans to see a doctor  Neurological: Negative for dizziness, tremors, weakness and headaches.  Psychiatric/Behavioral: Positive for behavioral problems, confusion, decreased concentration and hallucinations. Negative for agitation, dysphoric mood, self-injury, sleep disturbance and suicidal ideas. The patient is nervous/anxious. The patient is not hyperactive.     Medications: I have reviewed the patient's current medications.  Current Outpatient Medications  Medication Sig Dispense Refill  . aspirin 81 MG tablet Take 81 mg by mouth daily.      . calcium elemental as carbonate (TUMS ULTRA 1000) 400 MG tablet Chew 1,000 mg by mouth daily.      . chlordiazePOXIDE (LIBRIUM) 10 MG capsule Take 1 capsule (10 mg total) by mouth 2 (two) times daily. 60 capsule 5  . Cholecalciferol (VITAMIN D3) 1000 UNITS CAPS Take by mouth daily.      . cloZAPine (CLOZARIL) 100  MG tablet TAKE 1 TABLET BY MOUTH EVERY MORNING AND 4 TABLETS EVERY EVENING 150 tablet 11  . famotidine (PEPCID) 10 MG tablet Take 10 mg by mouth daily.     [name] ibandronate (BONIVA) 150 MG tablet SEE NOTES 3 tablet 3  . linaclotide (LINZESS) 72 MCG capsule Take 1 capsule (72 mcg total) by mouth daily. 30 capsule 3  . polyethylene glycol (MIRALAX / GLYCOLAX) 17 g packet Take 17 g by mouth daily as needed.    .  simvastatin (ZOCOR) 40 MG tablet TAKE 1 TABLET(40 MG) BY MOUTH DAILY AT 6 PM 90 tablet 3  . vitamin B-12 (CYANOCOBALAMIN) 1000 MCG tablet Take 1 tablet (1,000 mcg total) by mouth daily. 90 tablet 3   No current facility-administered medications for this visit.    Medication Side Effects: None.  Unless GI.  Allergies: No Known Allergies  Past Medical History:  Diagnosis Date  . Anxiety   . BURSITIS, RIGHT KNEE   . Depression   . Dyskinesia of esophagus   . GAIT DISTURBANCE   . GERD   . HEMORRHOIDS, NOS   . HYPERLIPIDEMIA   . MENOPAUSAL SYNDROME   . Osteoporosis dx 03/2011   DEXA at gyn, started boniva in additon to Ca+D, changed to [place] 09/2012  . SCHIZOPHRENIA   . SYMPTOM, INCONTINENCE, URGE     Family History  Problem Relation Age of Onset  . Hypertension Mother   . Depression Mother   . Prostate cancer Father   . Colon cancer Brother 38  . Esophageal cancer Neg Hx   . Pancreatic cancer Neg Hx   . Rectal cancer Neg Hx   . Stomach cancer Neg Hx     Social History   Socioeconomic History  . Marital status: Single    Spouse name: Not on file  . Number of children: Not on file  . Years of education: Not on file  . Highest education level: Not on file  Occupational History  . Not on file  Tobacco Use  . Smoking status: Never Smoker  . Smokeless tobacco: Never Used  Substance and Sexual Activity  . Alcohol use: No    Alcohol/week: 0.0 standard drinks  . Drug use: No  . Sexual activity: Not on file  Other Topics Concern  . Not on file  Social History Narrative   Lives alone in single family home. Sister provides support and come to visits. Pt drives and indep ADLs.    Social Determinants of Health   Financial Resource Strain:   . Difficulty of Paying Living Expenses:   Food Insecurity:   . Worried About Charity fundraiser in the Last Year:   . Arboriculturist in the Last Year:   Transportation Needs:   . Film/video editor (Medical):   [name] Lack of  Transportation (Non-Medical):   Physical Activity:   . Days of Exercise per Week:   . Minutes of Exercise per Session:   Stress:   . Feeling of Stress :   Social Connections:   . Frequency of Communication with Friends and Family:   . Frequency of Social Gatherings with Friends and Family:   . Attends Religious Services:   . Active Member of Clubs or Organizations:   . Attends Archivist Meetings:   [name] Marital Status:   Intimate Partner Violence:   . Fear of Current or Ex-Partner:   . Emotionally Abused:   [name] Physically Abused:   . Sexually Abused:  sister Nancy Brooks 33 yo B Johnny 63 yo.  Past Medical History, Surgical history, Social history, and Family history were reviewed and updated as appropriate.   Please see review of systems for further details on the patient's review from today.   Objective:   Physical Exam:  There were no vitals taken for this visit.  Physical Exam Constitutional:      General: She is not in acute distress.    Appearance: She is well-developed.  Musculoskeletal:        General: No deformity.  Neurological:     Mental Status: She is alert and oriented to person, place, and time.     Motor: Tremor present.     Coordination: Coordination normal.     Gait: Gait abnormal.     Comments: Stereotypic compulsive style of walking with some retracing steps.  Psychiatric:        Attention and Perception: Attention normal. She is attentive. She perceives auditory hallucinations. She does not perceive visual hallucinations.        Mood and Affect: Mood is anxious. Mood is not depressed. Affect is flat and inappropriate. Affect is not labile, blunt or angry.        Speech: Speech is not rapid and pressured, delayed or slurred.        Behavior: Behavior normal. Behavior is not agitated.        Thought Content: Thought content is paranoid. Thought content does not include homicidal or suicidal ideation. Thought content does not include homicidal or  suicidal plan.        Cognition and Memory: Cognition is impaired. She exhibits impaired recent memory.     Comments: Odd facial grimacing with intrusive thoughts.  Chronic stereotypic and repetitive gestures especially when walking. Chronically anxious and very poor social skills but not uncooperative. Rigid. 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 Poor insight and judgment fair. Obsesses on weight.    Affect chronically distressed. Stereotypic movments. Occ AH and VH  Reduced concentration.    Brought picture frames she dressed up with crafts to show.again   Lab Review:     Component Value Date/Time   NA 141 03/12/2019 1455   K 4.2 03/12/2019 1455   CL 103 03/12/2019 1455   CO2 31 03/12/2019 1455   GLUCOSE 105 (H) 03/12/2019 1455   BUN 20 03/12/2019 1455   CREATININE 0.86 03/12/2019 1455   CALCIUM 9.4 03/12/2019 1455   PROT 6.4 03/12/2019 1455   ALBUMIN 4.4 03/12/2019 1455   AST 18 03/12/2019 1455   ALT 17 03/12/2019 1455   ALKPHOS 98 03/12/2019 1455   BILITOT 0.3 03/12/2019 1455       Component Value Date/Time   WBC 6.2 11/20/2019 1104   WBC 5.5 03/12/2019 1455   RBC 4.56 11/20/2019 1104   RBC 4.47 03/12/2019 1455   HGB 12.8 11/20/2019 1104   HCT 38.2 11/20/2019 1104   PLT 212 11/20/2019 1104   MCV 84 11/20/2019 1104   MCH 28.1 11/20/2019 1104   MCHC 33.5 11/20/2019 1104   MCHC 33.2 03/12/2019 1455   RDW 14.0 11/20/2019 1104   LYMPHSABS 1.2 11/20/2019 1104   MONOABS 0.3 08/11/2014 1243   EOSABS 0.0 11/20/2019 1104   BASOSABS 0.0 11/20/2019 1104   ANC count has been stable. No results found for: POCLITH, LITHIUM   No results found for: PHENYTOIN, PHENOBARB, VALPROATE, CBMZ   .res Assessment: Plan:    Nancy Brooks was seen today for follow-up, anxiety, schizophrenia and paranoid.  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  Greater than 50% of 30 min face to face time with patient was spent on counseling and coordination of care. We discussed her Chronic severe schizophrenic symptoms of paranoia, AH and negative symptoms.  She has not improved nor changed significantly since the last visit nor significantly since when she was first seen in a number of years ago.. Not able to function or self-care without the help of her sister.  Have adjusted the clozapine levels up and down without much difference in her overall symptoms.  She is currently taking clozapine 500 mg daily as she did not improve at the higher dosages.   Chronically easily overwhelmed.   Needs help from sister. Disc dx again today bc she was uncertain about what a psychiatrist does.  She has chronic constipation and it flared up lately.Marland KitchenAnswered questions about Linzess.  Still takes Miralax. It's possible clozapine contributes to the problem.  Disc this in depth bc she had questions.   Rec talk with PCP about worsening constipation and diarrhea.  ANC has been stable for clozapine RX.  Disc risk of aplastic anemia. Continue monthly CBC.Disc compliance with her medications.  She has been consistent. Continue clozapine 500 mg daily. Librium 10 BID.  We discussed the short-term risks associated with benzodiazepines including sedation and increased fall risk among others.  Discussed long-term side effect risk including dependence, potential withdrawal symptoms, and the potential eventual dose-related risk of dementia. She appears to benefit from the librium.   No med changes are likely to help.   CBT around dealing with voices and telling herself that it is the illness causing the voices and sometimes that helps her.  She was more open about the intrusive thoughts and voices today than she usually is.  Benefit from support  noted.  Needs a lot of support and consistency bc of her anxiety and obsessions.  She seeks reassurance.  She feels chronically fragile.  She has no social support except for her family.  Disc ways to deal with condemning voices.  Consider fluvoxamine trial again for obsessive anxiety.  Will need to watch DDI with clozapine.  Defer.  Therefore FU every 10 weeks  Lynder Parents, MD, DFAPA   Please see After Visit Summary for patient specific instructions.  No future appointments.  No orders of the defined types were placed in this encounter.     -------------------------------

## 2019-12-19 ENCOUNTER — Other Ambulatory Visit: Payer: Self-pay | Admitting: Psychiatry

## 2019-12-19 ENCOUNTER — Encounter: Payer: Self-pay | Admitting: Psychiatry

## 2019-12-19 DIAGNOSIS — Z79899 Other long term (current) drug therapy: Secondary | ICD-10-CM

## 2019-12-19 DIAGNOSIS — F200 Paranoid schizophrenia: Secondary | ICD-10-CM

## 2020-01-03 ENCOUNTER — Other Ambulatory Visit: Payer: Self-pay | Admitting: Physician Assistant

## 2020-01-17 ENCOUNTER — Encounter: Payer: Self-pay | Admitting: Psychiatry

## 2020-02-25 ENCOUNTER — Encounter: Payer: Self-pay | Admitting: Psychiatry

## 2020-02-25 ENCOUNTER — Other Ambulatory Visit: Payer: Self-pay

## 2020-02-25 ENCOUNTER — Ambulatory Visit (INDEPENDENT_AMBULATORY_CARE_PROVIDER_SITE_OTHER): Payer: Medicare Other | Admitting: Psychiatry

## 2020-02-25 DIAGNOSIS — K5903 Drug induced constipation: Secondary | ICD-10-CM

## 2020-02-25 DIAGNOSIS — F411 Generalized anxiety disorder: Secondary | ICD-10-CM

## 2020-02-25 DIAGNOSIS — Z79899 Other long term (current) drug therapy: Secondary | ICD-10-CM | POA: Diagnosis not present

## 2020-02-25 DIAGNOSIS — F200 Paranoid schizophrenia: Secondary | ICD-10-CM

## 2020-02-25 NOTE — Progress Notes (Signed)
[name] [id number] Aug 22, 1950 69 y.o.  Subjective:   Patient ID:  Nancy Brooks is a 69 y.o. (DOB 06-15-1950) female.  Chief Complaint:  Chief Complaint  Patient presents with  . Follow-up  . Hallucinations  . Schizophrenia  . Paranoid  . Anxiety    HPI  CAMRIN LAPRE presentse today for follow-up of schizophrenia with severe negative symptoms.  09/11/2019 the following is noted: Had problems with standing order for unclear reason.  Tried to address that problem. Brought pictures she'd drawin in HS. Still experiences noises in the house that frighten her.  Back to [employer] and [employer]. No meds were changed.  11/26/2019 appointment with the following noted: Sx continue as noted before.  Still doing crafts and brought examples.  Enjoys this. Still has repetitive motion rituals and touching rituals.   More constipation and diarrhea lately. [name] wanted her to mention more trouble going to sleep.  Probably 6-7 hours of sleep. Checks weight once daily.   New decision to maintain weight at 110#.   02/25/20 appt with the following noted: Still making crafts.  Brought some to show MD. Still has episodes of hearing people outside her house causing anxiety.  Didn't get up right away but when she did they had left.  No knocks on the door. Unusual experiences driving car but no accidents.  If has flat tire or car problem then calls [name] for help. Has worried she blasphemed the [facility] lately and has heard condemning weights.  Does not read much.  Watches some TV.  Pt reports that mood is Anxious and describes anxiety as Moderate. Anxiety symptoms include: excessive and obsessive worry.  Paranoia. . Chronic intrusive dysphoric thoughts usually of sexual nature or that she's going to hell.  Pt reports some sleep issues. Pt reports that appetite is good. Pt reports that energy is no change and good. Concentration is good. Suicidal thoughts:  denied by patient.  Does not  have a lot of interests chronically.  Anxious around people.  Chronic AH makes her anxious.   Worries about "uncleanness".  Not unusually anxious at home.  Has worries today she's afraid to discuss over the phone.   Does her own grocery shopping.  Sister helps with bills and appts.. Talks to her daily.   Little other social contact.   Still goes to [employer] on Sundays.  B will call and do chores at times.  Admits she lies to herself at times.  Going to [employer] and [employer] more and [employer] 3 times weekly which [name] directs. Had flat tire and didn't recognize it initially but then got help with it.  Car is 67 years old and will get another.  GI doctor, Ellouise Newer, PA addressed bowel problems.  Taking Miralax and Linzess.  Compliant and no med problems. [name] helps her with store shopping and gas station.     Multiple prior psych hospitalizations before starting clozapine.  Review of Systems:  Review of Systems  Constitutional: Negative for appetite change and unexpected weight change.  Cardiovascular: Negative for chest pain and palpitations.  Gastrointestinal: Positive for constipation and diarrhea.  Musculoskeletal: Positive for arthralgias.       Foot pain and plans to see a doctor  Neurological: Negative for dizziness, tremors, weakness and headaches.  Psychiatric/Behavioral: Positive for behavioral problems, confusion, decreased concentration and hallucinations. Negative for agitation, dysphoric mood, self-injury, sleep disturbance and suicidal ideas. The patient is nervous/anxious. The patient is not hyperactive.  Medications: I have reviewed the patient's current medications.  Current Outpatient Medications  Medication Sig Dispense Refill  . aspirin 81 MG tablet Take 81 mg by mouth daily.      . calcium elemental as carbonate (TUMS ULTRA 1000) 400 MG tablet Chew 1,000 mg by mouth daily.      . chlordiazePOXIDE (LIBRIUM) 10 MG capsule Take 1 capsule (10 mg total) by  mouth 2 (two) times daily. 60 capsule 5  . Cholecalciferol (VITAMIN D3) 1000 UNITS CAPS Take by mouth daily.      . cloZAPine (CLOZARIL) 100 MG tablet TAKE 1 TABLET BY MOUTH EVERY MORNING AND 4 TABLETS EVERY EVENING 150 tablet 11  . famotidine (PEPCID) 10 MG tablet Take 10 mg by mouth daily.     [name] ibandronate (BONIVA) 150 MG tablet SEE NOTES 3 tablet 3  . LINZESS 72 MCG capsule TAKE 1 CAPSULE(72 MCG) BY MOUTH DAILY 30 capsule 3  . polyethylene glycol (MIRALAX / GLYCOLAX) 17 g packet Take 17 g by mouth daily as needed.    . simvastatin (ZOCOR) 40 MG tablet TAKE 1 TABLET(40 MG) BY MOUTH DAILY AT 6 PM 90 tablet 3  . vitamin B-12 (CYANOCOBALAMIN) 1000 MCG tablet Take 1 tablet (1,000 mcg total) by mouth daily. 90 tablet 3   No current facility-administered medications for this visit.    Medication Side Effects: None.  Unless GI.  Allergies: No Known Allergies  Past Medical History:  Diagnosis Date  . Anxiety   . BURSITIS, RIGHT KNEE   . Depression   . Dyskinesia of esophagus   . GAIT DISTURBANCE   . GERD   . HEMORRHOIDS, NOS   . HYPERLIPIDEMIA   . MENOPAUSAL SYNDROME   . Osteoporosis dx 03/2011   DEXA at gyn, started boniva in additon to Ca+D, changed to [place] 09/2012  . SCHIZOPHRENIA   . SYMPTOM, INCONTINENCE, URGE     Family History  Problem Relation Age of Onset  . Hypertension Mother   . Depression Mother   . Prostate cancer Father   . Colon cancer Brother 79  . Esophageal cancer Neg Hx   . Pancreatic cancer Neg Hx   . Rectal cancer Neg Hx   . Stomach cancer Neg Hx     Social History   Socioeconomic History  . Marital status: Single    Spouse name: Not on file  . Number of children: Not on file  . Years of education: Not on file  . Highest education level: Not on file  Occupational History  . Not on file  Tobacco Use  . Smoking status: Never Smoker  . Smokeless tobacco: Never Used  Substance and Sexual Activity  . Alcohol use: No    Alcohol/week: 0.0  standard drinks  . Drug use: No  . Sexual activity: Not on file  Other Topics Concern  . Not on file  Social History Narrative   Lives alone in single family home. Sister provides support and come to visits. Pt drives and indep ADLs.    Social Determinants of Health   Financial Resource Strain:   . Difficulty of Paying Living Expenses: Not on file  Food Insecurity:   . Worried About Charity fundraiser in the Last Year: Not on file  . Ran Out of Food in the Last Year: Not on file  Transportation Needs:   . Lack of Transportation (Medical): Not on file  . Lack of Transportation (Non-Medical): Not on file  Physical Activity:   .  Days of Exercise per Week: Not on file  . Minutes of Exercise per Session: Not on file  Stress:   . Feeling of Stress : Not on file  Social Connections:   . Frequency of Communication with Friends and Family: Not on file  . Frequency of Social Gatherings with Friends and Family: Not on file  . Attends Religious Services: Not on file  . Active Member of Clubs or Organizations: Not on file  . Attends Archivist Meetings: Not on file  . Marital Status: Not on file  Intimate Partner Violence:   . Fear of Current or Ex-Partner: Not on file  . Emotionally Abused: Not on file  . Physically Abused: Not on file  . Sexually Abused: Not on file  sister [name] 93 yo B Johnny 61 yo.  Past Medical History, Surgical history, Social history, and Family history were reviewed and updated as appropriate.   Please see review of systems for further details on the patient's review from today.   Objective:   Physical Exam:  There were no vitals taken for this visit.  Physical Exam Constitutional:      General: She is not in acute distress.    Appearance: She is well-developed.  Musculoskeletal:        General: No deformity.  Neurological:     Mental Status: She is alert and oriented to person, place, and time.     Motor: Tremor present.      Coordination: Coordination normal.     Gait: Gait abnormal.     Comments: Stereotypic compulsive style of walking with some retracing steps.  Psychiatric:        Attention and Perception: Attention normal. She is attentive. She perceives auditory hallucinations. She does not perceive visual hallucinations.        Mood and Affect: Mood is anxious. Mood is not depressed. Affect is flat and inappropriate. Affect is not labile, blunt or angry.        Speech: Speech is not delayed or slurred.        Behavior: Behavior normal. Behavior is not agitated.        Thought Content: Thought content is paranoid. Thought content does not include homicidal or suicidal ideation. Thought content does not include homicidal or suicidal plan.        Cognition and Memory: Cognition is impaired. She exhibits impaired recent memory.     Comments: Odd facial grimacing with intrusive thoughts.  Chronic stereotypic and repetitive gestures especially when walking. Chronically anxious and very poor social skills but not uncooperative. Rigid. 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 Poor insight and judgment fair. Obsesses on weight.    Affect chronically distressed. Stereotypic movments. Occ AH and VH  Reduced concentration. Repeats questions    Brought picture frames she dressed up with crafts to show.again   Lab Review:     Component Value Date/Time   NA 141 03/12/2019 1455   K 4.2 03/12/2019 1455   CL 103 03/12/2019 1455   CO2 31 03/12/2019 1455   GLUCOSE 105 (H) 03/12/2019 1455   BUN 20 03/12/2019 1455   CREATININE 0.86 03/12/2019 1455   CALCIUM 9.4 03/12/2019 1455   PROT 6.4 03/12/2019 1455   ALBUMIN 4.4 03/12/2019 1455   AST 18 03/12/2019 1455   ALT 17 03/12/2019 1455   ALKPHOS 98 03/12/2019 1455   BILITOT 0.3 03/12/2019 1455       Component Value Date/Time   WBC 6.2 11/20/2019 1104  WBC 5.5 03/12/2019 1455   RBC 4.56 11/20/2019 1104   RBC 4.47 03/12/2019 1455   HGB 12.8  11/20/2019 1104   HCT 38.2 11/20/2019 1104   PLT 212 11/20/2019 1104   MCV 84 11/20/2019 1104   MCH 28.1 11/20/2019 1104   MCHC 33.5 11/20/2019 1104   MCHC 33.2 03/12/2019 1455   RDW 14.0 11/20/2019 1104   LYMPHSABS 1.2 11/20/2019 1104   MONOABS 0.3 08/11/2014 1243   EOSABS 0.0 11/20/2019 1104   BASOSABS 0.0 11/20/2019 1104   ANC count has been stable. No results found for: POCLITH, LITHIUM   No results found for: PHENYTOIN, PHENOBARB, VALPROATE, CBMZ   .res Assessment: Plan:    Kaila was seen today for follow-up, hallucinations, schizophrenia, paranoid and anxiety.  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  Greater than 50% of 30 min face to face time with patient was spent on counseling and coordination of care. We discussed her Chronic severe schizophrenic symptoms of paranoia, AH and negative symptoms.  She has not improved nor changed significantly since the last visit nor significantly since when she was first seen in a number of years ago.. Not able to function or self-care without the help of her sister.  Have adjusted the clozapine levels up and down without much difference in her overall symptoms.  She is currently taking clozapine 500 mg daily as she did not improve at the higher dosages.   Chronically easily overwhelmed.   Needs help from sister. Needs instructions repeated. Disc dx again today bc she was uncertain about what a psychiatrist does.  She has chronic constipation and it flared up lately.Marland KitchenAnswered questions about Linzess.   Still takes Miralax. It's possible clozapine contributes to the problem.  Disc this in depth bc she had questions.   Rec talk with PCP about worsening constipation and diarrhea.  ANC has been stable for clozapine RX.  Disc risk of aplastic anemia. Continue monthly CBC.Disc compliance with her medications.  She has been consistent. Continue clozapine 500 mg daily. Librium 10 BID.  We discussed the short-term risks associated with benzodiazepines including sedation and increased fall risk among others.  Discussed long-term side effect risk including dependence, potential withdrawal symptoms, and the potential eventual dose-related risk of dementia. She appears to benefit from the librium.   No med changes are likely to help.  Therfore not recommended but consider Luvox  CBT around dealing with voices and telling herself that it is the illness causing the voices and sometimes that helps her.  She was open about the intrusive thoughts and voices today generally.  Benefit from support noted.  Needs a lot of support and consistency bc of her anxiety and obsessions.  She seeks reassurance.  She feels chronically fragile.  She has no social support except for her family.  Disc ways to deal with condemning voices.  Consider fluvoxamine trial again for obsessive anxiety.  Will need to watch DDI with clozapine.  Defer.  Therefore FU every 10 weeks  Lynder Parents, MD, DFAPA   Please see After Visit Summary for patient  specific instructions.  No future appointments.  Orders Placed This Encounter  Procedures  . CBC with Differential/Platelet  . CBC with Differential/Platelet      -------------------------------

## 2020-03-03 ENCOUNTER — Other Ambulatory Visit: Payer: Self-pay | Admitting: Psychiatry

## 2020-03-03 DIAGNOSIS — F411 Generalized anxiety disorder: Secondary | ICD-10-CM

## 2020-03-04 NOTE — Telephone Encounter (Signed)
Due back 05/05/2020

## 2020-03-05 ENCOUNTER — Other Ambulatory Visit: Payer: Self-pay | Admitting: Internal Medicine

## 2020-03-05 DIAGNOSIS — Z1231 Encounter for screening mammogram for malignant neoplasm of breast: Secondary | ICD-10-CM

## 2020-03-12 ENCOUNTER — Encounter: Payer: Self-pay | Admitting: Psychiatry

## 2020-03-25 ENCOUNTER — Encounter: Payer: Self-pay | Admitting: Psychiatry

## 2020-03-31 ENCOUNTER — Ambulatory Visit (INDEPENDENT_AMBULATORY_CARE_PROVIDER_SITE_OTHER): Payer: Medicare Other | Admitting: Internal Medicine

## 2020-03-31 ENCOUNTER — Encounter: Payer: Self-pay | Admitting: Internal Medicine

## 2020-03-31 ENCOUNTER — Other Ambulatory Visit: Payer: Self-pay

## 2020-03-31 VITALS — BP 124/68 | HR 109 | Temp 98.2°F | Ht 61.0 in | Wt 118.2 lb

## 2020-03-31 DIAGNOSIS — E785 Hyperlipidemia, unspecified: Secondary | ICD-10-CM | POA: Diagnosis not present

## 2020-03-31 DIAGNOSIS — Z23 Encounter for immunization: Secondary | ICD-10-CM | POA: Diagnosis not present

## 2020-03-31 DIAGNOSIS — Z0000 Encounter for general adult medical examination without abnormal findings: Secondary | ICD-10-CM

## 2020-03-31 DIAGNOSIS — E538 Deficiency of other specified B group vitamins: Secondary | ICD-10-CM | POA: Diagnosis not present

## 2020-03-31 DIAGNOSIS — K219 Gastro-esophageal reflux disease without esophagitis: Secondary | ICD-10-CM

## 2020-03-31 DIAGNOSIS — M810 Age-related osteoporosis without current pathological fracture: Secondary | ICD-10-CM

## 2020-03-31 LAB — COMPREHENSIVE METABOLIC PANEL
ALT: 21 U/L (ref 0–35)
AST: 19 U/L (ref 0–37)
Albumin: 4.3 g/dL (ref 3.5–5.2)
Alkaline Phosphatase: 119 U/L — ABNORMAL HIGH (ref 39–117)
BUN: 18 mg/dL (ref 6–23)
CO2: 31 mEq/L (ref 19–32)
Calcium: 9.5 mg/dL (ref 8.4–10.5)
Chloride: 103 mEq/L (ref 96–112)
Creatinine, Ser: 0.82 mg/dL (ref 0.40–1.20)
GFR: 74.17 mL/min (ref >60.00)
Glucose, Bld: 88 mg/dL (ref 70–99)
Potassium: 4.3 mEq/L (ref 3.5–5.1)
Sodium: 140 mEq/L (ref 135–145)
Total Bilirubin: 0.4 mg/dL (ref 0.2–1.2)
Total Protein: 6.5 g/dL (ref 6.0–8.3)

## 2020-03-31 LAB — LIPID PANEL
Cholesterol: 172 mg/dL (ref 0–200)
HDL: 63.6 mg/dL (ref >39.00)
LDL Cholesterol: 77 mg/dL (ref 0–99)
NonHDL: 108.08
Total CHOL/HDL Ratio: 3
Triglycerides: 156 mg/dL — ABNORMAL HIGH (ref 0.0–149.0)
VLDL: 31.2 mg/dL (ref 0.0–40.0)

## 2020-03-31 LAB — HEMOGLOBIN A1C: Hgb A1c MFr Bld: 5.6 % (ref 4.6–6.5)

## 2020-03-31 NOTE — Assessment & Plan Note (Signed)
Taking B12 daily 

## 2020-03-31 NOTE — Assessment & Plan Note (Signed)
Uses pepcid and tums. Good control overall.

## 2020-03-31 NOTE — Assessment & Plan Note (Signed)
On boniva. Stable and with next dexa and consider shift to prolia.

## 2020-03-31 NOTE — Assessment & Plan Note (Signed)
Checking labs and adjust simvastatin 40 mg daily as needed. CV risk about 6%.

## 2020-03-31 NOTE — Progress Notes (Signed)
Subjective:   Patient ID: Nancy Brooks, female    DOB: Jun 27, 1950, 69 y.o.   MRN: [id number]  HPI Here for medicare wellness and physical, no new complaints. Please see A/P for status and treatment of chronic medical problems.   Diet: heart healthy Physical activity: sedentary Depression/mood screen: sees pysch for schizophrenia Hearing: intact to whispered voice Visual acuity: grossly normal, performs annual eye exam  ADLs: capable Fall risk: none Home safety: good Cognitive evaluation: intact to orientation, naming, recall and repetition EOL planning: adv directives discussed    Office Visit from 03/31/2020 in [place] at [facility] Total Score 2        Office Visit from 07/17/2013 in [place]  PHQ-9 Total Score 6     I have personally reviewed and have noted 1. The patient's medical and social history - reviewed today no changes 2. Their use of alcohol, tobacco or illicit drugs 3. Their current medications and supplements 4. The patient's functional ability including ADL's, fall risks, home safety risks and hearing or visual impairment. 5. Diet and physical activities 6. Evidence for depression or mood disorders 7. Care team reviewed and updated  Patient Care Team: Hoyt Koch, MD as PCP - General (Internal Medicine) Lafayette Dragon, MD (Inactive) as Consulting Physician (Gastroenterology) Cottle, Billey Co., MD as Consulting Physician (Psychiatry) Marylynn Pearson, MD (Obstetrics and Gynecology) Past Medical History:  Diagnosis Date  . Anxiety   . BURSITIS, RIGHT KNEE   . Depression   . Dyskinesia of esophagus   . GAIT DISTURBANCE   . GERD   . HEMORRHOIDS, NOS   . HYPERLIPIDEMIA   . MENOPAUSAL SYNDROME   . Osteoporosis dx 03/2011   DEXA at gyn, started boniva in additon to Ca+D, changed to [place] 09/2012  . SCHIZOPHRENIA   . SYMPTOM, INCONTINENCE, URGE    Past Surgical History:  Procedure  Laterality Date  . BREAST SURGERY  05/09/1998   left breast biopsy  . COLONOSCOPY  03-16-2010   Family History  Problem Relation Age of Onset  . Hypertension Mother   . Depression Mother   . Prostate cancer Father   . Colon cancer Brother 87  . Esophageal cancer Neg Hx   . Pancreatic cancer Neg Hx   . Rectal cancer Neg Hx   . Stomach cancer Neg Hx    Review of Systems  Constitutional: Negative.   HENT: Negative.   Eyes: Negative.   Respiratory: Negative for cough, chest tightness and shortness of breath.   Cardiovascular: Negative for chest pain, palpitations and leg swelling.  Gastrointestinal: Negative for abdominal distention, abdominal pain, constipation, diarrhea, nausea and vomiting.  Musculoskeletal: Negative.   Skin: Negative.   Neurological: Negative.   Psychiatric/Behavioral: Negative.     Objective:  Physical Exam Constitutional:      Appearance: She is well-developed.  HENT:     Head: Normocephalic and atraumatic.  Cardiovascular:     Rate and Rhythm: Normal rate and regular rhythm.  Pulmonary:     Effort: Pulmonary effort is normal. No respiratory distress.     Breath sounds: Normal breath sounds. No wheezing or rales.  Abdominal:     General: Bowel sounds are normal. There is no distension.     Palpations: Abdomen is soft.     Tenderness: There is no abdominal tenderness. There is no rebound.  Musculoskeletal:     Cervical back: Normal range of motion.  Skin:    General:  Skin is warm and dry.  Neurological:     Mental Status: She is alert and oriented to person, place, and time.     Coordination: Coordination normal.     Comments: Some slowing of cognition but appropriate answers to questions.      Vitals:   03/31/20 1331  BP: 124/68  Pulse: (!) 109  Temp: 98.2 F (36.8 C)  TempSrc: Oral  SpO2: 96%  Weight: 118 lb 3.2 oz (53.6 kg)  Height: 5\' 1"  (1.549 m)   This visit occurred during the SARS-CoV-2 public health emergency.  Safety protocols  were in place, including screening questions prior to the visit, additional usage of staff PPE, and extensive cleaning of exam room while observing appropriate contact time as indicated for disinfecting solutions.   Assessment & Plan:  Flu shot and pneumonia 23 given at visit

## 2020-03-31 NOTE — Patient Instructions (Signed)
Health Maintenance, Female Adopting a healthy lifestyle and getting preventive care are important in promoting health and wellness. Ask your health care provider about:  The right schedule for you to have regular tests and exams.  Things you can do on your own to prevent diseases and keep yourself healthy. What should I know about diet, weight, and exercise? Eat a healthy diet   Eat a diet that includes plenty of vegetables, fruits, low-fat dairy products, and lean protein.  Do not eat a lot of foods that are high in solid fats, added sugars, or sodium. Maintain a healthy weight Body mass index (BMI) is used to identify weight problems. It estimates body fat based on height and weight. Your health care provider can help determine your BMI and help you achieve or maintain a healthy weight. Get regular exercise Get regular exercise. This is one of the most important things you can do for your health. Most adults should:  Exercise for at least 150 minutes each week. The exercise should increase your heart rate and make you sweat (moderate-intensity exercise).  Do strengthening exercises at least twice a week. This is in addition to the moderate-intensity exercise.  Spend less time sitting. Even light physical activity can be beneficial. Watch cholesterol and blood lipids Have your blood tested for lipids and cholesterol at 67 years of age, then have this test every 5 years. Have your cholesterol levels checked more often if:  Your lipid or cholesterol levels are high.  You are older than 67 years of age.  You are at high risk for heart disease. What should I know about cancer screening? Depending on your health history and family history, you may need to have cancer screening at various ages. This may include screening for:  Breast cancer.  Cervical cancer.  Colorectal cancer.  Skin cancer.  Lung cancer. What should I know about heart disease, diabetes, and high blood  pressure? Blood pressure and heart disease  High blood pressure causes heart disease and increases the risk of stroke. This is more likely to develop in people who have high blood pressure readings, are of African descent, or are overweight.  Have your blood pressure checked: ? Every 3-5 years if you are 18-39 years of age. ? Every year if you are 40 years old or older. Diabetes Have regular diabetes screenings. This checks your fasting blood sugar level. Have the screening done:  Once every three years after age 40 if you are at a normal weight and have a low risk for diabetes.  More often and at a younger age if you are overweight or have a high risk for diabetes. What should I know about preventing infection? Hepatitis B If you have a higher risk for hepatitis B, you should be screened for this virus. Talk with your health care provider to find out if you are at risk for hepatitis B infection. Hepatitis C Testing is recommended for:  Everyone born from 1945 through 1965.  Anyone with known risk factors for hepatitis C. Sexually transmitted infections (STIs)  Get screened for STIs, including gonorrhea and chlamydia, if: ? You are sexually active and are younger than 67 years of age. ? You are older than 67 years of age and your health care provider tells you that you are at risk for this type of infection. ? Your sexual activity has changed since you were last screened, and you are at increased risk for chlamydia or gonorrhea. Ask your health care provider if   you are at risk.  Ask your health care provider about whether you are at high risk for HIV. Your health care provider may recommend a prescription medicine to help prevent HIV infection. If you choose to take medicine to prevent HIV, you should first get tested for HIV. You should then be tested every 3 months for as long as you are taking the medicine. Pregnancy  If you are about to stop having your period (premenopausal) and  you may become pregnant, seek counseling before you get pregnant.  Take 400 to 800 micrograms (mcg) of folic acid every day if you become pregnant.  Ask for birth control (contraception) if you want to prevent pregnancy. Osteoporosis and menopause Osteoporosis is a disease in which the bones lose minerals and strength with aging. This can result in bone fractures. If you are 65 years old or older, or if you are at risk for osteoporosis and fractures, ask your health care provider if you should:  Be screened for bone loss.  Take a calcium or vitamin D supplement to lower your risk of fractures.  Be given hormone replacement therapy (HRT) to treat symptoms of menopause. Follow these instructions at home: Lifestyle  Do not use any products that contain nicotine or tobacco, such as cigarettes, e-cigarettes, and chewing tobacco. If you need help quitting, ask your health care provider.  Do not use street drugs.  Do not share needles.  Ask your health care provider for help if you need support or information about quitting drugs. Alcohol use  Do not drink alcohol if: ? Your health care provider tells you not to drink. ? You are pregnant, may be pregnant, or are planning to become pregnant.  If you drink alcohol: ? Limit how much you use to 0-1 drink a day. ? Limit intake if you are breastfeeding.  Be aware of how much alcohol is in your drink. In the [place] one drink equals one 12 oz bottle of beer (355 mL), one 5 oz glass of wine (148 mL), or one 1 oz glass of hard liquor (44 mL). General instructions  Schedule regular health, dental, and eye exams.  Stay current with your vaccines.  Tell your health care provider if: ? You often feel depressed. ? You have ever been abused or do not feel safe at home. Summary  Adopting a healthy lifestyle and getting preventive care are important in promoting health and wellness.  Follow your health care provider's instructions about healthy  diet, exercising, and getting tested or screened for diseases.  Follow your health care provider's instructions on monitoring your cholesterol and blood pressure. This information is not intended to replace advice given to you by your health care provider. Make sure you discuss any questions you have with your health care provider. Document Revised: 04/18/2018 Document Reviewed: 04/18/2018 Elsevier Patient Education  2020 [employer]  

## 2020-03-31 NOTE — Assessment & Plan Note (Signed)
Flu shot given. Covid-19 complete including booster. Pneumonia given 23 to complete series. Shingrix complete. Tetanus due 2026. Colonoscopy due 2022. Mammogram due 2022, pap smear aged out and dexa due 2023. Counseled about sun safety and mole surveillance. Counseled about the dangers of distracted driving. Given 10 year screening recommendations.

## 2020-04-08 ENCOUNTER — Other Ambulatory Visit: Payer: Self-pay | Admitting: Psychiatry

## 2020-04-09 LAB — CBC WITH DIFFERENTIAL/PLATELET
Basophils Absolute: 0 10*3/uL (ref 0.0–0.2)
Basos: 0 %
EOS (ABSOLUTE): 0 10*3/uL (ref 0.0–0.4)
Eos: 0 %
Hematocrit: 36.8 % (ref 34.0–46.6)
Hemoglobin: 12.4 g/dL (ref 11.1–15.9)
Immature Grans (Abs): 0 10*3/uL (ref 0.0–0.1)
Immature Granulocytes: 0 %
Lymphocytes Absolute: 1.3 10*3/uL (ref 0.7–3.1)
Lymphs: 21 %
MCH: 29.2 pg (ref 26.6–33.0)
MCHC: 33.7 g/dL (ref 31.5–35.7)
MCV: 87 fL (ref 79–97)
Monocytes Absolute: 0.5 10*3/uL (ref 0.1–0.9)
Monocytes: 9 %
Neutrophils Absolute: 4.3 10*3/uL (ref 1.4–7.0)
Neutrophils: 70 %
Platelets: 235 10*3/uL (ref 150–450)
RBC: 4.25 x10E6/uL (ref 3.77–5.28)
RDW: 13.1 % (ref 11.7–15.4)
WBC: 6.1 10*3/uL (ref 3.4–10.8)

## 2020-04-14 ENCOUNTER — Ambulatory Visit
Admission: RE | Admit: 2020-04-14 | Discharge: 2020-04-14 | Disposition: A | Discharge status: Home / Self Care | Payer: Medicare Other | Source: Ambulatory Visit | Attending: Internal Medicine | Admitting: Internal Medicine

## 2020-04-14 ENCOUNTER — Other Ambulatory Visit: Payer: Self-pay

## 2020-04-14 DIAGNOSIS — Z1231 Encounter for screening mammogram for malignant neoplasm of breast: Secondary | ICD-10-CM

## 2020-05-05 ENCOUNTER — Other Ambulatory Visit: Payer: Self-pay

## 2020-05-05 ENCOUNTER — Ambulatory Visit (INDEPENDENT_AMBULATORY_CARE_PROVIDER_SITE_OTHER): Payer: Medicare Other | Admitting: Psychiatry

## 2020-05-05 ENCOUNTER — Encounter: Payer: Self-pay | Admitting: Psychiatry

## 2020-05-05 DIAGNOSIS — Z79899 Other long term (current) drug therapy: Secondary | ICD-10-CM

## 2020-05-05 DIAGNOSIS — K5903 Drug induced constipation: Secondary | ICD-10-CM | POA: Diagnosis not present

## 2020-05-05 DIAGNOSIS — F411 Generalized anxiety disorder: Secondary | ICD-10-CM | POA: Diagnosis not present

## 2020-05-05 DIAGNOSIS — F200 Paranoid schizophrenia: Secondary | ICD-10-CM

## 2020-05-05 MED ORDER — CLOZAPINE 100 MG PO TABS: ORAL_TABLET | ORAL | 11 refills | Status: DC

## 2020-05-05 NOTE — Progress Notes (Signed)
Nancy Brooks [id number] 02/18/1951 69 y.o.  Subjective:   Patient ID:  Nancy Brooks is a 69 y.o. (DOB 28-May-1950) female.  Chief Complaint:  Chief Complaint  Patient presents with  . Follow-up  . Anxiety  . Hallucinations    HPI  RHONA FUSILIER presentse today for follow-up of schizophrenia with severe negative symptoms.  09/11/2019 the following is noted: Had problems with standing order for unclear reason.  Tried to address that problem. Brought pictures she'd drawin in HS. Still experiences noises in the house that frighten her.  Back to [employer] and [employer]. No meds were changed.  11/26/2019 appointment with the following noted: Sx continue as noted before.  Still doing crafts and brought examples.  Enjoys this. Still has repetitive motion rituals and touching rituals.   More constipation and diarrhea lately. [name] wanted her to mention more trouble going to sleep.  Probably 6-7 hours of sleep. Checks weight once daily.   New decision to maintain weight at 110#.   02/25/20 appt with the following noted: Still making crafts.  Brought some to show MD. Still has episodes of hearing people outside her house causing anxiety.  Didn't get up right away but when she did they had left.  No knocks on the door. Unusual experiences driving car but no accidents.  If has flat tire or car problem then calls [name] for help. Has worried she blasphemed the [facility] lately and has heard condemning weights.  Does not read much.  Watches some TV.  Pt reports that mood is Anxious and describes anxiety as Moderate. Anxiety symptoms include: excessive and obsessive worry.  Paranoia. . Chronic intrusive dysphoric thoughts usually of sexual nature or that she's going to hell.  Pt reports some sleep issues. Pt reports that appetite is good. Pt reports that energy is no change and good. Concentration is good. Suicidal thoughts:  denied by patient.  Does not have a lot of interests  chronically.  Anxious around people.  Chronic AH makes her anxious.   Worries about "uncleanness".  Not unusually anxious at home.  Has worries today she's afraid to discuss over the phone.   Does her own grocery shopping.  Sister helps with bills and appts.. Talks to her daily.   Little other social contact.   Still goes to [employer] on Sundays.  B will call and do chores at times.  Admits she lies to herself at times.  Going to [employer] and [employer] more and [employer] 3 times weekly which [name] directs. Had flat tire and didn't recognize it initially but then got help with it.  Car is 67 years old and will get another. Plan: Continue clozapine 500 mg daily. Librium 10 BID.  05/05/2020 appointment with the following noted: Sister sent note saying pt may have missed 2 weeks of clozapine.  Pt says that's not true.  Disc risk from this and sent in RX to  Pharmacy with lab. Problems with car.  Got a new car.  Sister helping her learn to drive it. Still intrusive, "crazy" thoughts and voices.     GI doctor, Hyacinth Meeker, PA addressed bowel problems.  Taking Miralax and Linzess.  Compliant and no med problems. [name] helps her with store shopping and gas station.     Multiple prior psych hospitalizations before starting clozapine.  Review of Systems:  Review of Systems  Constitutional: Negative for appetite change and unexpected weight change.  Cardiovascular: Negative for palpitations.  Gastrointestinal: Positive for  diarrhea. Negative for constipation.  Musculoskeletal: Positive for arthralgias.       Foot pain and plans to see a doctor  Neurological: Negative for dizziness, tremors, weakness and headaches.  Psychiatric/Behavioral: Positive for behavioral problems, confusion, decreased concentration and hallucinations. Negative for agitation, dysphoric mood, self-injury, sleep disturbance and suicidal ideas. The patient is nervous/anxious. The patient is not hyperactive.      Medications: I have reviewed the patient's current medications.  Current Outpatient Medications  Medication Sig Dispense Refill  . aspirin 81 MG tablet Take 81 mg by mouth daily.    . calcium elemental as carbonate (BARIATRIC TUMS ULTRA) 400 MG chewable tablet Chew 1,000 mg by mouth daily.    . chlordiazePOXIDE (LIBRIUM) 10 MG capsule TAKE 1 CAPSULE(10 MG) BY MOUTH TWICE DAILY 60 capsule 5  . Cholecalciferol (VITAMIN D3) 1000 UNITS CAPS Take by mouth daily.    . cloZAPine (CLOZARIL) 100 MG tablet TAKE 1 TABLET BY MOUTH EVERY MORNING AND 4 TABLETS EVERY EVENING 150 tablet 11  . famotidine (PEPCID) 10 MG tablet Take 10 mg by mouth daily.     [name] ibandronate (BONIVA) 150 MG tablet SEE NOTES 3 tablet 3  . LINZESS 72 MCG capsule TAKE 1 CAPSULE(72 MCG) BY MOUTH DAILY 30 capsule 3  . polyethylene glycol (MIRALAX / GLYCOLAX) 17 g packet Take 17 g by mouth daily as needed.    . simvastatin (ZOCOR) 40 MG tablet TAKE 1 TABLET(40 MG) BY MOUTH DAILY AT 6 PM 90 tablet 3  . vitamin B-12 (CYANOCOBALAMIN) 1000 MCG tablet Take 1 tablet (1,000 mcg total) by mouth daily. 90 tablet 3   No current facility-administered medications for this visit.    Medication Side Effects: None.  Unless GI.  Allergies: No Known Allergies  Past Medical History:  Diagnosis Date  . Anxiety   . BURSITIS, RIGHT KNEE   . Depression   . Dyskinesia of esophagus   . GAIT DISTURBANCE   . GERD   . HEMORRHOIDS, NOS   . HYPERLIPIDEMIA   . MENOPAUSAL SYNDROME   . Osteoporosis dx 03/2011   DEXA at gyn, started boniva in additon to Ca+D, changed to [place] 09/2012  . SCHIZOPHRENIA   . SYMPTOM, INCONTINENCE, URGE     Family History  Problem Relation Age of Onset  . Hypertension Mother   . Depression Mother   . Prostate cancer Father   . Colon cancer Brother 64  . Esophageal cancer Neg Hx   . Pancreatic cancer Neg Hx   . Rectal cancer Neg Hx   . Stomach cancer Neg Hx     Social History   Socioeconomic History  .  Marital status: Single    Spouse name: Not on file  . Number of children: Not on file  . Years of education: Not on file  . Highest education level: Not on file  Occupational History  . Not on file  Tobacco Use  . Smoking status: Never Smoker  . Smokeless tobacco: Never Used  Substance and Sexual Activity  . Alcohol use: No    Alcohol/week: 0.0 standard drinks  . Drug use: No  . Sexual activity: Not on file  Other Topics Concern  . Not on file  Social History Narrative   Lives alone in single family home. Sister provides support and come to visits. Pt drives and indep ADLs.    Social Determinants of Health   Financial Resource Strain: Not on file  Food Insecurity: Not on file  Transportation Needs: Not  on file  Physical Activity: Not on file  Stress: Not on file  Social Connections: Not on file  Intimate Partner Violence: Not on file  sister [name] 70 yo B Johnny 28 yo.  Past Medical History, Surgical history, Social history, and Family history were reviewed and updated as appropriate.   Please see review of systems for further details on the patient's review from today.   Objective:   Physical Exam:  There were no vitals taken for this visit.  Physical Exam Constitutional:      General: She is not in acute distress.    Appearance: She is well-developed.  Musculoskeletal:        General: No deformity.  Neurological:     Mental Status: She is alert and oriented to person, place, and time.     Motor: Tremor present.     Coordination: Coordination normal.     Gait: Gait abnormal.     Comments: Stereotypic compulsive style of walking with some retracing steps.  Psychiatric:        Attention and Perception: Attention normal. She is attentive. She perceives auditory hallucinations. She does not perceive visual hallucinations.        Mood and Affect: Mood is anxious. Mood is not depressed. Affect is flat and inappropriate. Affect is not labile, blunt or angry.         Speech: Speech is not delayed or slurred.        Behavior: Behavior normal. Behavior is not agitated.        Thought Content: Thought content is paranoid. Thought content does not include homicidal or suicidal ideation. Thought content does not include homicidal or suicidal plan.        Cognition and Memory: Cognition is impaired. She exhibits impaired recent memory.     Comments: Odd facial grimacing with intrusive thoughts.  Chronic stereotypic and repetitive gestures especially when walking. Chronically anxious and very poor social skills but not uncooperative. Rigid. 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 Poor insight and judgment fair. Obsesses on weight.    Affect chronically distressed.Occ AH and VH  Reduced concentration. Repeats questions    Brought picture frames she dressed up with crafts to show.again   Lab Review:     Component Value Date/Time   NA 140 03/31/2020 1356   K 4.3 03/31/2020 1356   CL 103 03/31/2020 1356   CO2 31 03/31/2020 1356   GLUCOSE 88 03/31/2020 1356   BUN 18 03/31/2020 1356   CREATININE 0.82 03/31/2020 1356   CALCIUM 9.5 03/31/2020 1356   PROT 6.5 03/31/2020 1356   ALBUMIN 4.3 03/31/2020 1356   AST 19 03/31/2020 1356   ALT 21 03/31/2020 1356   ALKPHOS 119 (H) 03/31/2020 1356   BILITOT 0.4 03/31/2020 1356       Component Value Date/Time   WBC 6.1 04/08/2020 1025   WBC 5.5 03/12/2019 1455   RBC 4.25 04/08/2020 1025   RBC 4.47 03/12/2019 1455   HGB 12.4 04/08/2020 1025   HCT 36.8 04/08/2020 1025   PLT 235 04/08/2020 1025   MCV 87 04/08/2020 1025   MCH 29.2 04/08/2020 1025   MCHC 33.7 04/08/2020 1025   MCHC 33.2 03/12/2019 1455   RDW 13.1 04/08/2020 1025   LYMPHSABS 1.3 04/08/2020 1025   MONOABS 0.3 08/11/2014 1243   EOSABS 0.0 04/08/2020 1025   BASOSABS 0.0 04/08/2020 1025   ANC count has been stable. No results found for: POCLITH, LITHIUM   No results found for:  PHENYTOIN, PHENOBARB, VALPROATE, CBMZ    .res Assessment: Plan:    Kirana was seen today for follow-up, anxiety and hallucinations.  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  Greater than 50% of 30 min face to face time with patient was spent on counseling and coordination of care. We discussed her Chronic severe schizophrenic symptoms of paranoia, AH and negative symptoms.  She has not improved nor changed significantly since the last visit nor significantly since when she was first seen in a number of years ago.. Not able to function or self-care without the help of her sister.  Have adjusted the clozapine levels up and down without much difference in her overall symptoms.  She is currently taking clozapine 500 mg daily as she did not improve at the higher dosages.   Chronically easily overwhelmed.   Needs help from sister. Needs instructions repeated.  She has chronic constipation and it's better lately.. Still takes Miralax. It's possible clozapine contributes to the problem.  Rec talk with PCP about any worsening constipation and diarrhea.  ANC has been stable for clozapine RX.  Disc risk of aplastic anemia. Continue monthly CBC.Disc compliance with her medications.  She has been consistent as far as we are aware until recently. Continue clozapine 500 mg daily. Librium 10 BID.  We discussed the short-term risks associated with benzodiazepines including sedation and increased fall risk among others.  Discussed long-term side effect risk including dependence, potential withdrawal symptoms, and the potential eventual dose-related risk of dementia. She appears  to benefit from the librium.   No med changes are likely to help.  Therfore not recommended but consider Luvox  CBT around dealing with voices and telling herself that it is the illness causing the voices and sometimes that helps her.  She was open about the intrusive thoughts and voices today generally.  Benefit from support noted.  Needs a lot of support and consistency bc of her anxiety and obsessions.  She seeks reassurance.  She feels chronically fragile.  She has no social support except for her family.  Disc ways to deal with condemning voices.  Consider fluvoxamine trial again for obsessive anxiety.  Will need to watch DDI with clozapine.  Defer.  Therefore FU every 10 weeks  Lynder Parents, MD, DFAPA   Please see After Visit Summary for patient specific instructions.  No future appointments.  No orders of the defined types were placed in this encounter.     -------------------------------

## 2020-05-07 ENCOUNTER — Other Ambulatory Visit: Payer: Self-pay | Admitting: Psychiatry

## 2020-05-08 LAB — CBC WITH DIFFERENTIAL/PLATELET
Basophils Absolute: 0 10*3/uL (ref 0.0–0.2)
Basos: 0 %
EOS (ABSOLUTE): 0 10*3/uL (ref 0.0–0.4)
Eos: 0 %
Hematocrit: 38.4 % (ref 34.0–46.6)
Hemoglobin: 12.6 g/dL (ref 11.1–15.9)
Immature Grans (Abs): 0 10*3/uL (ref 0.0–0.1)
Immature Granulocytes: 0 %
Lymphocytes Absolute: 1.2 10*3/uL (ref 0.7–3.1)
Lymphs: 19 %
MCH: 28.4 pg (ref 26.6–33.0)
MCHC: 32.8 g/dL (ref 31.5–35.7)
MCV: 87 fL (ref 79–97)
Monocytes Absolute: 0.5 10*3/uL (ref 0.1–0.9)
Monocytes: 8 %
Neutrophils Absolute: 4.5 10*3/uL (ref 1.4–7.0)
Neutrophils: 73 %
Platelets: 234 10*3/uL (ref 150–450)
RBC: 4.44 x10E6/uL (ref 3.77–5.28)
RDW: 13.8 % (ref 11.7–15.4)
WBC: 6.2 10*3/uL (ref 3.4–10.8)

## 2020-05-18 ENCOUNTER — Telehealth: Payer: Self-pay | Admitting: Physician Assistant

## 2020-05-18 ENCOUNTER — Other Ambulatory Visit: Payer: Self-pay

## 2020-05-18 MED ORDER — LINACLOTIDE 72 MCG PO CAPS: ORAL_CAPSULE | ORAL | 3 refills | Status: DC

## 2020-05-18 NOTE — Telephone Encounter (Signed)
Patient requesting refill on Linzess

## 2020-05-18 NOTE — Telephone Encounter (Signed)
Sent refill to patients pharmacy. 

## 2020-06-04 ENCOUNTER — Other Ambulatory Visit: Payer: Self-pay | Admitting: Psychiatry

## 2020-06-05 LAB — CBC WITH DIFFERENTIAL/PLATELET
Basophils Absolute: 0 10*3/uL (ref 0.0–0.2)
Basos: 0 %
EOS (ABSOLUTE): 0 10*3/uL (ref 0.0–0.4)
Eos: 0 %
Hematocrit: 40.2 % (ref 34.0–46.6)
Hemoglobin: 13.4 g/dL (ref 11.1–15.9)
Immature Grans (Abs): 0 10*3/uL (ref 0.0–0.1)
Immature Granulocytes: 0 %
Lymphocytes Absolute: 1.2 10*3/uL (ref 0.7–3.1)
Lymphs: 19 %
MCH: 28.3 pg (ref 26.6–33.0)
MCHC: 33.3 g/dL (ref 31.5–35.7)
MCV: 85 fL (ref 79–97)
Monocytes Absolute: 0.4 10*3/uL (ref 0.1–0.9)
Monocytes: 7 %
Neutrophils Absolute: 4.7 10*3/uL (ref 1.4–7.0)
Neutrophils: 74 %
Platelets: 235 10*3/uL (ref 150–450)
RBC: 4.73 x10E6/uL (ref 3.77–5.28)
RDW: 13.7 % (ref 11.7–15.4)
WBC: 6.3 10*3/uL (ref 3.4–10.8)

## 2020-07-02 ENCOUNTER — Other Ambulatory Visit: Payer: Self-pay | Admitting: Psychiatry

## 2020-07-03 LAB — CBC WITH DIFFERENTIAL/PLATELET
Basophils Absolute: 0 10*3/uL (ref 0.0–0.2)
Basos: 0 %
EOS (ABSOLUTE): 0 10*3/uL (ref 0.0–0.4)
Eos: 0 %
Hematocrit: 38.1 % (ref 34.0–46.6)
Hemoglobin: 12.9 g/dL (ref 11.1–15.9)
Immature Grans (Abs): 0 10*3/uL (ref 0.0–0.1)
Immature Granulocytes: 1 %
Lymphocytes Absolute: 1.2 10*3/uL (ref 0.7–3.1)
Lymphs: 22 %
MCH: 29.2 pg (ref 26.6–33.0)
MCHC: 33.9 g/dL (ref 31.5–35.7)
MCV: 86 fL (ref 79–97)
Monocytes Absolute: 0.4 10*3/uL (ref 0.1–0.9)
Monocytes: 8 %
Neutrophils Absolute: 3.9 10*3/uL (ref 1.4–7.0)
Neutrophils: 69 %
Platelets: 236 10*3/uL (ref 150–450)
RBC: 4.42 x10E6/uL (ref 3.77–5.28)
RDW: 14 % (ref 11.7–15.4)
WBC: 5.6 10*3/uL (ref 3.4–10.8)

## 2020-07-14 ENCOUNTER — Ambulatory Visit (INDEPENDENT_AMBULATORY_CARE_PROVIDER_SITE_OTHER): Payer: Medicare Other | Admitting: Psychiatry

## 2020-07-14 ENCOUNTER — Other Ambulatory Visit: Payer: Self-pay

## 2020-07-14 ENCOUNTER — Encounter: Payer: Self-pay | Admitting: Psychiatry

## 2020-07-14 DIAGNOSIS — F200 Paranoid schizophrenia: Secondary | ICD-10-CM | POA: Diagnosis not present

## 2020-07-14 DIAGNOSIS — F411 Generalized anxiety disorder: Secondary | ICD-10-CM

## 2020-07-14 DIAGNOSIS — Z79899 Other long term (current) drug therapy: Secondary | ICD-10-CM | POA: Diagnosis not present

## 2020-07-14 DIAGNOSIS — K5903 Drug induced constipation: Secondary | ICD-10-CM

## 2020-07-14 MED ORDER — CHLORDIAZEPOXIDE HCL 10 MG PO CAPS: 10.0000 mg | ORAL_CAPSULE | Freq: Two times a day (BID) | ORAL | 5 refills | Status: DC

## 2020-07-14 MED ORDER — CLOZAPINE 100 MG PO TABS: ORAL_TABLET | ORAL | 11 refills | Status: DC

## 2020-07-14 NOTE — Progress Notes (Signed)
Nancy Brooks [id number] 11/01/1950 70 y.o.  Subjective:   Patient ID:  Nancy Brooks is a 70 y.o. (DOB 10-Nov-1950) female.  Chief Complaint:  Chief Complaint  Patient presents with  . Paranoid schizophrenia ([place])  . Follow-up    HPI  Nancy Brooks presentse today for follow-up of schizophrenia with severe negative symptoms.  09/11/2019 the following is noted: Had problems with standing order for unclear reason.  Tried to address that problem. Brought pictures she'd drawin in HS. Still experiences noises in the house that frighten her.  Back to [place] and [employer]. No meds were changed.  11/26/2019 appointment with the following noted: Sx continue as noted before.  Still doing crafts and brought examples.  Enjoys this. Still has repetitive motion rituals and touching rituals.   More constipation and diarrhea lately. Nancy Brooks wanted her to mention more trouble going to sleep.  Probably 6-7 hours of sleep. Checks weight once daily.   New decision to maintain weight at 110#.   02/25/20 appt with the following noted: Still making crafts.  Brought some to show MD. Still has episodes of hearing people outside her house causing anxiety.  Didn't get up right away but when she did they had left.  No knocks on the door. Unusual experiences driving car but no accidents.  If has flat tire or car problem then calls Nancy Brooks for help. Has worried she blasphemed the [facility] lately and has heard condemning weights.  Does not read much.  Watches some TV.  Pt reports that mood is Anxious and describes anxiety as Moderate. Anxiety symptoms include: excessive and obsessive worry.  Paranoia. . Chronic intrusive dysphoric thoughts usually of sexual nature or that she's going to hell.  Pt reports some sleep issues. Pt reports that appetite is good. Pt reports that energy is no change and good. Concentration is good. Suicidal thoughts:  denied by patient.  Does not have a lot of interests  chronically.  Anxious around people.  Chronic AH makes her anxious.   Worries about "uncleanness".  Not unusually anxious at home.  Has worries today she's afraid to discuss over the phone.   Does her own grocery shopping.  Sister helps with bills and appts.. Talks to her daily.   Little other social contact.   Still goes to [employer] on Sundays.  B will call and do chores at times.  Admits she lies to herself at times.  Going to [employer] and [employer] more and [employer] 3 times weekly which Nancy Brooks directs. Had flat tire and didn't recognize it initially but then got help with it.  Car is 68 years old and will get another. Plan: Continue clozapine 500 mg daily. Librium 10 BID.  05/05/2020 appointment with the following noted: Sister sent note saying pt may have missed 2 weeks of clozapine.  Pt says that's not true.  Disc risk from this and sent in RX to  Pharmacy with lab. Problems with car.  Got a new car.  Sister helping her learn to drive it. Still intrusive, "crazy" thoughts and voices.    Plan no med changes   07/14/20 appt noted: Seen initially with Nancy Brooks.  August missed some clozapine for 2 weeks.Disc  Ways to deal with this.  Nancy Brooks helps her with the medication.  Pt doesn't remember the time of missing the meds in August. Still doing about the same.  Practice driving with new car to her.  Practice driving some. Still anxious and uncomfortable around  people and driving.  Chronic voices.  No other problems with the meds.   AH maybe not everyday and they still make her nervous.  Not markedly depressed.  I fret a lot and sometimes yells out in the house.  Don't get enough sleep.  Obsesses on weight.  Little napping.   GI doctor, Ellouise Newer, PA addressed bowel problems.  Taking Miralax and Linzess.  Compliant and no med problems. Nancy Brooks helps her with store shopping and gas station.     Multiple prior psych hospitalizations before starting clozapine.  Review of Systems:  Review of  Systems  Constitutional: Negative for appetite change and unexpected weight change.  Cardiovascular: Negative for chest pain and palpitations.  Gastrointestinal: Positive for diarrhea. Negative for constipation.  Musculoskeletal: Positive for arthralgias.       Foot pain and plans to see a doctor  Neurological: Negative for dizziness, tremors, weakness and headaches.  Psychiatric/Behavioral: Positive for behavioral problems, confusion, decreased concentration and hallucinations. Negative for agitation, dysphoric mood, self-injury, sleep disturbance and suicidal ideas. The patient is nervous/anxious. The patient is not hyperactive.     Medications: I have reviewed the patient's current medications.  Current Outpatient Medications  Medication Sig Dispense Refill  . calcium elemental as carbonate (BARIATRIC TUMS ULTRA) 400 MG chewable tablet Chew 1,000 mg by mouth daily.    . Cholecalciferol (VITAMIN D3) 1000 UNITS CAPS Take by mouth daily.    . famotidine (PEPCID) 10 MG tablet Take 10 mg by mouth daily.     [name] ibandronate (BONIVA) 150 MG tablet SEE NOTES 3 tablet 3  . linaclotide (LINZESS) 72 MCG capsule TAKE 1 CAPSULE(72 MCG) BY MOUTH DAILY 30 capsule 3  . polyethylene glycol (MIRALAX / GLYCOLAX) 17 g packet Take 17 g by mouth daily as needed.    . simvastatin (ZOCOR) 40 MG tablet TAKE 1 TABLET(40 MG) BY MOUTH DAILY AT 6 PM (Patient taking differently: 1 tab daily.) 90 tablet 3  . vitamin B-12 (CYANOCOBALAMIN) 1000 MCG tablet Take 1 tablet (1,000 mcg total) by mouth daily. 90 tablet 3  . aspirin 81 MG tablet Take 81 mg by mouth daily. (Patient not taking: Reported on 07/14/2020)    . chlordiazePOXIDE (LIBRIUM) 10 MG capsule Take 1 capsule (10 mg total) by mouth in the morning and at bedtime. 60 capsule 5  . cloZAPine (CLOZARIL) 100 MG tablet TAKE 1 TABLET BY MOUTH EVERY MORNING AND 4 TABLETS EVERY EVENING 140 tablet 11   No current facility-administered medications for this visit.     Medication Side Effects: None.  Unless GI.  Allergies: No Known Allergies  Past Medical History:  Diagnosis Date  . Anxiety   . BURSITIS, RIGHT KNEE   . Depression   . Dyskinesia of esophagus   . GAIT DISTURBANCE   . GERD   . HEMORRHOIDS, NOS   . HYPERLIPIDEMIA   . MENOPAUSAL SYNDROME   . Osteoporosis dx 03/2011   DEXA at gyn, started boniva in additon to Ca+D, changed to [place] 09/2012  . SCHIZOPHRENIA   . SYMPTOM, INCONTINENCE, URGE     Family History  Problem Relation Age of Onset  . Hypertension Mother   . Depression Mother   . Prostate cancer Father   . Colon cancer Brother 52  . Esophageal cancer Neg Hx   . Pancreatic cancer Neg Hx   . Rectal cancer Neg Hx   . Stomach cancer Neg Hx     Social History   Socioeconomic History  . Marital status:  Single    Spouse name: Not on file  . Number of children: Not on file  . Years of education: Not on file  . Highest education level: Not on file  Occupational History  . Not on file  Tobacco Use  . Smoking status: Never Smoker  . Smokeless tobacco: Never Used  Substance and Sexual Activity  . Alcohol use: No    Alcohol/week: 0.0 standard drinks  . Drug use: No  . Sexual activity: Not on file  Other Topics Concern  . Not on file  Social History Narrative   Lives alone in single family home. Sister provides support and come to visits. Pt drives and indep ADLs.    Social Determinants of Health   Financial Resource Strain: Not on file  Food Insecurity: Not on file  Transportation Needs: Not on file  Physical Activity: Not on file  Stress: Not on file  Social Connections: Not on file  Intimate Partner Violence: Not on file  sister Nancy Brooks 43 yo B Johnny 71 yo.  Past Medical History, Surgical history, Social history, and Family history were reviewed and updated as appropriate.   Please see review of systems for further details on the patient's review from today.   Objective:   Physical Exam:  There  were no vitals taken for this visit.  Physical Exam Constitutional:      General: She is not in acute distress.    Appearance: She is well-developed.  Musculoskeletal:        General: No deformity.  Neurological:     Mental Status: She is alert and oriented to person, place, and time.     Motor: Tremor present.     Coordination: Coordination normal.     Gait: Gait normal.     Comments: Stereotypic compulsive style of walking with some retracing steps.  Psychiatric:        Attention and Perception: Attention normal. She is attentive. She perceives auditory hallucinations. She does not perceive visual hallucinations.        Mood and Affect: Mood is anxious. Mood is not depressed. Affect is flat and inappropriate. Affect is not labile, blunt or angry.        Speech: Speech is not delayed or slurred.        Behavior: Behavior is not agitated. Behavior is cooperative.        Thought Content: Thought content is paranoid. Thought content does not include homicidal or suicidal ideation. Thought content does not include homicidal or suicidal plan.        Cognition and Memory: Cognition is impaired. She exhibits impaired recent memory.     Comments: Odd facial grimacing with intrusive thoughts.  Chronic stereotypic and repetitive gestures especially when walking.  Chronically anxious and very poor social skills but not uncooperative. Rigid. 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 Poor insight and judgment fair. Obsesses on weight.    Affect chronically distressed.Occ AH and VH  Reduced concentration.    Brought picture frames she dressed up with crafts to show.again   Lab Review:     Component Value Date/Time   NA 140 03/31/2020 1356   K 4.3 03/31/2020 1356   CL 103 03/31/2020 1356   CO2 31 03/31/2020 1356   GLUCOSE 88 03/31/2020 1356   BUN 18 03/31/2020 1356   CREATININE 0.82 03/31/2020 1356   CALCIUM 9.5 03/31/2020 1356   PROT 6.5 03/31/2020 1356   ALBUMIN 4.3  03/31/2020 1356   AST 19 03/31/2020  1356   ALT 21 03/31/2020 1356   ALKPHOS 119 (H) 03/31/2020 1356   BILITOT 0.4 03/31/2020 1356       Component Value Date/Time   WBC 5.6 07/02/2020 1044   WBC 5.5 03/12/2019 1455   RBC 4.42 07/02/2020 1044   RBC 4.47 03/12/2019 1455   HGB 12.9 07/02/2020 1044   HCT 38.1 07/02/2020 1044   PLT 236 07/02/2020 1044   MCV 86 07/02/2020 1044   MCH 29.2 07/02/2020 1044   MCHC 33.9 07/02/2020 1044   MCHC 33.2 03/12/2019 1455   RDW 14.0 07/02/2020 1044   LYMPHSABS 1.2 07/02/2020 1044   MONOABS 0.3 08/11/2014 1243   EOSABS 0.0 07/02/2020 1044   BASOSABS 0.0 07/02/2020 1044   ANC count has been stable. No results found for: POCLITH, LITHIUM   No results found for: PHENYTOIN, PHENOBARB, VALPROATE, CBMZ   .res Assessment: Plan:    Nancy Brooks was seen today for paranoid schizophrenia ([facility]) and follow-up.  Diagnoses and all orders for this visit:  Paranoid schizophrenia ([place]) -     cloZAPine (CLOZARIL) 100 MG tablet; TAKE 1 TABLET BY MOUTH EVERY MORNING AND 4 TABLETS EVERY EVENING  Generalized anxiety disorder -     chlordiazePOXIDE (LIBRIUM) 10 MG capsule; Take 1 capsule (10 mg total) by mouth in the morning and at bedtime.  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  Greater than 50% of 30 min face to face time with patient was spent on counseling and coordination of care. We discussed her Chronic severe schizophrenic symptoms of paranoia, AH and negative symptoms.  She has not improved nor changed significantly since the last visit nor significantly since when she was first seen in a number of years ago.. Not able to function or self-care without the help of her sister.  Have adjusted the clozapine levels up and down without much difference in her overall symptoms.  She is currently taking clozapine 500 mg daily  as she did not improve at the higher dosages.   Chronically easily overwhelmed.   Needs help from sister. Needs instructions repeated.  She has chronic constipation and it's better lately.. Still takes Miralax. It's possible clozapine contributes to the problem.  Rec talk with PCP about any worsening constipation and diarrhea.  ANC has been stable for clozapine RX.  Disc risk of aplastic anemia. Continue monthly CBC.Disc compliance with her medications.  She has been consistent as far as we are aware and sister agrees except as noted. Continue clozapine 500 mg daily. Librium 10 BID.  We discussed the short-term risks associated with benzodiazepines including sedation and increased fall risk among others.  Discussed long-term side effect risk including dependence, potential withdrawal symptoms, and the potential eventual dose-related risk of dementia. She appears to benefit from the librium.   No med changes are likely to help.  Therfore not recommended but consider Luvox  Needs a lot of support and consistency bc of her anxiety and obsessions.  She seeks reassurance.  She feels chronically fragile.  She has no social support except for her family.  Disc ways to deal with condemning voices.  Consider fluvoxamine trial again for obsessive anxiety.  Will need to watch DDI with clozapine.  Defer.  Therefore FU every 10 weeks  Lynder Parents, MD, DFAPA   Please see After Visit Summary for patient specific instructions.  No future appointments.  No orders  of the defined types were placed in this encounter.     -------------------------------

## 2020-07-27 ENCOUNTER — Telehealth: Payer: Self-pay | Admitting: Psychiatry

## 2020-07-27 ENCOUNTER — Other Ambulatory Visit: Payer: Self-pay | Admitting: Psychiatry

## 2020-07-27 DIAGNOSIS — F200 Paranoid schizophrenia: Secondary | ICD-10-CM

## 2020-07-27 DIAGNOSIS — F411 Generalized anxiety disorder: Secondary | ICD-10-CM

## 2020-07-27 MED ORDER — CLOZAPINE 100 MG PO TABS: ORAL_TABLET | ORAL | 11 refills | Status: DC

## 2020-07-27 MED ORDER — CHLORDIAZEPOXIDE HCL 10 MG PO CAPS: 10.0000 mg | ORAL_CAPSULE | Freq: Two times a day (BID) | ORAL | 5 refills | Status: DC

## 2020-07-27 NOTE — Telephone Encounter (Signed)
noted 

## 2020-07-27 NOTE — Telephone Encounter (Signed)
I change the pharmacy in epic and sent refills to the new pharmacy.

## 2020-07-27 NOTE — Telephone Encounter (Signed)
reviewed

## 2020-07-27 NOTE — Telephone Encounter (Signed)
Faithann's sister, [name] called to inform her new pharmacy. It is [facility], [address] [place], [place], [place] [address] and phone number is [phone]

## 2020-07-28 ENCOUNTER — Other Ambulatory Visit: Payer: Self-pay | Admitting: Psychiatry

## 2020-07-29 LAB — CBC WITH DIFFERENTIAL/PLATELET
Basophils Absolute: 0 10*3/uL (ref 0.0–0.2)
Basos: 0 %
EOS (ABSOLUTE): 0 10*3/uL (ref 0.0–0.4)
Eos: 0 %
Hematocrit: 39.5 % (ref 34.0–46.6)
Hemoglobin: 13 g/dL (ref 11.1–15.9)
Immature Grans (Abs): 0 10*3/uL (ref 0.0–0.1)
Immature Granulocytes: 1 %
Lymphocytes Absolute: 1.2 10*3/uL (ref 0.7–3.1)
Lymphs: 18 %
MCH: 28.7 pg (ref 26.6–33.0)
MCHC: 32.9 g/dL (ref 31.5–35.7)
MCV: 87 fL (ref 79–97)
Monocytes Absolute: 0.3 10*3/uL (ref 0.1–0.9)
Monocytes: 5 %
Neutrophils Absolute: 5.1 10*3/uL (ref 1.4–7.0)
Neutrophils: 76 %
Platelets: 234 10*3/uL (ref 150–450)
RBC: 4.53 x10E6/uL (ref 3.77–5.28)
RDW: 13.9 % (ref 11.7–15.4)
WBC: 6.6 10*3/uL (ref 3.4–10.8)

## 2020-07-30 ENCOUNTER — Encounter: Payer: Self-pay | Admitting: Psychiatry

## 2020-08-19 ENCOUNTER — Ambulatory Visit: Payer: Medicare Other | Attending: Internal Medicine

## 2020-08-19 DIAGNOSIS — Z23 Encounter for immunization: Secondary | ICD-10-CM

## 2020-08-19 NOTE — Progress Notes (Signed)
   Covid-19 Vaccination Clinic  Name:  Nancy Brooks    MRN: [id number] DOB: [birth year]  08/19/2020  [name] was observed post Covid-19 immunization for 15 minutes without incident. She was provided with Vaccine Information Sheet and instruction to access the V-Safe system.   [name] was instructed to call 911 with any severe reactions post vaccine: [name] Difficulty breathing  . Swelling of face and throat  . A fast heartbeat  . A bad rash all over body  . Dizziness and weakness   Immunizations Administered    Name Date Dose VIS Date Route   Moderna Covid-19 Booster Vaccine 08/19/2020 12:06 PM 0.25 mL 02/26/2020 Intramuscular   Manufacturer: Moderna   Lot: [id number]   [place]: [phone]

## 2020-08-20 ENCOUNTER — Other Ambulatory Visit: Payer: Self-pay | Admitting: Psychiatry

## 2020-08-20 ENCOUNTER — Telehealth: Payer: Self-pay | Admitting: Psychiatry

## 2020-08-20 DIAGNOSIS — F200 Paranoid schizophrenia: Secondary | ICD-10-CM

## 2020-08-20 DIAGNOSIS — Z79899 Other long term (current) drug therapy: Secondary | ICD-10-CM

## 2020-08-20 NOTE — Telephone Encounter (Signed)
Order the lab.  Please print and fax it

## 2020-08-20 NOTE — Telephone Encounter (Signed)
Adaleigh called in today requesting a new lab order form as her is about to expire. She states that she would like a paper copy faxed to her because [facility] requires a paper copy. Ph [phone]. Fax [phone]

## 2020-08-25 ENCOUNTER — Other Ambulatory Visit (HOSPITAL_COMMUNITY): Payer: Self-pay

## 2020-08-25 ENCOUNTER — Other Ambulatory Visit: Payer: Self-pay | Admitting: Psychiatry

## 2020-08-25 MED ORDER — COVID-19 MRNA VACC (MODERNA) 100 MCG/0.5ML IM SUSP
INTRAMUSCULAR | 0 refills | Status: DC
  Filled 2020-08-25: qty 0.5, 1d supply, fill #0

## 2020-08-26 ENCOUNTER — Other Ambulatory Visit (HOSPITAL_COMMUNITY): Payer: Self-pay

## 2020-08-26 LAB — CBC WITH DIFFERENTIAL/PLATELET
Basophils Absolute: 0 10*3/uL (ref 0.0–0.2)
Basos: 0 %
EOS (ABSOLUTE): 0 10*3/uL (ref 0.0–0.4)
Eos: 0 %
Hematocrit: 40.7 % (ref 34.0–46.6)
Hemoglobin: 13.4 g/dL (ref 11.1–15.9)
Immature Grans (Abs): 0 10*3/uL (ref 0.0–0.1)
Immature Granulocytes: 0 %
Lymphocytes Absolute: 0.9 10*3/uL (ref 0.7–3.1)
Lymphs: 18 %
MCH: 28.5 pg (ref 26.6–33.0)
MCHC: 32.9 g/dL (ref 31.5–35.7)
MCV: 87 fL (ref 79–97)
Monocytes Absolute: 0.4 10*3/uL (ref 0.1–0.9)
Monocytes: 8 %
Neutrophils Absolute: 3.8 10*3/uL (ref 1.4–7.0)
Neutrophils: 74 %
Platelets: 250 10*3/uL (ref 150–450)
RBC: 4.7 x10E6/uL (ref 3.77–5.28)
RDW: 13.2 % (ref 11.7–15.4)
WBC: 5.1 10*3/uL (ref 3.4–10.8)

## 2020-09-14 ENCOUNTER — Other Ambulatory Visit: Payer: Self-pay | Admitting: Psychiatry

## 2020-09-14 DIAGNOSIS — Z79899 Other long term (current) drug therapy: Secondary | ICD-10-CM

## 2020-09-14 DIAGNOSIS — F200 Paranoid schizophrenia: Secondary | ICD-10-CM

## 2020-09-15 ENCOUNTER — Other Ambulatory Visit: Payer: Self-pay

## 2020-09-15 ENCOUNTER — Telehealth: Payer: Self-pay | Admitting: Physician Assistant

## 2020-09-15 MED ORDER — LINACLOTIDE 72 MCG PO CAPS: ORAL_CAPSULE | ORAL | 3 refills | Status: DC

## 2020-09-15 NOTE — Telephone Encounter (Signed)
Inbound call from patient stating she is completely out of Linzess medication and is requesting additional refills be sent to [facility] on [address] please.

## 2020-09-15 NOTE — Telephone Encounter (Signed)
Returned patients call about her Linzess refill. Patient has not been seen in the office in over a year. I made an appointment for patient to come into office 10/15/20 and sent in a refill to last her until her appointment.

## 2020-09-17 ENCOUNTER — Other Ambulatory Visit: Payer: Self-pay

## 2020-09-17 MED ORDER — LINACLOTIDE 72 MCG PO CAPS: ORAL_CAPSULE | ORAL | 3 refills | Status: DC

## 2020-09-22 ENCOUNTER — Ambulatory Visit (INDEPENDENT_AMBULATORY_CARE_PROVIDER_SITE_OTHER): Payer: Medicare Other | Admitting: Psychiatry

## 2020-09-22 ENCOUNTER — Other Ambulatory Visit: Payer: Self-pay

## 2020-09-22 ENCOUNTER — Encounter: Payer: Self-pay | Admitting: Psychiatry

## 2020-09-22 DIAGNOSIS — Z79899 Other long term (current) drug therapy: Secondary | ICD-10-CM | POA: Diagnosis not present

## 2020-09-22 DIAGNOSIS — F200 Paranoid schizophrenia: Secondary | ICD-10-CM | POA: Diagnosis not present

## 2020-09-22 DIAGNOSIS — F411 Generalized anxiety disorder: Secondary | ICD-10-CM

## 2020-09-22 DIAGNOSIS — K5903 Drug induced constipation: Secondary | ICD-10-CM | POA: Diagnosis not present
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
## 2020-09-22 NOTE — Patient Instructions (Addendum)
Can take Librium in morning and afternoon if preferred over morning and night.  Either is OK and do whichever is better for anxiety and sleep

## 2020-09-23 ENCOUNTER — Encounter: Payer: Self-pay | Admitting: Psychiatry

## 2020-09-27 ENCOUNTER — Other Ambulatory Visit: Payer: Self-pay | Admitting: Internal Medicine

## 2020-10-15 ENCOUNTER — Ambulatory Visit: Payer: Medicare Other | Admitting: Physician Assistant

## 2020-10-21 ENCOUNTER — Encounter: Payer: Self-pay | Admitting: Psychiatry

## 2020-11-12 ENCOUNTER — Encounter: Payer: Self-pay | Admitting: Physician Assistant

## 2020-11-12 ENCOUNTER — Ambulatory Visit: Payer: Medicare Other | Admitting: Physician Assistant

## 2020-11-12 VITALS — BP 110/70 | HR 100 | Ht 61.0 in | Wt 122.6 lb

## 2020-11-12 DIAGNOSIS — Z86010 Personal history of colonic polyps: Secondary | ICD-10-CM

## 2020-11-12 DIAGNOSIS — K5909 Other constipation: Secondary | ICD-10-CM

## 2020-11-12 MED ORDER — LINACLOTIDE 72 MCG PO CAPS: ORAL_CAPSULE | ORAL | 3 refills | Status: DC

## 2020-11-12 MED ORDER — SUTAB 1479-225-188 MG PO TABS: 1.0000 | ORAL_TABLET | ORAL | 0 refills | Status: DC

## 2020-11-12 NOTE — Patient Instructions (Addendum)
If you are age 68 or older, your body mass index should be between 23-30. Your Body mass index is 23.17 kg/m. If this is out of the aforementioned range listed, please consider follow up with your Primary Care Provider. __________________________________________________________  The  GI providers would like to encourage you to use [facility] to communicate with providers for non-urgent requests or questions.  Due to long hold times on the telephone, sending your provider a message by [facility] may be a faster and more efficient way to get a response.  Please allow 48 business hours for a response.  Please remember that this is for non-urgent requests.   You have been scheduled for a colonoscopy. Please follow written instructions given to you at your visit today.  Please pick up your prep supplies at the pharmacy within the next 1-3 days. If you use inhalers (even only as needed), please bring them with you on the day of your procedure.  Purchase 238 gram bottle of Miralax ,1 bottle of Dulcolax laxative 5 mg  tablets from the laxative section of your drug store and a 32 oz. bottle of Gatorade (NO RED OR PURPLE)-all over the counter   2 DAYS BEFORE PROCEDURE           DATE:     02/02/2021        DAY:  Tuesday   In the morning, mix 7 capfuls (105 grams)of Miralax with 32 oz Gatorade and refrigerate.     Eat a regular diet until start of prep in the evening, then clear liquids only.   At 3:00 pm take 2 Dulcolax tablets.   Between 5:00pm and 7:00 pm, Drink 8 oz. of Miralax mixture every 15 minutes until gone.    At 8:00 pm take 2 more Dulcolax tablets.  Due to recent changes in healthcare laws, you may see the results of your imaging and laboratory studies on MyChart before your provider has had a chance to review them.  We understand that in some cases there may be results that are confusing or concerning to you. Not all laboratory results come back in the same time frame and the provider may  be waiting for multiple results in order to interpret others.  Please give [place] 48 hours in order for your provider to thoroughly review all the results before contacting the office for clarification of your results.   We have sent refills to your pharmacy for your Linzess.  Thank you for entrusting me with your care and choosing [facility].  Ellouise Newer, PA-C

## 2020-11-12 NOTE — Progress Notes (Signed)
Chief Complaint: Refill Linzess and history of colon polyps  HPI:    Nancy Brooks is a 70 year old female with a past medical history as listed below, including constipation, known to Dr. Havery Moros, who presents clinic today for refill of her Linzess and history of colon polyp.      10/08/2015 colonoscopy with nonthrombosed external hemorrhoids, 1 8 mm polyp in the ascending colon, 1 6 mm polyp in the transverse colon, diverticulosis and nonbleeding internal hemorrhoids.  Non-thrombosed external hemorrhoids found on perianal exam.  Pathology revealed sessile serrated polyps and repeat was recommended in 5 years.     04/11/2019 patient seen in office by me and discussed constipation.  Started on Linzess 72 mcg daily.    05/29/2019 patient seen in clinic at that time was doing well with once daily dosing of MiraLAX in addition to Linzess 72 mcg daily.  At that time discussed that they could increase MiraLAX during times of constipation and continued Linzess 72 mcg if she continues to do fairly well on this.  Was noted that her next colonoscopy was due in June 2022.    Today, the patient is accompanied by her sister who again provides most of her history given her schizophrenia.  She explains that she does fairly well on Linzess 72 mcg daily and MiraLAX, but she is having difficulty titrating this.  Explains that if she takes MiraLAX every day she has diarrhea to the level of incontinence where she "messes up her bed sheets", if she uses it every other day then she deals with constipation still, this is regardless of taking her Linzess every day.  She discusses that she is not sure that when she is on an every other day schedule that she actually takes it appropriately.    Denies fever, chills, blood in her stool to her knowledge or symptoms that awaken her from sleep.  Past Medical History:  Diagnosis Date   Anxiety    BURSITIS, RIGHT KNEE    Depression    Dyskinesia of esophagus    GAIT DISTURBANCE     GERD    HEMORRHOIDS, NOS    HYPERLIPIDEMIA    MENOPAUSAL SYNDROME    Osteoporosis dx 03/2011   DEXA at gyn, started boniva in additon to Ca+D, changed to Prolia 09/2012   SCHIZOPHRENIA    SYMPTOM, INCONTINENCE, URGE     Past Surgical History:  Procedure Laterality Date   BREAST SURGERY  05/09/1998   left breast biopsy   COLONOSCOPY  03-16-2010    Current Outpatient Medications  Medication Sig Dispense Refill   aspirin 81 MG tablet Take 81 mg by mouth daily.     calcium elemental as carbonate (BARIATRIC TUMS ULTRA) 400 MG chewable tablet Chew 1,000 mg by mouth daily.     chlordiazePOXIDE (LIBRIUM) 10 MG capsule Take 1 capsule (10 mg total) by mouth in the morning and at bedtime. 60 capsule 5   Cholecalciferol (VITAMIN D3) 1000 UNITS CAPS Take by mouth daily.     cloZAPine (CLOZARIL) 100 MG tablet TAKE 1 TABLET BY MOUTH EVERY MORNING AND 4 TABLETS EVERY EVENING 140 tablet 11   COVID-19 mRNA vaccine, Moderna, 100 MCG/0.5ML injection Inject into the muscle. 0.5 mL 0   famotidine (PEPCID) 10 MG tablet Take 10 mg by mouth daily.      ibandronate (BONIVA) 150 MG tablet SEE NOTES 3 tablet 3   linaclotide (LINZESS) 72 MCG capsule TAKE 1 CAPSULE(72 MCG) BY MOUTH DAILY 30 capsule 3   polyethylene  glycol (MIRALAX / GLYCOLAX) 17 g packet Take 17 g by mouth daily as needed.     simvastatin (ZOCOR) 40 MG tablet TAKE 1 TABLET(40 MG) BY MOUTH DAILY AT 6 PM 90 tablet 0   vitamin B-12 (CYANOCOBALAMIN) 1000 MCG tablet Take 1 tablet (1,000 mcg total) by mouth daily. 90 tablet 3   No current facility-administered medications for this visit.    Allergies as of 11/12/2020   (No Known Allergies)    Family History  Problem Relation Age of Onset   Hypertension Mother    Depression Mother    Prostate cancer Father    Colon cancer Brother 61   Esophageal cancer Neg Hx    Pancreatic cancer Neg Hx    Rectal cancer Neg Hx    Stomach cancer Neg Hx     Social History   Socioeconomic History    Marital status: Single    Spouse name: Not on file   Number of children: Not on file   Years of education: Not on file   Highest education level: Not on file  Occupational History   Not on file  Tobacco Use   Smoking status: Never   Smokeless tobacco: Never  Substance and Sexual Activity   Alcohol use: No    Alcohol/week: 0.0 standard drinks   Drug use: No   Sexual activity: Not on file  Other Topics Concern   Not on file  Social History Narrative   Lives alone in single family home. Sister provides support and come to visits. Pt drives and indep ADLs.    Social Determinants of Health   Financial Resource Strain: Not on file  Food Insecurity: Not on file  Transportation Needs: Not on file  Physical Activity: Not on file  Stress: Not on file  Social Connections: Not on file  Intimate Partner Violence: Not on file    Review of Systems:    Constitutional: No weight loss, fever or chills Cardiovascular: No chest pain  Respiratory: No SOB  Gastrointestinal: See HPI and otherwise negative   Physical Exam:  Vital signs: BP 110/70   Pulse 100   Ht 5\' 1"  (1.549 m)   Wt 122 lb 9.6 oz (55.6 kg)   BMI 23.17 kg/m    Constitutional:   Pleasant Caucasian female appears to be in NAD, Well developed, Well nourished, alert and cooperative Respiratory: Respirations even and unlabored. Lungs clear to auscultation bilaterally.   No wheezes, crackles, or rhonchi.  Cardiovascular: Normal S1, S2. No MRG. Regular rate and rhythm. No peripheral edema, cyanosis or pallor.  Gastrointestinal:  Soft, nondistended, nontender. No rebound or guarding. Normal bowel sounds. No appreciable masses or hepatomegaly. Rectal:  Not performed.  Psychiatric: Oriented to person, place and time.  Blank expression  RELEVANT LABS AND IMAGING: CBC    Component Value Date/Time   WBC 5.1 08/25/2020 1030   WBC 5.5 03/12/2019 1455   RBC 4.70 08/25/2020 1030   RBC 4.47 03/12/2019 1455   HGB 13.4 08/25/2020  1030   HCT 40.7 08/25/2020 1030   PLT 250 08/25/2020 1030   MCV 87 08/25/2020 1030   MCH 28.5 08/25/2020 1030   MCHC 32.9 08/25/2020 1030   MCHC 33.2 03/12/2019 1455   RDW 13.2 08/25/2020 1030   LYMPHSABS 0.9 08/25/2020 1030   MONOABS 0.3 08/11/2014 1243   EOSABS 0.0 08/25/2020 1030   BASOSABS 0.0 08/25/2020 1030    CMP     Component Value Date/Time   NA 140 03/31/2020 1356  K 4.3 03/31/2020 1356   CL 103 03/31/2020 1356   CO2 31 03/31/2020 1356   GLUCOSE 88 03/31/2020 1356   BUN 18 03/31/2020 1356   CREATININE 0.82 03/31/2020 1356   CALCIUM 9.5 03/31/2020 1356   PROT 6.5 03/31/2020 1356   ALBUMIN 4.3 03/31/2020 1356   AST 19 03/31/2020 1356   ALT 21 03/31/2020 1356   ALKPHOS 119 (H) 03/31/2020 1356   BILITOT 0.4 03/31/2020 1356    Assessment: 1.  Constipation: Typically controlled with Linzess 72 mcg daily and MiraLAX 2.  History of colon polyps: Last colonoscopy in 2017 with sessile serrated polyps and recommendations to repeat in 5 years  Plan: 1.  Refilled Linzess 72 mcg daily. 2.  Scheduled patient for a surveillance colonoscopy with Dr. Havery Moros in the [facility].  Provided the patient with a detailed list of risks for the procedure and she agrees to proceed.  She will have a 2-day bowel prep given history of constipation. 3.  Discussed MiraLAX titration, explained that she can take half a dose of Lasix every day or even a quarter or teaspoon of MiraLAX daily, should try and find a dose of additional MiraLAX that she can take every day so she does not forget her doses. 4.  Patient to follow in clinic per recommendations from Dr. Havery Moros after time of procedure.  Ellouise Newer, PA-C [facility] 11/12/2020, 10:58 AM  Cc: Hoyt Koch, *

## 2020-11-12 NOTE — Progress Notes (Signed)
Agree with assessment and plan as outlined.  

## 2020-11-18 ENCOUNTER — Encounter: Payer: Self-pay | Admitting: Psychiatry

## 2020-11-30 ENCOUNTER — Other Ambulatory Visit: Payer: Self-pay

## 2020-11-30 ENCOUNTER — Ambulatory Visit (INDEPENDENT_AMBULATORY_CARE_PROVIDER_SITE_OTHER): Payer: Medicare Other

## 2020-11-30 ENCOUNTER — Encounter: Payer: Self-pay | Admitting: Internal Medicine

## 2020-11-30 ENCOUNTER — Ambulatory Visit: Payer: Medicare Other | Admitting: Internal Medicine

## 2020-11-30 DIAGNOSIS — S93325A Dislocation of tarsometatarsal joint of left foot, initial encounter: Secondary | ICD-10-CM | POA: Insufficient documentation

## 2020-11-30 DIAGNOSIS — S99922A Unspecified injury of left foot, initial encounter: Secondary | ICD-10-CM

## 2020-11-30 DIAGNOSIS — S99929A Unspecified injury of unspecified foot, initial encounter: Secondary | ICD-10-CM | POA: Insufficient documentation

## 2020-11-30 NOTE — Assessment & Plan Note (Addendum)
Pt is a poor historian.   L foot and L ankle X rays Ace wrap They will get a walker Blisters can be opened and 3 to 4 days Elevate leg No use Tylenol

## 2020-11-30 NOTE — Progress Notes (Signed)
Subjective:  Patient ID: Nancy Brooks, female    DOB: 04-Jun-1950  Age: 70 y.o. MRN: [id number]  CC: Foot Injury (Pt states she fell on yesterday and injured her foot, very swollen, and can't put any weight on it)   HPI Nancy Brooks presents for a fall at home on Sunday.  Apparently she was not able to bear weight on it and she was crawling on the floor which caused blistering of the top part of her foot.  She is here with [name] who provides the history  Outpatient Medications Prior to Visit  Medication Sig Dispense Refill   calcium elemental as carbonate (BARIATRIC TUMS ULTRA) 400 MG chewable tablet Chew 1,000 mg by mouth daily.     chlordiazePOXIDE (LIBRIUM) 10 MG capsule Take 1 capsule (10 mg total) by mouth in the morning and at bedtime. 60 capsule 5   Cholecalciferol (VITAMIN D3) 1000 UNITS CAPS Take by mouth daily.     cloZAPine (CLOZARIL) 100 MG tablet TAKE 1 TABLET BY MOUTH EVERY MORNING AND 4 TABLETS EVERY EVENING 140 tablet 11   COVID-19 mRNA vaccine, Moderna, 100 MCG/0.5ML injection Inject into the muscle. 0.5 mL 0   famotidine (PEPCID) 10 MG tablet Take 10 mg by mouth daily.      ibandronate (BONIVA) 150 MG tablet SEE NOTES 3 tablet 3   linaclotide (LINZESS) 72 MCG capsule TAKE 1 CAPSULE(72 MCG) BY MOUTH DAILY 90 capsule 3   polyethylene glycol (MIRALAX / GLYCOLAX) 17 g packet Take 17 g by mouth daily as needed.     simvastatin (ZOCOR) 40 MG tablet TAKE 1 TABLET(40 MG) BY MOUTH DAILY AT 6 PM 90 tablet 0   Sodium Sulfate-Mag Sulfate-KCl (SUTAB) 6268527358 MG TABS Take 1 kit by mouth as directed. 24 tablet 0   vitamin B-12 (CYANOCOBALAMIN) 1000 MCG tablet Take 1 tablet (1,000 mcg total) by mouth daily. 90 tablet 3   No facility-administered medications prior to visit.    ROS: Review of Systems  Constitutional:  Negative for activity change, appetite change, chills, fatigue and unexpected weight change.  HENT:  Negative for congestion, mouth sores and sinus pressure.    Eyes:  Negative for visual disturbance.  Respiratory:  Negative for cough and chest tightness.   Cardiovascular:  Positive for leg swelling.  Gastrointestinal:  Negative for abdominal pain and nausea.  Genitourinary:  Negative for difficulty urinating, frequency and vaginal pain.  Musculoskeletal:  Positive for gait problem. Negative for back pain.  Skin:  Positive for color change. Negative for pallor, rash and wound.  Neurological:  Negative for dizziness, tremors, weakness, numbness and headaches.  Psychiatric/Behavioral:  Positive for confusion. Negative for sleep disturbance.    Objective:  There were no vitals taken for this visit.  BP Readings from Last 3 Encounters:  11/12/20 110/70  03/31/20 124/68  05/29/19 106/60    Wt Readings from Last 3 Encounters:  11/12/20 122 lb 9.6 oz (55.6 kg)  03/31/20 118 lb 3.2 oz (53.6 kg)  05/29/19 118 lb (53.5 kg)    Physical Exam Constitutional:      General: She is not in acute distress.    Appearance: She is well-developed.  HENT:     Head: Normocephalic.     Right Ear: External ear normal.     Left Ear: External ear normal.     Nose: Nose normal.  Eyes:     General:        Right eye: No discharge.  Left eye: No discharge.     Conjunctiva/sclera: Conjunctivae normal.     Pupils: Pupils are equal, round, and reactive to light.  Neck:     Thyroid: No thyromegaly.     Vascular: No JVD.     Trachea: No tracheal deviation.  Cardiovascular:     Rate and Rhythm: Normal rate and regular rhythm.     Heart sounds: Normal heart sounds.  Pulmonary:     Effort: No respiratory distress.     Breath sounds: No stridor. No wheezing.  Abdominal:     General: Bowel sounds are normal. There is no distension.     Palpations: Abdomen is soft. There is no mass.     Tenderness: There is no abdominal tenderness. There is no guarding or rebound.  Musculoskeletal:        General: No tenderness.     Cervical back: Normal range of  motion and neck supple. No rigidity.  Lymphadenopathy:     Cervical: No cervical adenopathy.  Skin:    Findings: No erythema or rash.  Neurological:     Cranial Nerves: No cranial nerve deficit.     Motor: No abnormal muscle tone.     Coordination: Coordination normal.     Deep Tendon Reflexes: Reflexes normal.  Psychiatric:        Behavior: Behavior normal.   In a wheelchair   L foot w/pain swelling and bruising Blisters on the L dorsal foot from crawling on the floor, no open wounds The patient is alert and cooperative Blisters were covered with Band-Aids Ace wrap applied  Lab Results  Component Value Date   WBC 5.1 08/25/2020   HGB 13.4 08/25/2020   HCT 40.7 08/25/2020   PLT 250 08/25/2020   GLUCOSE 88 03/31/2020   CHOL 172 03/31/2020   TRIG 156.0 (H) 03/31/2020   HDL 63.60 03/31/2020   LDLDIRECT 84.9 12/22/2009   LDLCALC 77 03/31/2020   ALT 21 03/31/2020   AST 19 03/31/2020   NA 140 03/31/2020   K 4.3 03/31/2020   CL 103 03/31/2020   CREATININE 0.82 03/31/2020   BUN 18 03/31/2020   CO2 31 03/31/2020   TSH 2.98 03/12/2019   HGBA1C 5.6 03/31/2020    MM 3D SCREEN BREAST BILATERAL  Result Date: 04/17/2020 CLINICAL DATA:  Screening. EXAM: DIGITAL SCREENING BILATERAL MAMMOGRAM WITH TOMO AND CAD COMPARISON:  Previous exam(s). ACR Breast Density Category c: The breast tissue is heterogeneously dense, which may obscure small masses. FINDINGS: There are no findings suspicious for malignancy. Images were processed with CAD. IMPRESSION: No mammographic evidence of malignancy. A result letter of this screening mammogram will be mailed directly to the patient. RECOMMENDATION: Screening mammogram in one year. (Code:SM-B-01Y) BI-RADS CATEGORY  1: Negative. Electronically Signed   By: Audie Pinto M.D.   On: 04/17/2020 12:37    Assessment & Plan:    Walker Kehr, MD

## 2020-11-30 NOTE — Patient Instructions (Signed)
Foot Sprain A foot sprain is an injury to one of the ligaments in the feet. Ligaments are strong tissues that connect bones to each other. The ligament can be stretched too much. In some cases, it may tear. A tear can be either partial or complete. The severity of the sprain depends on how much of the ligament was damaged or torn. What are the causes? This condition is usually caused by suddenly twisting or pivoting your foot. What increases the risk? You are more likely to develop this condition if: You play a sport, such as basketball or football. You exercise or play a sport without first warming up your muscles. You start a new workout or sport. You suddenly increase how long or hard you exercise or play a sport. You have injured your foot or ankle before. What are the signs or symptoms? Symptoms of this condition start soon after an injury and include: Pain, especially in the arch of your foot. Bruising. Swelling. Being unable to walk or use your foot to support body weight. How is this diagnosed? This condition is diagnosed with a medical history and physical exam. You may also have imaging tests, such as: X-rays to check for broken bones (fractures). An MRI to see if the ligament is torn. How is this treated? Treatment for this condition depends on the severity of the sprain. Mild sprains and major sprains can be treated with: Rest, ice, pressure (compression), and elevation (RICE). Elevation means raising your injured foot. Keeping your foot in a fixed position (immobilization) for a period of time. This is done if your ligament is overstretched or partially torn. Your health care provider will apply a bandage, splint, or walking boot to keep your foot from moving until it heals. Using crutches or a scooter for a few weeks to avoid bearing weight on your foot while it is healing. Physical therapy exercises to improve movement and strength in your foot. Major sprains may also be  treated with: Surgery. This is done if your ligament is fully torn and a procedure is needed to reconnect it to the bone. A cast or splint. This will be needed after surgery. A cast or splint will need to stay on your foot while it heals. Follow these instructions at home: If you have a bandage, splint, or boot: Wear it as told by your health care provider. Remove it only as told by your health care provider. Loosen it if your toes tingle, become numb, or turn cold and blue. Keep it clean and dry. If you have a cast: Do not put pressure on any part of the cast until it is fully hardened. This may take several hours. Do not stick anything inside the cast to scratch your skin. Doing that increases your risk for infection. Check the skin around the cast every day. Tell your health care provider about any concerns. You may put lotion on dry skin around the edges of the cast. Do not put lotion on the skin underneath the cast. Keep it clean and dry. Bathing Do not take baths, swim, or use a hot tub until your health care provider approves. Ask your health care provider if you may take showers. You may only be allowed to take sponge baths. If the bandage, splint, boot, or cast is not waterproof: Do not let it get wet. Cover it with a watertight covering when you take a bath or shower. Managing pain, stiffness, and swelling  If directed, put ice on the injured   area. To do this: If you have a removable bandage, splint, or boot, remove it as told by your health care provider. Put ice in a plastic bag. Place a towel between your skin and the bag, or between your cast and the bag. Leave the ice on for 20 minutes, 2-3 times per day. Remove the ice if your skin turns bright red. This is very important. If you cannot feel pain, heat, or cold, you have a greater risk of damage to the area. Move your toes often to reduce stiffness and swelling. Elevate the injured area above the level of your heart while  you are sitting or lying down. Activity Do not use the injured foot to support your body weight until your health care provider says that you can. Use crutches or a scooter as told by your health care provider. Ask your health care provider what activities are safe for you. Do exercises as told by your health care provider. Gradually increase how much and how far you walk until your health care provider says it is safe to return to full activity. Driving Ask your health care provider if the medicine prescribed to you requires you to avoid driving or using machinery. Ask your health care provider when it is safe to drive if you have a bandage, splint, boot, or cast on your foot. General instructions Take over-the-counter and prescription medicines only as told by your health care provider. When you can walk without pain, wear supportive shoes that have stiff soles. Do not wear flip-flops. Do not walk barefoot. Keep all follow-up visits. This is important. Contact a health care provider if: Medicine does not help your pain. Your bruising or swelling gets worse or does not get better with treatment. Your splint, boot, or cast is damaged. Get help right away if: You develop severe numbness or tingling in your foot. Your foot turns blue, white, or gray, and it feels cold. Summary A foot sprain is an injury to one of the ligaments in the feet. Ligaments are strong tissues that connect bones to each other. You may need a bandage, splint, boot, or cast to support your foot while it heals. Sometimes, surgery may be needed. You may need physical therapy exercises to improve movement and strength in your foot. This information is not intended to replace advice given to you by your health care provider. Make sure you discuss any questions you have with your health care provider. Document Revised: 08/16/2019 Document Reviewed: 08/16/2019 Elsevier Patient Education  2022 [employer]  

## 2020-12-02 ENCOUNTER — Telehealth: Payer: Self-pay

## 2020-12-02 ENCOUNTER — Other Ambulatory Visit: Payer: Self-pay | Admitting: Internal Medicine

## 2020-12-02 ENCOUNTER — Telehealth: Payer: Self-pay | Admitting: Internal Medicine

## 2020-12-02 DIAGNOSIS — S93325A Dislocation of tarsometatarsal joint of left foot, initial encounter: Secondary | ICD-10-CM

## 2020-12-02 NOTE — Telephone Encounter (Signed)
See below

## 2020-12-02 NOTE — Telephone Encounter (Signed)
pts sister has asked that she be contacted about what are the next steps for the pt as she does not have a boot or anything. Pts sister is also asking that the X-Ray report be sent to pts MyChart as she is wanting to know these results. She states the nurse called her this morning but missed the call.  **Please contact pts sister who is her caregiver [name] at [phone].

## 2020-12-02 NOTE — Telephone Encounter (Signed)
    Patient calling for xray results from 7/25

## 2020-12-02 NOTE — Telephone Encounter (Signed)
Lisfranc fracture of the midfoot with medial dislocation of the first metatarsal and lateral displacement of the second through fourth metatarsals. Further evaluation with CT is recommended.

## 2020-12-03 ENCOUNTER — Telehealth: Payer: Self-pay

## 2020-12-03 NOTE — Telephone Encounter (Signed)
Maria - pls see below --  Dear [name], I am sorry about the delay in our communication.  The x-ray report became available today.  Nancy Brooks has a complex left midfoot fracture called Lisfranc fracture.  I will refer her to see a foot specialist hopefully this week. Sincerely, AP

## 2020-12-03 NOTE — Telephone Encounter (Signed)
  Written by Cassandria Anger, MD on 12/02/2020 10:58 PM EDT Seen by patient Nancy Brooks on 12/03/2020  1:46 PM

## 2020-12-03 NOTE — Telephone Encounter (Signed)
Garnette Czech,  It was addressed.  Thanks----  Dear [name], I am sorry about the delay in our communication.  The x-ray report became available today.  Nancy Brooks has a complex left midfoot fracture called Lisfranc fracture.  I will refer her to see a foot specialist hopefully this week. Sincerely, AP

## 2020-12-03 NOTE — Telephone Encounter (Signed)
pts sister has called asking about referral and which location is the pt going to be going to.  **Please call pt once information has been obtained.

## 2020-12-04 ENCOUNTER — Telehealth: Payer: Self-pay | Admitting: Podiatry

## 2020-12-04 NOTE — Telephone Encounter (Signed)
I scheduled this referral to see you Monday at 4:30 for a Lisfranc fracture. Her sister said that the PCP said she may need a CT scan. Do you need her to get a CT scan first before seeing you? If so, will you put that order in or should I refer back to her PCP to do that?

## 2020-12-07 ENCOUNTER — Other Ambulatory Visit: Payer: Self-pay

## 2020-12-07 ENCOUNTER — Ambulatory Visit: Payer: Medicare Other | Admitting: Podiatry

## 2020-12-07 ENCOUNTER — Encounter: Payer: Self-pay | Admitting: Podiatry

## 2020-12-07 ENCOUNTER — Ambulatory Visit: Payer: Medicare Other

## 2020-12-07 DIAGNOSIS — R600 Localized edema: Secondary | ICD-10-CM | POA: Diagnosis not present

## 2020-12-07 DIAGNOSIS — S93325A Dislocation of tarsometatarsal joint of left foot, initial encounter: Secondary | ICD-10-CM

## 2020-12-07 DIAGNOSIS — S92302D Fracture of unspecified metatarsal bone(s), left foot, subsequent encounter for fracture with routine healing: Secondary | ICD-10-CM | POA: Diagnosis not present

## 2020-12-08 ENCOUNTER — Ambulatory Visit
Admission: RE | Admit: 2020-12-08 | Discharge: 2020-12-08 | Disposition: A | Discharge status: Home / Self Care | Payer: Medicare Other | Source: Ambulatory Visit | Attending: Podiatry | Admitting: Podiatry

## 2020-12-08 ENCOUNTER — Telehealth: Payer: Self-pay | Admitting: Urology

## 2020-12-08 DIAGNOSIS — S93325A Dislocation of tarsometatarsal joint of left foot, initial encounter: Secondary | ICD-10-CM

## 2020-12-08 MED ORDER — CEPHALEXIN 500 MG PO CAPS: 500.0000 mg | ORAL_CAPSULE | Freq: Three times a day (TID) | ORAL | 0 refills | Status: DC

## 2020-12-08 NOTE — Addendum Note (Signed)
Addended bySherryle Lis, Samaiya Awadallah R on: 12/08/2020 12:41 PM   Modules accepted: Orders

## 2020-12-08 NOTE — Telephone Encounter (Signed)
DOS - 12/11/20  OPEN TREATMENT OF TARSOMETATARSAL JOINT DISLOCATION LEFT --- [id number] APPLICATION OF PINS EXTERNAL FIXATION LEFT --- 20690   [facility] EFFECTIVE DATE - 05/09/20   PLAN DEDUCTIBLE - $0.00 OUT OF POCKET - $2,400.00 W/ $2,300.00 REMAINING COINSURANCE - 0% COPAY - $100.00   PER [facility] SITE CPT CODES 24401 AND 02725 Notification or Prior Authorization is not required for the requested services  Decision ID [id number]

## 2020-12-08 NOTE — Progress Notes (Signed)
Subjective:  Patient ID: Nancy Brooks, female    DOB: [birth year],  MRN: [id number]  Chief Complaint  Patient presents with   Foot Injury    Patient fell on 11/29/2020 and twisted foot, swollen, blistered area, abrasions across the top and medial side, went to [facility], said lisfranc's injury, keeping wounds covered and was advised to stay non-weight bearing-using wheelchair   New Patient (Initial Visit)    70 y.o. female presents with the above complaint. History confirmed with patient.  She is referred to me by Dr. Paulla Dolly Nancy Brooks is present and provides some of the history as well.  She fell at home on 11/29/2020 and injured Nancy left foot she said it pulled up under Nancy leg.  They went to their primary care doctor who sent Nancy for x-rays and then referred to our office.  The foot is been very painful and very swollen she has been trying to elevate and ice it.  Objective:  Physical Exam: warm, good capillary refill, no trophic changes or ulcerative lesions, normal DP and PT pulses, and normal sensory exam. Left Foot: Significantly swollen ecchymotic forefoot superficial abrasion on medial foot over the TMT, very painful to palpation, light touch sensation is intact   Study Result  Narrative & Impression  CLINICAL DATA:  Left lateral ankle and foot pain status post fall yesterday.   EXAM: LEFT FOOT - 2 VIEW; LEFT ANKLE COMPLETE - 3+ VIEW   COMPARISON:  None.   FINDINGS: Left ankle: Mild soft tissue swelling overlying the lateral malleolus without underlying acute fracture or dislocation.   Left foot: There is medial dislocation of the first metatarsal relative to the medial cuneiform. There is lateral displacement of the second through fourth metatarsals. There are multiple small fracture fragments seen at the first metatarsophalangeal joint. There is misalignment of the medial and mid cuneiform. Moderate soft tissue swelling seen in the dorsal midfoot.    IMPRESSION: Lisfranc fracture of the midfoot with medial dislocation of the first metatarsal and lateral displacement of the second through fourth metatarsals. Further evaluation with CT is recommended.   These results will be called to the ordering clinician or representative by the Radiologist Assistant, and communication documented in the PACS or [employer].     Electronically Signed   By: Miachel Roux M.D.   On: 12/02/2020 11:39   Assessment:   1. Dislocation of tarsometatarsal joint of left foot, initial encounter   2. Closed fracture of base of metatarsal bone with routine healing, left   3. Edema of left lower extremity      Plan:  Patient was evaluated and treated and all questions answered.  I reviewed the radiographic findings with the patient and Nancy Brooks.  Discussed that this fracture is unstable and requires operative intervention with reduction and eventual internal fixation.  Given Nancy amount of soft tissue swelling in the small medial wound I recommend close reduction with percutaneous pinning and possible external fixation application this week.  She will need tarsometatarsal arthrodesis in approximately 4 to 5 weeks once Nancy soft tissues have healed appropriately.  Multiple orders placed including preoperative physical therapy for nonweightbearing Nancy Brooks says this will be quite difficult for Nancy currently she is helping Nancy and staying with Nancy; Keflex prescription as a precaution considering the medial wound (note medial wound does not penetrate below the dermis); CT scan of the left foot for preoperative planning as well as a rolling knee scooter.  Informed consent was signed and  reviewed we discussed the risks and benefits and potential complications of surgical intervention including but not limited to pain, swelling, infection, scar, numbness which may be temporary or permanent, chronic pain, stiffness, nerve pain or damage, wound healing problems, bone  healing problems including delayed or non-union.  They understand and will plan to proceed.  A sterile dressing with Iodosorb and nonadherent gauze was applied to the wound and then a multilayer layer Jones compression dressing with a posterior splint from fiberglass was applied as well.    No follow-ups on file.

## 2020-12-09 ENCOUNTER — Other Ambulatory Visit: Payer: Self-pay

## 2020-12-09 ENCOUNTER — Telehealth: Payer: Self-pay | Admitting: Podiatry

## 2020-12-09 ENCOUNTER — Ambulatory Visit: Payer: Medicare Other | Attending: Podiatry

## 2020-12-09 DIAGNOSIS — S93325A Dislocation of tarsometatarsal joint of left foot, initial encounter: Secondary | ICD-10-CM | POA: Insufficient documentation

## 2020-12-09 DIAGNOSIS — R262 Difficulty in walking, not elsewhere classified: Secondary | ICD-10-CM | POA: Insufficient documentation

## 2020-12-09 DIAGNOSIS — M6281 Muscle weakness (generalized): Secondary | ICD-10-CM | POA: Insufficient documentation

## 2020-12-09 DIAGNOSIS — R2689 Other abnormalities of gait and mobility: Secondary | ICD-10-CM | POA: Insufficient documentation

## 2020-12-09 NOTE — Telephone Encounter (Signed)
[facility] out patient therapy called and stated that Dr. Sherryle Lis sent over a referral for training with her knee scooter, she is not able to use the scooter by herself safely for a few weeks. Please call back ASAP because patient has sx this Friday.  Physical Therapist -Gar Ponto [phone]

## 2020-12-10 ENCOUNTER — Telehealth: Payer: Self-pay | Admitting: Psychiatry

## 2020-12-10 ENCOUNTER — Inpatient Hospital Stay (HOSPITAL_COMMUNITY)
Admission: AD | Admit: 2020-12-10 | Discharge: 2020-12-17 | DRG: 505 | Disposition: A | Discharge status: Skilled Nursing Facility | Payer: Medicare Other | Source: Ambulatory Visit | Attending: Internal Medicine | Admitting: Internal Medicine

## 2020-12-10 ENCOUNTER — Encounter (HOSPITAL_COMMUNITY): Payer: Self-pay | Admitting: Family Medicine

## 2020-12-10 DIAGNOSIS — F32A Depression, unspecified: Secondary | ICD-10-CM | POA: Diagnosis present

## 2020-12-10 DIAGNOSIS — Z79899 Other long term (current) drug therapy: Secondary | ICD-10-CM | POA: Diagnosis not present

## 2020-12-10 DIAGNOSIS — K5909 Other constipation: Secondary | ICD-10-CM | POA: Diagnosis present

## 2020-12-10 DIAGNOSIS — Z818 Family history of other mental and behavioral disorders: Secondary | ICD-10-CM

## 2020-12-10 DIAGNOSIS — X501XXA Overexertion from prolonged static or awkward postures, initial encounter: Secondary | ICD-10-CM

## 2020-12-10 DIAGNOSIS — K5900 Constipation, unspecified: Secondary | ICD-10-CM | POA: Diagnosis present

## 2020-12-10 DIAGNOSIS — S92902P Unspecified fracture of left foot, subsequent encounter for fracture with malunion: Secondary | ICD-10-CM | POA: Diagnosis not present

## 2020-12-10 DIAGNOSIS — F419 Anxiety disorder, unspecified: Secondary | ICD-10-CM | POA: Diagnosis present

## 2020-12-10 DIAGNOSIS — S93325A Dislocation of tarsometatarsal joint of left foot, initial encounter: Principal | ICD-10-CM | POA: Diagnosis present

## 2020-12-10 DIAGNOSIS — M810 Age-related osteoporosis without current pathological fracture: Secondary | ICD-10-CM | POA: Diagnosis present

## 2020-12-10 DIAGNOSIS — S92902A Unspecified fracture of left foot, initial encounter for closed fracture: Secondary | ICD-10-CM | POA: Diagnosis present

## 2020-12-10 DIAGNOSIS — S92312A Displaced fracture of first metatarsal bone, left foot, initial encounter for closed fracture: Secondary | ICD-10-CM | POA: Diagnosis present

## 2020-12-10 DIAGNOSIS — W19XXXA Unspecified fall, initial encounter: Secondary | ICD-10-CM | POA: Diagnosis present

## 2020-12-10 DIAGNOSIS — E785 Hyperlipidemia, unspecified: Secondary | ICD-10-CM | POA: Diagnosis present

## 2020-12-10 DIAGNOSIS — K5904 Chronic idiopathic constipation: Secondary | ICD-10-CM | POA: Diagnosis not present

## 2020-12-10 DIAGNOSIS — Y92009 Unspecified place in unspecified non-institutional (private) residence as the place of occurrence of the external cause: Secondary | ICD-10-CM

## 2020-12-10 DIAGNOSIS — F209 Schizophrenia, unspecified: Secondary | ICD-10-CM | POA: Diagnosis present

## 2020-12-10 DIAGNOSIS — R6883 Chills (without fever): Secondary | ICD-10-CM

## 2020-12-10 DIAGNOSIS — K219 Gastro-esophageal reflux disease without esophagitis: Secondary | ICD-10-CM | POA: Diagnosis present

## 2020-12-10 DIAGNOSIS — Z20822 Contact with and (suspected) exposure to covid-19: Secondary | ICD-10-CM | POA: Diagnosis present

## 2020-12-10 DIAGNOSIS — M79672 Pain in left foot: Secondary | ICD-10-CM | POA: Diagnosis present

## 2020-12-10 LAB — BASIC METABOLIC PANEL
Anion gap: 6 (ref 5–15)
BUN: 30 mg/dL — ABNORMAL HIGH (ref 8–23)
CO2: 30 mmol/L (ref 22–32)
Calcium: 9 mg/dL (ref 8.9–10.3)
Chloride: 106 mmol/L (ref 98–111)
Creatinine, Ser: 0.96 mg/dL (ref 0.44–1.00)
GFR, Estimated: >60 mL/min (ref >60)
Glucose, Bld: 161 mg/dL — ABNORMAL HIGH (ref 70–99)
Potassium: 4.1 mmol/L (ref 3.5–5.1)
Sodium: 142 mmol/L (ref 135–145)

## 2020-12-10 LAB — CBC WITH DIFFERENTIAL/PLATELET
Abs Immature Granulocytes: 0.04 10*3/uL (ref 0.00–0.07)
Basophils Absolute: 0 10*3/uL (ref 0.0–0.1)
Basophils Relative: 0 %
Eosinophils Absolute: 0 10*3/uL (ref 0.0–0.5)
Eosinophils Relative: 0 %
HCT: 36.2 % (ref 36.0–46.0)
Hemoglobin: 11.4 g/dL — ABNORMAL LOW (ref 12.0–15.0)
Immature Granulocytes: 1 %
Lymphocytes Relative: 13 %
Lymphs Abs: 0.9 10*3/uL (ref 0.7–4.0)
MCH: 28.1 pg (ref 26.0–34.0)
MCHC: 31.5 g/dL (ref 30.0–36.0)
MCV: 89.4 fL (ref 80.0–100.0)
Monocytes Absolute: 0.4 10*3/uL (ref 0.1–1.0)
Monocytes Relative: 6 %
Neutro Abs: 5.7 10*3/uL (ref 1.7–7.7)
Neutrophils Relative %: 80 %
Platelets: 245 10*3/uL (ref 150–400)
RBC: 4.05 MIL/uL (ref 3.87–5.11)
RDW: 14.6 % (ref 11.5–15.5)
WBC: 7 10*3/uL (ref 4.0–10.5)
nRBC: 0 % (ref 0.0–0.2)

## 2020-12-10 LAB — RESP PANEL BY RT-PCR (FLU A&B, COVID) ARPGX2
Influenza A by PCR: NEGATIVE
Influenza B by PCR: NEGATIVE
SARS Coronavirus 2 by RT PCR: NEGATIVE

## 2020-12-10 LAB — MAGNESIUM: Magnesium: 2.4 mg/dL (ref 1.7–2.4)

## 2020-12-10 MED ORDER — ONDANSETRON HCL 4 MG/2ML IJ SOLN: 4.0000 mg | Freq: Four times a day (QID) | INTRAMUSCULAR | Status: DC | PRN

## 2020-12-10 MED ORDER — ACETAMINOPHEN 650 MG RE SUPP: 650.0000 mg | Freq: Four times a day (QID) | RECTAL | Status: DC | PRN

## 2020-12-10 MED ORDER — CHLORDIAZEPOXIDE HCL 10 MG PO CAPS
10.0000 mg | ORAL_CAPSULE | Freq: Two times a day (BID) | ORAL | Status: DC
  Administered 2020-12-10 – 2020-12-17 (×14): 10 mg via ORAL
  Filled 2020-12-10 (×14): qty 1

## 2020-12-10 MED ORDER — ACETAMINOPHEN 325 MG PO TABS
650.0000 mg | ORAL_TABLET | Freq: Four times a day (QID) | ORAL | Status: DC | PRN
  Administered 2020-12-12 – 2020-12-16 (×3): 650 mg via ORAL
  Filled 2020-12-10 (×6): qty 2

## 2020-12-10 MED ORDER — HYDROCODONE-ACETAMINOPHEN 5-325 MG PO TABS
1.0000 | ORAL_TABLET | ORAL | Status: DC | PRN
  Administered 2020-12-10 – 2020-12-15 (×7): 1 via ORAL
  Administered 2020-12-15 – 2020-12-17 (×2): 2 via ORAL
  Filled 2020-12-10 (×3): qty 1
  Filled 2020-12-10: qty 2
  Filled 2020-12-10 (×4): qty 1
  Filled 2020-12-10: qty 2

## 2020-12-10 MED ORDER — FAMOTIDINE 20 MG PO TABS
10.0000 mg | ORAL_TABLET | Freq: Every day | ORAL | Status: DC
  Administered 2020-12-12 – 2020-12-17 (×6): 10 mg via ORAL
  Filled 2020-12-10 (×6): qty 1

## 2020-12-10 MED ORDER — CLOZAPINE 25 MG PO TABS
350.0000 mg | ORAL_TABLET | Freq: Once | ORAL | Status: AC
  Administered 2020-12-10: 350 mg via ORAL
  Filled 2020-12-10: qty 2

## 2020-12-10 MED ORDER — LINACLOTIDE 145 MCG PO CAPS
145.0000 ug | ORAL_CAPSULE | Freq: Every day | ORAL | Status: DC
  Administered 2020-12-12 – 2020-12-17 (×6): 145 ug via ORAL
  Filled 2020-12-10 (×7): qty 1

## 2020-12-10 MED ORDER — ENOXAPARIN SODIUM 40 MG/0.4ML IJ SOSY
40.0000 mg | PREFILLED_SYRINGE | INTRAMUSCULAR | Status: DC
  Administered 2020-12-11 – 2020-12-16 (×6): 40 mg via SUBCUTANEOUS
  Filled 2020-12-10 (×6): qty 0.4

## 2020-12-10 MED ORDER — ONDANSETRON HCL 4 MG PO TABS: 4.0000 mg | ORAL_TABLET | Freq: Four times a day (QID) | ORAL | Status: DC | PRN

## 2020-12-10 MED ORDER — POLYETHYLENE GLYCOL 3350 17 G PO PACK
17.0000 g | PACK | Freq: Every day | ORAL | Status: DC | PRN
  Administered 2020-12-15: 17 g via ORAL
  Filled 2020-12-10: qty 1

## 2020-12-10 MED ORDER — SODIUM CHLORIDE 0.9 % IV SOLN: INTRAVENOUS | Status: DC

## 2020-12-10 MED ORDER — SIMVASTATIN 40 MG PO TABS
40.0000 mg | ORAL_TABLET | Freq: Every day | ORAL | Status: DC
  Administered 2020-12-11 – 2020-12-16 (×6): 40 mg via ORAL
  Filled 2020-12-10 (×6): qty 1

## 2020-12-10 MED ORDER — CALCIUM CARBONATE ANTACID 500 MG PO CHEW
1000.0000 mg | CHEWABLE_TABLET | Freq: Every day | ORAL | Status: DC
  Administered 2020-12-12 – 2020-12-17 (×6): 1000 mg via ORAL
  Filled 2020-12-10 (×6): qty 5

## 2020-12-10 NOTE — Addendum Note (Signed)
Addended by: Gar Ponto on: 12/10/2020 09:47 AM   Modules accepted: Orders

## 2020-12-10 NOTE — Therapy (Addendum)
[place], [place], [address] Phone: [phone]   Fax:  [phone]  Physical Therapy Evaluation/Discharge  Patient Details  Name: Nancy Brooks MRN: [id number] Date of Birth: 1950/05/31 Referring Provider (PT): [name], [place]   Encounter Date: 12/09/2020   PT End of Session - 12/10/20 0906     Visit Number 1    Number of Visits 1    Authorization Type [employer] MEDICARE    PT Start Time 6962    PT Stop Time 1645    PT Time Calculation (min) 60 min    Equipment Utilized During Treatment Gait belt    Activity Tolerance Patient limited by fatigue    Behavior During Therapy Flat affect             Past Medical History:  Diagnosis Date   Anxiety    BURSITIS, RIGHT KNEE    Depression    Dyskinesia of esophagus    GAIT DISTURBANCE    GERD    HEMORRHOIDS, NOS    HYPERLIPIDEMIA    MENOPAUSAL SYNDROME    Osteoporosis dx 03/2011   DEXA at gyn, started boniva in additon to Ca+D, changed to Prolia 09/2012   SCHIZOPHRENIA    SYMPTOM, INCONTINENCE, URGE     Past Surgical History:  Procedure Laterality Date   BREAST SURGERY  05/09/1998   left breast biopsy   COLONOSCOPY  03-16-2010    There were no vitals filed for this visit.    Subjective Assessment - 12/10/20 0857     Subjective Pt report she is only experincing a little L ankle pain.    Patient Stated Goals To be able to get around on my own    Currently in Pain? Yes    Pain Score 2     Pain Location Ankle    Pain Orientation Left    Pain Descriptors / Indicators Aching    Pain Type Acute pain    Pain Onset 1 to 4 weeks ago    Pain Frequency Intermittent                [facility] PT Assessment - 12/10/20 0001       Assessment   Medical Diagnosis Dislocation of tarsometatarsal joint of left foot, initial encounter    Referring Provider (PT) [name], DPM    Onset Date/Surgical Date 11/29/20    Next MD Visit  12/11/20-surgery    Prior Therapy no      Precautions   Precautions Fall      Restrictions   Weight Bearing Restrictions Yes    LLE Weight Bearing Non weight bearing      Balance Screen   Has the patient fallen in the past 6 months Yes    How many times? 1   fall in whcich she sustained L ankle injury   Has the patient had a decrease in activity level because of a fear of falling?  Yes    Is the patient reluctant to leave their home because of a fear of falling?  Yes      [place] residence    Living Arrangements Alone    Type of [place] to enter    Entrance Stairs-Number of Steps 3    Entrance Stairs-Rails None    Home Layout One level    Home Equipment --   knee scooter     Prior  Function   Level of Independence Independent with basic ADLs    Vocation Retired      Associate Professor   Overall Cognitive Status Difficult to assess    Problem Solving Impaired      Observation/Other Assessments   Observations L ankle immobilized in posertior splint    Focus on Therapeutic Outcomes (FOTO)  NA      ROM / Strength   AROM / PROM / Strength AROM;Strength      AROM   Overall AROM Comments UEs and LEs WFLs except splinted L ankle      Strength   Overall Strength Comments grossly shoulders 3/5, elbows 4/5, grip 3/5; LEs: hips 3/5, knees 4/5 , ankles 3/5      Transfers   Transfers Sit to Stand;Stand to Sit    Sit to Stand 4: Min guard      Ambulation/Gait   Gait Comments Mod A for safety c knee scooter. Frequent breaks to rest, stopping every few feet. Mod A c RW for 2 hops NWB. Pt was not able maintain NWB after 2 hops. Pt was not found not to be safe Indly with either device.                        Objective measurements completed on examination: See above findings.               PT Education - 12/10/20 0905     Education Details Eval findings an recommendations: see Plan    Person(s)  Educated Patient;Other (comment)   Sister -Nancy Brooks   Methods Explanation;Demonstration;Tactile cues;Verbal cues    Comprehension Verbalized understanding;Returned demonstration;Verbal cues required;Tactile cues required              PT Short Term Goals - 12/10/20 0920       PT SHORT TERM GOAL #1   Title pt's sister, Nancy Brooks, will understand recomendations for safe mobility and future care following L ankle surgery    Status Achieved    Target Date 12/09/20               PT Long Term Goals - 12/10/20 0922       PT LONG TERM GOAL #1   Title NA                    Plan - 12/10/20 0738     Clinical Impression Statement Pt presents to PT with her sister, Nancy Brooks, for gait training for NWB LLE with knee scooter due to a Lisfranc injury and requiring surgery 12/11/20. With today's assessemnt, it was determined the pt was not able to safely utilize the knee scooter or a RW Indly. With mod A, pt was able to use the knee scooter, but ineffectively, requiring to rest every few steps. Additionally, pt has a very small, cluttered home in which the knee scooter would be difficult to maneuver. With mod A c the  RW, the pt was able to make a couple of steps, but then was not able to maintain the needed NWB status. Pt's sister notes, she is able to provide care over the next weekend after surgery, but following this time frame, the care she could provide would be intermittent. A wheelchair, which the pt could propel with her arms and R LE, appears to be a better option for safe mobility. Pt is able to transfer c SBA and should progress to Ind transfers. Pt's sister is considering a short term SNF/rehab  placement to help improve the pt's strength and safety for functional mobility before returning to the pt's home. Generally, the pt is weak with decreased strength of all extremities and endurance is limited. This PT contacted Dr. Roxy Manns office to provided a report of today's eval. The pt's  sister is going to contact his office re: assistance with the SNF/rehab placement after surgery.    Personal Factors and Comorbidities Fitness;Time since onset of injury/illness/exacerbation;Comorbidity 2    Comorbidities axiety, SCHIZOPHRENIA    Examination-Activity Limitations Locomotion Level;Stand;Transfers    Examination-Participation Restrictions Other   ADLs   Stability/Clinical Decision Making Evolving/Moderate complexity    Clinical Decision Making Moderate    Rehab Potential Good    PT Frequency --   eval only   PT Treatment/Interventions Patient/family education;Gait training;Functional mobility training    PT Next Visit Plan eval only    Recommended Other Services see above    Consulted and Agree with Plan of Care Patient;Family member/caregiver    Family Member Consulted Nancy Brooks-sister             Patient will benefit from skilled therapeutic intervention in order to improve the following deficits and impairments:  Difficulty walking, Abnormal gait, Decreased strength, Increased edema, Decreased balance, Pain, Decreased knowledge of precautions, Decreased safety awareness  Visit Diagnosis: Dislocation of tarsometatarsal joint of left foot, initial encounter  Muscle weakness (generalized)  Difficulty in walking, not elsewhere classified  Other abnormalities of gait and mobility     Problem List Patient Active Problem List   Diagnosis Date Noted   Foot injury 11/30/2020   Incontinence of feces 03/12/2019   B12 deficiency 12/01/2017   Routine general medical examination at a health care facility 05/22/2015   Senile osteoporosis 10/05/2011   Hyperlipidemia 01/20/2009   GERD 01/20/2009   Schizophrenia, unspecified type ([place]) 07/06/2006    Gar Ponto MS, PT 12/10/20 9:44 AM   [facility] [address] [place], [place], [address] Phone: [phone]   Fax:  [phone]  Name: Nancy Brooks MRN:  [id number] Date of Birth: 11-15-1950  PHYSICAL THERAPY DISCHARGE SUMMARY  Visits from Start of Care: 1, eval only  Current functional level related to goals / functional outcomes: See above   Remaining deficits: See above   Education / Equipment: Education  Patient agrees to discharge. Patient goals were met. Patient is being discharged due to meeting the stated rehab goals.   Turner Kunzman MS, PT 01/21/21 9:26 AM

## 2020-12-10 NOTE — H&P (Signed)
History and Physical    Nancy Brooks QAS:[id number] DOB: 1950/07/11 DOA: 12/10/2020  PCP: Hoyt Koch, MD  Patient coming from: Podiatry office  I have personally briefly reviewed patient's old medical records in [place]  Chief Complaint: Nonunion left foot fracture  HPI: Nancy Brooks is a 70 y.o. female with medical history significant of anxiety, depression, GERD, hyperlipidemia who is being directly admitted from podiatry office.  Reportedly, patient fell on 11/29/2020 and twisted her left foot which was swollen with a blistered area and abrasions across the top and medial side.  She went to see her PCP.  X-rays were done and she was diagnosed with Lisfranc's injury.  She was advised to stay nonweightbearing.  She was then seen by podiatry/Dr. Sherryle Lis 3 days ago and was diagnosed with dislocation of the tarsometatarsal joint of the left foot" fracture of the base of the metatarsal bone with routine healing.  She was recommended surgery however due to swelling, she was prescribed antibiotics.  Podiatrist follow-up with her and due to patient's inability to nonweightbearing, we decided to directly admit the patient and he plans to do surgical procedure tomorrow to fix the fracture.  Patient currently complains of left foot pain.  No other complaint.  She is fully alert and oriented.  Review of Systems: As per HPI otherwise negative.    Past Medical History:  Diagnosis Date   Anxiety    BURSITIS, RIGHT KNEE    Depression    Dyskinesia of esophagus    GAIT DISTURBANCE    GERD    HEMORRHOIDS, NOS    HYPERLIPIDEMIA    MENOPAUSAL SYNDROME    Osteoporosis dx 03/2011   DEXA at gyn, started boniva in additon to Ca+D, changed to Prolia 09/2012   SCHIZOPHRENIA    SYMPTOM, INCONTINENCE, URGE     Past Surgical History:  Procedure Laterality Date   BREAST SURGERY  05/09/1998   left breast biopsy   COLONOSCOPY  03-16-2010     reports that she has never smoked. She has  never used smokeless tobacco. She reports that she does not drink alcohol and does not use drugs.  No Known Allergies  Family History  Problem Relation Age of Onset   Hypertension Mother    Depression Mother    Prostate cancer Father    Colon cancer Brother 28   Esophageal cancer Neg Hx    Pancreatic cancer Neg Hx    Rectal cancer Neg Hx    Stomach cancer Neg Hx     Prior to Admission medications   Medication Sig Start Date End Date Taking? Authorizing Provider  calcium elemental as carbonate (BARIATRIC TUMS ULTRA) 400 MG chewable tablet Chew 1,000 mg by mouth daily.    [provider]  cephALEXin (KEFLEX) 500 MG capsule Take 1 capsule (500 mg total) by mouth 3 (three) times daily for 5 days. 12/08/20 12/13/20  McDonald, Stephan Minister, DPM  chlordiazePOXIDE (LIBRIUM) 10 MG capsule Take 1 capsule (10 mg total) by mouth in the morning and at bedtime. 07/27/20   Cottle, Billey Co., MD  Cholecalciferol (VITAMIN D3) 1000 UNITS CAPS Take by mouth daily.    [provider]  cloZAPine (CLOZARIL) 100 MG tablet TAKE 1 TABLET BY MOUTH EVERY MORNING AND 4 TABLETS EVERY EVENING 07/27/20   Cottle, Billey Co., MD  COVID-19 mRNA vaccine, Moderna, 100 MCG/0.5ML injection Inject into the muscle. 08/19/20   Carlyle Basques, MD  famotidine (PEPCID) 10 MG tablet Take 10  mg by mouth daily.     [provider]  ibandronate (BONIVA) 150 MG tablet SEE NOTES 12/03/18   Hoyt Koch, MD  linaclotide [facility]) 72 MCG capsule TAKE 1 CAPSULE(72 MCG) BY MOUTH DAILY 11/12/20   Levin Erp, PA  polyethylene glycol (MIRALAX / GLYCOLAX) 17 g packet Take 17 g by mouth daily as needed.    [provider]  simvastatin (ZOCOR) 40 MG tablet TAKE 1 TABLET(40 MG) BY MOUTH DAILY AT 6 PM 09/30/20   Hoyt Koch, MD  Sodium Sulfate-Mag Sulfate-KCl (SUTAB) [phone] MG TABS Take 1 kit by mouth as directed. 11/12/20   Levin Erp, PA  vitamin B-12 (CYANOCOBALAMIN) 1000 MCG  tablet Take 1 tablet (1,000 mcg total) by mouth daily. 08/31/17   Hoyt Koch, MD    Physical Exam: Vitals:   12/10/20 1711  BP: (!) 98/54  Pulse: (!) 101  Temp: 98 F (36.7 C)  TempSrc: Oral  SpO2: 97%    Constitutional: NAD, calm, comfortable Vitals:   12/10/20 1711  BP: (!) 98/54  Pulse: (!) 101  Temp: 98 F (36.7 C)  TempSrc: Oral  SpO2: 97%   Eyes: PERRL, lids and conjunctivae normal ENMT: Mucous membranes are moist. Posterior pharynx clear of any exudate or lesions.Normal dentition.  Neck: normal, supple, no masses, no thyromegaly Respiratory: clear to auscultation bilaterally, no wheezing, no crackles. Normal respiratory effort. No accessory muscle use.  Cardiovascular: Regular rate and rhythm, no murmurs / rubs / gallops. No extremity edema. 2+ pedal pulses. No carotid bruits.  Abdomen: no tenderness, no masses palpated. No hepatosplenomegaly. Bowel sounds positive.  Musculoskeletal: Dressing in the left lower extremity and left foot. Skin: no rashes, lesions, ulcers. No induration Neurologic: CN 2-12 grossly intact. Sensation intact, DTR normal. Strength 5/5 in all 4.     Labs on Admission: I have personally reviewed following labs and imaging studies  CBC: No results for input(s): WBC, NEUTROABS, HGB, HCT, MCV, PLT in the last 168 hours. Basic Metabolic Panel: No results for input(s): NA, K, CL, CO2, GLUCOSE, BUN, CREATININE, CALCIUM, MG, PHOS in the last 168 hours. GFR: CrCl cannot be calculated (Patient's most recent lab result is older than the maximum 21 days allowed.). Liver Function Tests: No results for input(s): AST, ALT, ALKPHOS, BILITOT, PROT, ALBUMIN in the last 168 hours. No results for input(s): LIPASE, AMYLASE in the last 168 hours. No results for input(s): AMMONIA in the last 168 hours. Coagulation Profile: No results for input(s): INR, PROTIME in the last 168 hours. Cardiac Enzymes: No results for input(s): CKTOTAL, CKMB,  CKMBINDEX, TROPONINI in the last 168 hours. BNP (last 3 results) No results for input(s): PROBNP in the last 8760 hours. HbA1C: No results for input(s): HGBA1C in the last 72 hours. CBG: No results for input(s): GLUCAP in the last 168 hours. Lipid Profile: No results for input(s): CHOL, HDL, LDLCALC, TRIG, CHOLHDL, LDLDIRECT in the last 72 hours. Thyroid Function Tests: No results for input(s): TSH, T4TOTAL, FREET4, T3FREE, THYROIDAB in the last 72 hours. Anemia Panel: No results for input(s): VITAMINB12, FOLATE, FERRITIN, TIBC, IRON, RETICCTPCT in the last 72 hours. Urine analysis:    Component Value Date/Time   COLORURINE LT. YELLOW 10/05/2011 1146   APPEARANCEUR CLEAR 10/05/2011 1146   LABSPEC >=1.030 10/05/2011 1146   PHURINE 6.0 10/05/2011 1146   GLUCOSEU NEGATIVE 10/05/2011 1146   HGBUR NEGATIVE 10/05/2011 1146   BILIRUBINUR NEGATIVE 10/05/2011 1146   KETONESUR NEGATIVE 10/05/2011 1146   UROBILINOGEN 0.2  10/05/2011 1146   NITRITE NEGATIVE 10/05/2011 [place] 10/05/2011 1146    Radiological Exams on Admission: No results found.  Assessment/Plan Active Problems:   Foot fracture, left   Left divergent Lisfranc fracture, dorsomedial dislocation of first metatarsal and lateral dislocation of metatarsals.  Multiple comminuted intra-articular midfoot fractures: She is being directly admitted per request from podiatry with the plan for surgical repair tomorrow.  We will add some as needed Tylenol and opioid medications to control the pain.  Will obtain basic labs including CBC, BMP and magnesium.  We will repeat labs in the morning as well.  We will order COVID test.  GERD: Resume famotidine.  Chronic constipation: Resume home medications.  Hyperlipidemia: Resume Zocor.  DVT prophylaxis: enoxaparin (LOVENOX) injection 40 mg Start: 12/10/20 1830 Code Status: Full code Family Communication: None present at bedside.  Plan of care discussed with patient in  length and he verbalized understanding and agreed with it. Disposition Plan: Will likely need rehabilitation after surgery. Consults called: Podiatry aware Admission status: Inpatient   Status is: Inpatient  Remains inpatient appropriate because:Inpatient level of care appropriate due to severity of illness  Dispo: The patient is from: Home              Anticipated d/c is to: SNF              Patient currently is not medically stable to d/c.   Difficult to place patient No       Darliss Cheney MD [facility]  12/10/2020, 5:33 PM  To contact the attending provider between 7A-7P or the covering provider during after hours 7P-7A, please log into the web site [email or web address]

## 2020-12-10 NOTE — Telephone Encounter (Signed)
Noted. Thanks.

## 2020-12-10 NOTE — Telephone Encounter (Signed)
Spoke with patient's sister therapy did not go well and she had a difficult time and would not be able to maintain nonweightbearing safely.  I spoke with the admission team with [facility] she is being directly admitted today for operative management tomorrow.  Plan to discharge to subacute rehab or SNF postop.  Her sister already spoke with the patient service rep at [facility]) and they have beds available and this would be the preferred place for them.  Lanae Crumbly, DPM 12/10/2020

## 2020-12-10 NOTE — Progress Notes (Signed)
   12/10/20 1711  Assess: MEWS Score  Temp 98 F (36.7 C)  BP (!) 98/54  Pulse Rate (!) 101  SpO2 97 %  O2 Device Room Air  Assess: MEWS Score  MEWS Temp 0  MEWS Systolic 1  MEWS Pulse 1  MEWS RR 0  MEWS LOC 0  MEWS Score 2  MEWS Score Color Yellow  Assess: if the MEWS score is Yellow or Red  Were vital signs taken at a resting state? Yes  Focused Assessment No change from prior assessment  Does the patient meet 2 or more of the SIRS criteria? No  MEWS guidelines implemented *See Row Information* Yes  Treat  MEWS Interventions Other (Comment)  Take Vital Signs  Increase Vital Sign Frequency  Yellow: Q 2hr X 2 then Q 4hr X 2, if remains yellow, continue Q 4hrs  Escalate  MEWS: Escalate Yellow: discuss with charge nurse/RN and consider discussing with provider and RRT  Notify: Charge Nurse/RN  Name of Charge Nurse/RN Notified keiva  Date Charge Nurse/RN Notified 12/10/20  Time Charge Nurse/RN Notified 3  Notify: Provider  Provider Name/Title mcdonald  Date Provider Notified 12/10/20  Time Provider Notified 1718  Notification Type Face-to-face  Notification Reason Other (Comment)  Provider response No new orders  Date of Provider Response 12/10/20  Time of Provider Response 1737  Document  Patient Outcome Other (Comment)  Progress note created (see row info) Yes  Assess: SIRS CRITERIA  SIRS Temperature  0  SIRS Pulse 1  SIRS Respirations  0  SIRS WBC 0  SIRS Score Sum  1

## 2020-12-10 NOTE — Telephone Encounter (Signed)
Next visit is 12/22/20. Nancy Brooks, [name]'s caretaker called to say [name] will be going into the hospital tomorrow for foot surgery. She will be non-weightbearing for some time and will be having rehab after surgery. Just informing of this.

## 2020-12-11 ENCOUNTER — Encounter (HOSPITAL_COMMUNITY): Payer: Self-pay | Admitting: Family Medicine

## 2020-12-11 ENCOUNTER — Inpatient Hospital Stay (HOSPITAL_COMMUNITY): Payer: Medicare Other | Admitting: Anesthesiology

## 2020-12-11 ENCOUNTER — Encounter (HOSPITAL_COMMUNITY)
Admission: AD | Disposition: A | Discharge status: Skilled Nursing Facility | Payer: Self-pay | Source: Ambulatory Visit | Attending: Internal Medicine

## 2020-12-11 DIAGNOSIS — S92902P Unspecified fracture of left foot, subsequent encounter for fracture with malunion: Secondary | ICD-10-CM | POA: Diagnosis not present

## 2020-12-11 DIAGNOSIS — K5900 Constipation, unspecified: Secondary | ICD-10-CM | POA: Diagnosis present

## 2020-12-11 DIAGNOSIS — K5904 Chronic idiopathic constipation: Secondary | ICD-10-CM | POA: Diagnosis not present

## 2020-12-11 DIAGNOSIS — F209 Schizophrenia, unspecified: Secondary | ICD-10-CM

## 2020-12-11 DIAGNOSIS — K219 Gastro-esophageal reflux disease without esophagitis: Secondary | ICD-10-CM

## 2020-12-11 DIAGNOSIS — E785 Hyperlipidemia, unspecified: Secondary | ICD-10-CM | POA: Diagnosis not present

## 2020-12-11 HISTORY — PX: CLOSED REDUCTION METATARSAL: SHX5774

## 2020-12-11 LAB — HIV ANTIBODY (ROUTINE TESTING W REFLEX): HIV Screen 4th Generation wRfx: NONREACTIVE

## 2020-12-11 LAB — CBC
HCT: 35 % — ABNORMAL LOW (ref 36.0–46.0)
Hemoglobin: 11.2 g/dL — ABNORMAL LOW (ref 12.0–15.0)
MCH: 28.9 pg (ref 26.0–34.0)
MCHC: 32 g/dL (ref 30.0–36.0)
MCV: 90.4 fL (ref 80.0–100.0)
Platelets: 226 10*3/uL (ref 150–400)
RBC: 3.87 MIL/uL (ref 3.87–5.11)
RDW: 14.6 % (ref 11.5–15.5)
WBC: 5.6 10*3/uL (ref 4.0–10.5)
nRBC: 0 % (ref 0.0–0.2)

## 2020-12-11 LAB — BASIC METABOLIC PANEL
Anion gap: 5 (ref 5–15)
BUN: 28 mg/dL — ABNORMAL HIGH (ref 8–23)
CO2: 29 mmol/L (ref 22–32)
Calcium: 8.9 mg/dL (ref 8.9–10.3)
Chloride: 110 mmol/L (ref 98–111)
Creatinine, Ser: 0.7 mg/dL (ref 0.44–1.00)
GFR, Estimated: >60 mL/min (ref >60)
Glucose, Bld: 92 mg/dL (ref 70–99)
Potassium: 3.9 mmol/L (ref 3.5–5.1)
Sodium: 144 mmol/L (ref 135–145)

## 2020-12-11 SURGERY — CLOSED REDUCTION, FRACTURE, METATARSAL BONE
Anesthesia: General | Site: Toe | Laterality: Left

## 2020-12-11 MED ORDER — OXYCODONE HCL 5 MG/5ML PO SOLN
5.0000 mg | Freq: Once | ORAL | Status: DC | PRN
Start: 2020-12-11 — End: 2020-12-11

## 2020-12-11 MED ORDER — ONDANSETRON HCL 4 MG/2ML IJ SOLN
INTRAMUSCULAR | Status: DC | PRN
  Administered 2020-12-11: 4 mg via INTRAVENOUS

## 2020-12-11 MED ORDER — MIDAZOLAM HCL 2 MG/2ML IJ SOLN
INTRAMUSCULAR | Status: AC
  Filled 2020-12-11: qty 2

## 2020-12-11 MED ORDER — FENTANYL CITRATE (PF) 100 MCG/2ML IJ SOLN: 25.0000 ug | INTRAMUSCULAR | Status: DC | PRN

## 2020-12-11 MED ORDER — BACITRACIN ZINC 500 UNIT/GM EX OINT
TOPICAL_OINTMENT | CUTANEOUS | Status: AC
  Filled 2020-12-11: qty 28.35

## 2020-12-11 MED ORDER — PROPOFOL 10 MG/ML IV BOLUS
INTRAVENOUS | Status: AC
  Filled 2020-12-11: qty 20

## 2020-12-11 MED ORDER — PHENYLEPHRINE 40 MCG/ML (10ML) SYRINGE FOR IV PUSH (FOR BLOOD PRESSURE SUPPORT)
PREFILLED_SYRINGE | INTRAVENOUS | Status: AC
  Filled 2020-12-11: qty 10

## 2020-12-11 MED ORDER — CLOZAPINE 100 MG PO TABS
400.0000 mg | ORAL_TABLET | Freq: Every day | ORAL | Status: DC
  Administered 2020-12-11 – 2020-12-16 (×6): 400 mg via ORAL
  Filled 2020-12-11 (×6): qty 4

## 2020-12-11 MED ORDER — FENTANYL CITRATE (PF) 100 MCG/2ML IJ SOLN
INTRAMUSCULAR | Status: AC
  Filled 2020-12-11: qty 2

## 2020-12-11 MED ORDER — CLOZAPINE 100 MG PO TABS
100.0000 mg | ORAL_TABLET | Freq: Every day | ORAL | Status: DC
  Administered 2020-12-12 – 2020-12-17 (×6): 100 mg via ORAL
  Filled 2020-12-11 (×6): qty 1

## 2020-12-11 MED ORDER — LACTATED RINGERS IV SOLN: INTRAVENOUS | Status: DC

## 2020-12-11 MED ORDER — MEPERIDINE HCL 50 MG/ML IJ SOLN: 6.2500 mg | INTRAMUSCULAR | Status: DC | PRN

## 2020-12-11 MED ORDER — ONDANSETRON HCL 4 MG/2ML IJ SOLN
INTRAMUSCULAR | Status: AC
  Filled 2020-12-11: qty 2

## 2020-12-11 MED ORDER — LACTATED RINGERS IV SOLN: INTRAVENOUS | Status: DC | PRN

## 2020-12-11 MED ORDER — BUPIVACAINE HCL (PF) 0.5 % IJ SOLN
INTRAMUSCULAR | Status: AC
  Filled 2020-12-11: qty 30

## 2020-12-11 MED ORDER — ACETAMINOPHEN 160 MG/5ML PO SOLN: 325.0000 mg | ORAL | Status: DC | PRN

## 2020-12-11 MED ORDER — CEFAZOLIN SODIUM-DEXTROSE 2-4 GM/100ML-% IV SOLN
INTRAVENOUS | Status: AC
  Filled 2020-12-11: qty 100

## 2020-12-11 MED ORDER — PHENYLEPHRINE 40 MCG/ML (10ML) SYRINGE FOR IV PUSH (FOR BLOOD PRESSURE SUPPORT)
PREFILLED_SYRINGE | INTRAVENOUS | Status: DC | PRN
  Administered 2020-12-11 (×5): 80 ug via INTRAVENOUS

## 2020-12-11 MED ORDER — VASOPRESSIN 20 UNIT/ML IV SOLN
INTRAVENOUS | Status: DC | PRN
  Administered 2020-12-11 (×4): 1 [IU] via INTRAVENOUS

## 2020-12-11 MED ORDER — BACITRACIN ZINC 500 UNIT/GM EX OINT
TOPICAL_OINTMENT | CUTANEOUS | Status: DC | PRN
  Administered 2020-12-11: 1 via TOPICAL

## 2020-12-11 MED ORDER — 0.9 % SODIUM CHLORIDE (POUR BTL) OPTIME
TOPICAL | Status: DC | PRN
  Administered 2020-12-11: 1000 mL

## 2020-12-11 MED ORDER — EPHEDRINE SULFATE-NACL 50-0.9 MG/10ML-% IV SOSY
PREFILLED_SYRINGE | INTRAVENOUS | Status: DC | PRN
  Administered 2020-12-11 (×3): 10 mg via INTRAVENOUS

## 2020-12-11 MED ORDER — MIDAZOLAM HCL 2 MG/2ML IJ SOLN
INTRAMUSCULAR | Status: DC | PRN
  Administered 2020-12-11: 2 mg via INTRAVENOUS

## 2020-12-11 MED ORDER — PROPOFOL 10 MG/ML IV BOLUS
INTRAVENOUS | Status: DC | PRN
  Administered 2020-12-11: 110 mg via INTRAVENOUS

## 2020-12-11 MED ORDER — BUPIVACAINE HCL (PF) 0.5 % IJ SOLN
INTRAMUSCULAR | Status: DC | PRN
  Administered 2020-12-11: 15 mL

## 2020-12-11 MED ORDER — LIDOCAINE 2% (20 MG/ML) 5 ML SYRINGE
INTRAMUSCULAR | Status: AC
  Filled 2020-12-11: qty 5

## 2020-12-11 MED ORDER — DEXAMETHASONE SODIUM PHOSPHATE 10 MG/ML IJ SOLN
INTRAMUSCULAR | Status: DC | PRN
  Administered 2020-12-11: 4 mg via INTRAVENOUS

## 2020-12-11 MED ORDER — ACETAMINOPHEN 325 MG PO TABS: 325.0000 mg | ORAL_TABLET | ORAL | Status: DC | PRN

## 2020-12-11 MED ORDER — EPHEDRINE 5 MG/ML INJ
INTRAVENOUS | Status: AC
  Filled 2020-12-11: qty 5

## 2020-12-11 MED ORDER — FENTANYL CITRATE (PF) 100 MCG/2ML IJ SOLN
INTRAMUSCULAR | Status: DC | PRN
  Administered 2020-12-11: 50 ug via INTRAVENOUS

## 2020-12-11 MED ORDER — CEFAZOLIN SODIUM-DEXTROSE 2-3 GM-%(50ML) IV SOLR
INTRAVENOUS | Status: DC | PRN
  Administered 2020-12-11: 2 g via INTRAVENOUS

## 2020-12-11 MED ORDER — OXYCODONE HCL 5 MG PO TABS: 5.0000 mg | ORAL_TABLET | Freq: Once | ORAL | Status: DC | PRN

## 2020-12-11 MED ORDER — LIDOCAINE 2% (20 MG/ML) 5 ML SYRINGE
INTRAMUSCULAR | Status: DC | PRN
  Administered 2020-12-11: 40 mg via INTRAVENOUS

## 2020-12-11 MED ORDER — ONDANSETRON HCL 4 MG/2ML IJ SOLN: 4.0000 mg | Freq: Once | INTRAMUSCULAR | Status: DC | PRN

## 2020-12-11 MED ORDER — VASOPRESSIN 20 UNIT/ML IV SOLN
INTRAVENOUS | Status: AC
  Filled 2020-12-11: qty 1

## 2020-12-11 MED ORDER — DEXAMETHASONE SODIUM PHOSPHATE 10 MG/ML IJ SOLN
INTRAMUSCULAR | Status: AC
  Filled 2020-12-11: qty 1

## 2020-12-11 SURGICAL SUPPLY — 63 items
APL PRP STRL LF DISP 70% ISPRP (MISCELLANEOUS)
BAG COUNTER SPONGE SURGICOUNT (BAG) ×3 IMPLANT
BAG SPNG CNTER NS LX DISP (BAG) ×2
BANDAGE ESMARK 6X9 LF (GAUZE/BANDAGES/DRESSINGS) ×2 IMPLANT
BLADE AVERAGE 25X9 (BLADE) IMPLANT
BLADE MINI RND TIP GREEN BEAV (BLADE) ×2 IMPLANT
BLADE OSC/SAG .038X5.5 CUT EDG (BLADE) IMPLANT
BLADE SURG 15 STRL LF DISP TIS (BLADE) ×2 IMPLANT
BLADE SURG 15 STRL SS (BLADE) ×3
BNDG CMPR 9X6 STRL LF SNTH (GAUZE/BANDAGES/DRESSINGS)
BNDG ELASTIC 4X5.8 VLCR STR LF (GAUZE/BANDAGES/DRESSINGS) ×3 IMPLANT
BNDG ESMARK 6X9 LF (GAUZE/BANDAGES/DRESSINGS)
BNDG GAUZE ELAST 4 BULKY (GAUZE/BANDAGES/DRESSINGS) ×3 IMPLANT
CHLORAPREP W/TINT 26 (MISCELLANEOUS) ×2 IMPLANT
COVER BACK TABLE 60X90IN (DRAPES) ×3 IMPLANT
CUFF TOURN SGL QUICK 18X4 (TOURNIQUET CUFF) ×1 IMPLANT
CUFF TOURN SGL QUICK 24 (TOURNIQUET CUFF)
CUFF TOURN SGL QUICK 34 (TOURNIQUET CUFF)
CUFF TRNQT CYL 24X4X16.5-23 (TOURNIQUET CUFF) IMPLANT
CUFF TRNQT CYL 34X4.125X (TOURNIQUET CUFF) IMPLANT
DECANTER SPIKE VIAL GLASS SM (MISCELLANEOUS) IMPLANT
DRAPE 3/4 80X56 (DRAPES) ×3 IMPLANT
DRAPE EXTREMITY T 121X128X90 (DISPOSABLE) ×3 IMPLANT
DRAPE OEC MINIVIEW 54X84 (DRAPES) ×4 IMPLANT
DRAPE U-SHAPE 47X51 STRL (DRAPES) ×3 IMPLANT
ELECT REM PT RETURN 15FT ADLT (MISCELLANEOUS) ×3 IMPLANT
GAUZE SPONGE 4X4 12PLY STRL (GAUZE/BANDAGES/DRESSINGS) ×3 IMPLANT
GAUZE XEROFORM 1X8 LF (GAUZE/BANDAGES/DRESSINGS) ×3 IMPLANT
GLOVE SRG 8 PF TXTR STRL LF DI (GLOVE) ×2 IMPLANT
GLOVE SURG ENC MOIS LTX SZ7.5 (GLOVE) ×3 IMPLANT
GLOVE SURG ENC TEXT LTX SZ7.5 (GLOVE) IMPLANT
GLOVE SURG LTX SZ8 (GLOVE) ×3 IMPLANT
GLOVE SURG UNDER POLY LF SZ8 (GLOVE) ×3
GOWN STRL REUS W/ TWL XL LVL3 (GOWN DISPOSABLE) ×2 IMPLANT
GOWN STRL REUS W/TWL XL LVL3 (GOWN DISPOSABLE) ×3
KIT BASIN OR (CUSTOM PROCEDURE TRAY) ×3 IMPLANT
NDL HYPO 25X1 1.5 SAFETY (NEEDLE) IMPLANT
NEEDLE HYPO 22GX1.5 SAFETY (NEEDLE) ×1 IMPLANT
NEEDLE HYPO 25X1 1.5 SAFETY (NEEDLE) IMPLANT
NS IRRIG 1000ML POUR BTL (IV SOLUTION) ×3 IMPLANT
PAD CAST 4YDX4 CTTN HI CHSV (CAST SUPPLIES) IMPLANT
PADDING CAST COTTON 4X4 STRL (CAST SUPPLIES) ×3
PENCIL SMOKE EVACUATOR (MISCELLANEOUS) ×2 IMPLANT
PIN CAPS ORTHO GREEN .062 (PIN) ×1 IMPLANT
PIN STEINMAN 2X229 (PIN) ×4 IMPLANT
SPLINT FIBERGLASS 4X30 (CAST SUPPLIES) ×1 IMPLANT
SPLINT FIBERGLASS 5X30 (CAST SUPPLIES) ×1 IMPLANT
STAPLER VISISTAT 35W (STAPLE) IMPLANT
SUCTION FRAZIER HANDLE 10FR (MISCELLANEOUS) ×3
SUCTION TUBE FRAZIER 10FR DISP (MISCELLANEOUS) ×2 IMPLANT
SUT ETHILON 3 0 PS 1 (SUTURE) ×1 IMPLANT
SUT ETHILON 4 0 PS 2 18 (SUTURE) IMPLANT
SUT MNCRL AB 3-0 PS2 18 (SUTURE) IMPLANT
SUT MNCRL AB 4-0 PS2 18 (SUTURE) IMPLANT
SUT VIC AB 2-0 SH 27 (SUTURE)
SUT VIC AB 2-0 SH 27XBRD (SUTURE) IMPLANT
SYR BULB EAR ULCER 3OZ GRN STR (SYRINGE) ×3 IMPLANT
SYR CONTROL 10ML LL (SYRINGE) ×1 IMPLANT
TRAY PREP A LATEX SAFE STRL (SET/KITS/TRAYS/PACK) IMPLANT
TUBING CONNECTING 10 (TUBING) ×3 IMPLANT
UNDERPAD 30X36 HEAVY ABSORB (UNDERPADS AND DIAPERS) ×3 IMPLANT
YANKAUER SUCT BULB TIP NO VENT (SUCTIONS) IMPLANT
zimmer pins ×4 IMPLANT

## 2020-12-11 NOTE — Anesthesia Procedure Notes (Signed)
Procedure Name: LMA Insertion Date/Time: 12/11/2020 10:56 AM Performed by: Eben Burow, CRNA Pre-anesthesia Checklist: Patient identified, Emergency Drugs available, Suction available, Patient being monitored and Timeout performed Patient Re-evaluated:Patient Re-evaluated prior to induction Oxygen Delivery Method: Circle system utilized Preoxygenation: Pre-oxygenation with 100% oxygen Induction Type: IV induction Ventilation: Mask ventilation without difficulty LMA: LMA inserted LMA Size: 4.0 Number of attempts: 1 Tube secured with: Tape Dental Injury: Teeth and Oropharynx as per pre-operative assessment

## 2020-12-11 NOTE — Anesthesia Postprocedure Evaluation (Signed)
Anesthesia Post Note  Patient: Nancy Brooks  Procedure(s) Performed: CLOSED REDUCTION METATARSAL (Left: Toe) External Fixation Foot (Left)     Patient location during evaluation: PACU Anesthesia Type: General Level of consciousness: awake and alert Pain management: pain level controlled Vital Signs Assessment: post-procedure vital signs reviewed and stable Respiratory status: spontaneous breathing, nonlabored ventilation, respiratory function stable and patient connected to nasal cannula oxygen Cardiovascular status: blood pressure returned to baseline and stable Postop Assessment: no apparent nausea or vomiting Anesthetic complications: no   No notable events documented.  Last Vitals:  Vitals:   12/11/20 1359 12/11/20 1404  BP: (!) 81/45 99/63  Pulse: 100 100  Resp: 15   Temp: 36.5 C   SpO2: 97%     Last Pain:  Vitals:   12/11/20 1359  TempSrc: Oral  PainSc:                  Nancy Brooks

## 2020-12-11 NOTE — Brief Op Note (Signed)
12/11/2020  12:28 PM  PATIENT:  [name]  70 y.o. female  PRE-OPERATIVE DIAGNOSIS:  Lisfranc Fracture dislocation  POST-OPERATIVE DIAGNOSIS:  Lisfranc Fracture dislocation  PROCEDURE:  Procedure(s) with comments: CLOSED REDUCTION METATARSAL (Left) - reduction and pinning (2.26m Steinmann pins) External Fixation Foot (Left) - [employer] Rep (Creola Corn  SURGEON:  Surgeon(s) and Role:    * Jonquil Stubbe, AStephan Minister DPM - Primary  ASSISTANTS: none   ANESTHESIA:   general  EBL:  <10cc   BLOOD ADMINISTERED:none  DRAINS: none   LOCAL MEDICATIONS USED:  MARCAINE  1/2% and Amount: 19 ml  SPECIMEN:  No Specimen  DISPOSITION OF SPECIMEN:  N/A  COUNTS:  YES  TOURNIQUET:  n/a  DICTATION: .Note written in EPIC  PLAN OF CARE: Admit to inpatient   PATIENT DISPOSITION:  PACU - hemodynamically stable.   Delay start of Pharmacological VTE agent (>24hrs) due to surgical blood loss or risk of bleeding: yes

## 2020-12-11 NOTE — Progress Notes (Signed)
History and Physical Interval Note:  12/11/2020 10:06 AM  Nancy Brooks  has presented today for surgery, with the diagnosis of left foot Lisfranc dislocation and fracture.  The various methods of treatment have been discussed with the patient and family. After consideration of risks, benefits and other options for treatment, the patient has consented to   Reduction foot fracture, pinning, possible external fixator, as a surgical intervention.  The patient's history has been reviewed, patient examined, no change in status, stable for surgery.  I have reviewed the patient's chart and labs.  Questions were answered to the patient's satisfaction.     [name]

## 2020-12-11 NOTE — Transfer of Care (Signed)
Immediate Anesthesia Transfer of Care Note  Patient: Nancy Brooks  Procedure(s) Performed: CLOSED REDUCTION METATARSAL (Left: Toe) External Fixation Foot (Left)  Patient Location: PACU  Anesthesia Type:General  Level of Consciousness: drowsy and patient cooperative  Airway & Oxygen Therapy: Patient Spontanous Breathing and Patient connected to face mask oxygen  Post-op Assessment: Report given to RN and Post -op Vital signs reviewed and stable  Post vital signs: Reviewed and stable  Last Vitals:  Vitals Value Taken Time  BP 126/65 12/11/20 1230  Temp    Pulse 91 12/11/20 1231  Resp 11 12/11/20 1231  SpO2 100 % 12/11/20 1231  Vitals shown include unvalidated device data.  Last Pain:  Vitals:   12/11/20 0905  TempSrc: Oral  PainSc: 7       Patients Stated Pain Goal: 3 ([id number])  Complications: No notable events documented.

## 2020-12-11 NOTE — Anesthesia Preprocedure Evaluation (Addendum)
Anesthesia Evaluation  Patient identified by MRN, date of birth, ID band Patient awake    Reviewed: Allergy & Precautions, H&P , NPO status , Patient's Chart, lab work & pertinent test results, reviewed documented beta blocker date and time   Airway Mallampati: II  TM Distance: >3 FB Neck ROM: full    Dental no notable dental hx. (+) Teeth Intact, Dental Advisory Given, Poor Dentition   Pulmonary neg pulmonary ROS,    Pulmonary exam normal breath sounds clear to auscultation       Cardiovascular Exercise Tolerance: Good negative cardio ROS   Rhythm:regular Rate:Normal     Neuro/Psych PSYCHIATRIC DISORDERS Anxiety Depression Schizophrenia negative neurological ROS     GI/Hepatic Neg liver ROS, GERD  Medicated,  Endo/Other  negative endocrine ROS  Renal/GU negative Renal ROS  negative genitourinary   Musculoskeletal   Abdominal   Peds  Hematology negative hematology ROS (+)   Anesthesia Other Findings   Reproductive/Obstetrics negative OB ROS                            Anesthesia Physical Anesthesia Plan  ASA: 3  Anesthesia Plan: General   Post-op Pain Management:    Induction: Intravenous  PONV Risk Score and Plan: 3 and Ondansetron and Dexamethasone  Airway Management Planned: LMA  Additional Equipment: None  Intra-op Plan:   Post-operative Plan: Extubation in OR  Informed Consent: I have reviewed the patients History and Physical, chart, labs and discussed the procedure including the risks, benefits and alternatives for the proposed anesthesia with the patient or authorized representative who has indicated his/her understanding and acceptance.     Dental Advisory Given  Plan Discussed with: CRNA and Anesthesiologist  Anesthesia Plan Comments: ( )        Anesthesia Quick Evaluation

## 2020-12-11 NOTE — Progress Notes (Signed)
PROGRESS NOTE    Nancy Brooks  [id number] DOB: March 16, 1951 DOA: 12/10/2020 PCP: Hoyt Koch, MD (Confirm with patient/family/NH records and if not entered, this HAS to be entered at [facility] point of entry. "No PCP" if truly none.)   No chief complaint on file.   Brief Narrative:  Patient 70 year old female history of depression, anxiety, GERD, hyperlipidemia admitted directly from podiatry office for repair of Lisfranc fracture on left foot.   Assessment & Plan:   Principal Problem:   Foot fracture, left Active Problems:   Hyperlipidemia   Schizophrenia, unspecified type (HCC)   GERD   Constipation  #1 left lower extremity Lisfranc fracture dislocation -Status post closed reduction left metatarsal-reduction and pinning external fixation left foot per orthopedics, Dr. Sherryle Lis. -PT evaluation pending. -We will need SNF placement. -Per orthopedics.  2.  GERD -Continue famotidine.  3.  Chronic constipation -Continue Linzess.  4.  Hyperlipidemia -Statin.  5.  Borderline blood pressure -IV fluids.  6.  Schizophrenia/depression -Stable. -Resume home regimen Clozaril, Librium   DVT prophylaxis: Postop per orthopedics Code Status: Full Family Communication: Updated patient, brother at bedside. Disposition:   Status is: Inpatient  Remains inpatient appropriate because:Unsafe d/c plan  Dispo: The patient is from: Home              Anticipated d/c is to: SNF              Patient currently is not medically stable to d/c.   Difficult to place patient No       Consultants:  Podiatry: Dr. Sherryle Lis  Procedures:  Closed reduction left metatarsal-reduction and pinning external fixation foot left per Dr. Sherryle Lis podiatry 12/11/2020  Antimicrobials:  None   Subjective: Postop, just returned from PACU.  Sitting up in chair.  Denies any chest pain, no shortness of breath.  Brother at bedside.  Objective: Vitals:   12/11/20 1250 12/11/20 1300  12/11/20 1359 12/11/20 1404  BP:  (!) 97/52 (!) 81/45 99/63  Pulse: 95  100 100  Resp: 13  15   Temp: 97.6 F (36.4 C)  97.7 F (36.5 C)   TempSrc:   Oral   SpO2: 96% 98% 97%     Intake/Output Summary (Last 24 hours) at 12/11/2020 1855 Last data filed at 12/11/2020 1707 Gross per 24 hour  Intake 2320.01 ml  Output 610 ml  Net 1710.01 ml   There were no vitals filed for this visit.  Examination:  General exam: Appears calm and comfortable  Respiratory system: Clear to auscultation. Respiratory effort normal. Cardiovascular system: S1 & S2 heard, RRR. No JVD, murmurs, rubs, gallops or clicks. No pedal edema. Gastrointestinal system: Abdomen is nondistended, soft and nontender. No organomegaly or masses felt. Normal bowel sounds heard. Central nervous system: Alert and oriented. No focal neurological deficits. Extremities: Left foot in postop bandage/splint.   Skin: No rashes, lesions or ulcers Psychiatry: Judgement and insight appear normal. Mood & affect appropriate.     Data Reviewed: I have personally reviewed following labs and imaging studies  CBC: Recent Labs  Lab 12/10/20 1918 12/11/20 0527  WBC 7.0 5.6  NEUTROABS 5.7  --   HGB 11.4* 11.2*  HCT 36.2 35.0*  MCV 89.4 90.4  PLT 245 [id number]    Basic Metabolic Panel: Recent Labs  Lab 12/10/20 1918 12/11/20 0527  NA 142 144  K 4.1 3.9  CL 106 110  CO2 30 29  GLUCOSE 161* 92  BUN 30* 28*  CREATININE 0.96 0.70  CALCIUM 9.0 8.9  MG 2.4  --     GFR: CrCl cannot be calculated (Unknown ideal weight.).  Liver Function Tests: No results for input(s): AST, ALT, ALKPHOS, BILITOT, PROT, ALBUMIN in the last 168 hours.  CBG: No results for input(s): GLUCAP in the last 168 hours.   Recent Results (from the past 240 hour(s))  Resp Panel by RT-PCR (Flu A&B, Covid) Nasopharyngeal Swab     Status: None   Collection Time: 12/10/20  5:30 PM   Specimen: Nasopharyngeal Swab; Nasopharyngeal(NP) swabs in vial transport  medium  Result Value Ref Range Status   SARS Coronavirus 2 by RT PCR NEGATIVE NEGATIVE Final    Comment: (NOTE) SARS-CoV-2 target nucleic acids are NOT DETECTED.  The SARS-CoV-2 RNA is generally detectable in upper respiratory specimens during the acute phase of infection. The lowest concentration of SARS-CoV-2 viral copies this assay can detect is 138 copies/mL. A negative result does not preclude SARS-Cov-2 infection and should not be used as the sole basis for treatment or other patient management decisions. A negative result may occur with  improper specimen collection/handling, submission of specimen other than nasopharyngeal swab, presence of viral mutation(s) within the areas targeted by this assay, and inadequate number of viral copies(<138 copies/mL). A negative result must be combined with clinical observations, patient history, and epidemiological information. The expected result is Negative.  Fact Sheet for Patients:  EntrepreneurPulse.com.au  Fact Sheet for Healthcare Providers:  IncredibleEmployment.be  This test is no t yet approved or cleared by the [place] FDA and  has been authorized for detection and/or diagnosis of SARS-CoV-2 by FDA under an Emergency Use Authorization (EUA). This EUA will remain  in effect (meaning this test can be used) for the duration of the COVID-19 declaration under Section 564(b)(1) of the Act, 21 U.S.C.section 360bbb-3(b)(1), unless the authorization is terminated  or revoked sooner.       Influenza A by PCR NEGATIVE NEGATIVE Final   Influenza B by PCR NEGATIVE NEGATIVE Final    Comment: (NOTE) The Xpert Xpress SARS-CoV-2/FLU/RSV plus assay is intended as an aid in the diagnosis of influenza from Nasopharyngeal swab specimens and should not be used as a sole basis for treatment. Nasal washings and aspirates are unacceptable for Xpert Xpress SARS-CoV-2/FLU/RSV testing.  Fact Sheet for  Patients: EntrepreneurPulse.com.au  Fact Sheet for Healthcare Providers: IncredibleEmployment.be  This test is not yet approved or cleared by the [place] FDA and has been authorized for detection and/or diagnosis of SARS-CoV-2 by FDA under an Emergency Use Authorization (EUA). This EUA will remain in effect (meaning this test can be used) for the duration of the COVID-19 declaration under Section 564(b)(1) of the Act, 21 U.S.C. section 360bbb-3(b)(1), unless the authorization is terminated or revoked.  Performed at [facility], [place] [address], [place], [place] [address]          Radiology Studies: No results found.      Scheduled Meds:  calcium carbonate  1,000 mg Oral Daily   chlordiazePOXIDE  10 mg Oral BID   [START ON 12/12/2020] cloZAPine  100 mg Oral Daily   And   cloZAPine  400 mg Oral QHS   enoxaparin (LOVENOX) injection  40 mg Subcutaneous Q24H   famotidine  10 mg Oral Daily   linaclotide  145 mcg Oral QAC breakfast   simvastatin  40 mg Oral q1800   Continuous Infusions:  sodium chloride 125 mL/hr at 12/11/20 1745   ceFAZolin  LOS: 1 day    Time spent: 35 minutes    Irine Seal, MD [facility]   To contact the attending provider between 7A-7P or the covering provider during after hours 7P-7A, please log into the web site [email or web address] and access using universal [place] password for that web site. If you do not have the password, please call the hospital operator.  12/11/2020, 6:55 PM

## 2020-12-12 DIAGNOSIS — S93325A Dislocation of tarsometatarsal joint of left foot, initial encounter: Principal | ICD-10-CM

## 2020-12-12 DIAGNOSIS — S92902P Unspecified fracture of left foot, subsequent encounter for fracture with malunion: Secondary | ICD-10-CM | POA: Diagnosis not present

## 2020-12-12 DIAGNOSIS — S92902A Unspecified fracture of left foot, initial encounter for closed fracture: Secondary | ICD-10-CM

## 2020-12-12 DIAGNOSIS — E785 Hyperlipidemia, unspecified: Secondary | ICD-10-CM | POA: Diagnosis not present

## 2020-12-12 DIAGNOSIS — K5904 Chronic idiopathic constipation: Secondary | ICD-10-CM | POA: Diagnosis not present

## 2020-12-12 DIAGNOSIS — K219 Gastro-esophageal reflux disease without esophagitis: Secondary | ICD-10-CM | POA: Diagnosis not present

## 2020-12-12 LAB — BASIC METABOLIC PANEL
Anion gap: 7 (ref 5–15)
BUN: 15 mg/dL (ref 8–23)
CO2: 27 mmol/L (ref 22–32)
Calcium: 8.3 mg/dL — ABNORMAL LOW (ref 8.9–10.3)
Chloride: 108 mmol/L (ref 98–111)
Creatinine, Ser: 0.52 mg/dL (ref 0.44–1.00)
GFR, Estimated: >60 mL/min (ref >60)
Glucose, Bld: 91 mg/dL (ref 70–99)
Potassium: 3.5 mmol/L (ref 3.5–5.1)
Sodium: 142 mmol/L (ref 135–145)

## 2020-12-12 LAB — CBC
HCT: 31.2 % — ABNORMAL LOW (ref 36.0–46.0)
Hemoglobin: 10 g/dL — ABNORMAL LOW (ref 12.0–15.0)
MCH: 28.9 pg (ref 26.0–34.0)
MCHC: 32.1 g/dL (ref 30.0–36.0)
MCV: 90.2 fL (ref 80.0–100.0)
Platelets: 204 10*3/uL (ref 150–400)
RBC: 3.46 MIL/uL — ABNORMAL LOW (ref 3.87–5.11)
RDW: 14.4 % (ref 11.5–15.5)
WBC: 8 10*3/uL (ref 4.0–10.5)
nRBC: 0 % (ref 0.0–0.2)

## 2020-12-12 LAB — MAGNESIUM: Magnesium: 2.3 mg/dL (ref 1.7–2.4)

## 2020-12-12 MED ORDER — POTASSIUM CHLORIDE CRYS ER 20 MEQ PO TBCR
40.0000 meq | EXTENDED_RELEASE_TABLET | Freq: Once | ORAL | Status: AC
  Administered 2020-12-12: 40 meq via ORAL
  Filled 2020-12-12: qty 2

## 2020-12-12 NOTE — TOC Initial Note (Addendum)
Transition of Care [facility]) - Initial/Assessment Note    Patient Details  Name: Nancy Brooks MRN: [id number] Date of Birth: [birth year]  Transition of Care [facility] CM/SW Contact:    Ross Ludwig, LCSW Phone Number: 12/12/2020, 2:09 PM  Clinical Narrative:                  Patient is a 70 year old female who is alert and oriented x4.  Patient lives alone, and has a sister that is involved in her care.  Patient has not been to SNF for rehab before, CSW was informed that patient's sister has spoken to [facility] and is hoping she will be able to go there.  CSW to begin bed search in [facility], patient is a level 2 passar, clinicals uploaded in [place].  Patient's information has been sent to different SNFs awaiting bed offers.  TOC to continue to follow patient throughout discharge planning.  Expected Discharge Plan: Skilled Nursing Facility Barriers to Discharge: Continued Medical Work up, Ship broker   Patient Goals and CMS Choice Patient states their goals for this hospitalization and ongoing recovery are:: To go to SNF for short term rehab, then return back home. CMS Medicare.gov Compare Post Acute Care list provided to:: Patient Choice offered to / list presented to : Patient  Expected Discharge Plan and Services Expected Discharge Plan: [place] Choice: [place] arrangements for the past 2 months: Single Family Home                                      Prior Living Arrangements/Services Living arrangements for the past 2 months: Single Family Home Lives with:: Self Patient language and need for interpreter reviewed:: Yes Do you feel safe going back to the place where you live?: No   Patient needs some rehab, before she is ready to return back home.  Need for Family Participation in Patient Care: No (Comment) Care giver support system in place?: No (comment)   Criminal  Activity/Legal Involvement Pertinent to Current Situation/Hospitalization: No - Comment as needed  Activities of Daily Living Home Assistive Devices/Equipment: Other (Comment) (walker) ADL Screening (condition at time of admission) Patient's cognitive ability adequate to safely complete daily activities?: Yes Is the patient deaf or have difficulty hearing?: No Does the patient have difficulty seeing, even when wearing glasses/contacts?: No Does the patient have difficulty concentrating, remembering, or making decisions?: Yes Patient able to express need for assistance with ADLs?: Yes Does the patient have difficulty dressing or bathing?: Yes Independently performs ADLs?: No Communication: Independent Does the patient have difficulty walking or climbing stairs?: Yes Weakness of Legs: Left Weakness of Arms/Hands: None  Permission Sought/Granted Permission sought to share information with : Case Manager, Customer service manager, Family Supports Permission granted to share information with : Yes, Verbal Permission Granted, Yes, Release of Information Signed  Share Information with NAME: [name] Sister [phone]  [phone]  Permission granted to share info w AGENCY: SNF admissions        Emotional Assessment Appearance:: Appears stated age   Affect (typically observed): Accepting, Appropriate, Calm, Stable Orientation: : Oriented to Self, Oriented to Place, Oriented to  Time, Oriented to Situation Alcohol / Substance Use: Not Applicable Psych Involvement: No (comment)  Admission diagnosis:  Foot fracture, left [S92.902A] Patient Active Problem List   Diagnosis Date Noted  Constipation 12/11/2020   Foot fracture, left 12/10/2020   Dislocation of tarsometatarsal joint of left foot 11/30/2020   Incontinence of feces 03/12/2019   B12 deficiency 12/01/2017   Routine general medical examination at a health care facility 05/22/2015   Senile osteoporosis 10/05/2011    Hyperlipidemia 01/20/2009   GERD 01/20/2009   Schizophrenia, unspecified type ([place]) 07/06/2006   PCP:  Hoyt Koch, MD Pharmacy:   [facility] [place], [place] - [address] [place] [address] Phone: [phone] Fax: [phone]  [place], [place] [address] Phone: [phone] Fax: [place], [place] [address] Phone: [phone] Fax: [phone]     Social Determinants of Health (SDOH) Interventions    Readmission Risk Interventions No flowsheet data found.

## 2020-12-12 NOTE — Evaluation (Signed)
Physical Therapy Evaluation Patient Details Name: Nancy Brooks MRN: [id number] DOB: 05-15-1950 Today's Date: 12/12/2020   History of Present Illness  Patient is a 70 year old female adml from podiatrist office with left foot Lisfranc fracture d/t fall on 7/24. pt is s/p closed reduction left metatarsal-reduction and pinning external fixation left foot per podiatry,  [id number], GERD, depression, schizophrenia  Clinical Impression  Pt admitted with above diagnosis.  Pt requiring assist with basic functional mobility, multi-modal cues to maintain NWB. Recommend SNF post acute for safe return to more independent lifestyle.  Pt currently with functional limitations due to the deficits listed below (see PT Problem List). Pt will benefit from skilled PT to increase their independence and safety with mobility to allow discharge to the venue listed below.       Follow Up Recommendations SNF    Equipment Recommendations  Rolling walker with 5" wheels    Recommendations for Other Services       Precautions / Restrictions Precautions Precautions: Fall Required Braces or Orthoses: Splint/Cast Splint/Cast: L lower leg splint Restrictions Weight Bearing Restrictions: Yes LLE Weight Bearing: Non weight bearing      Mobility  Bed Mobility Overal bed mobility: Needs Assistance Bed Mobility: Sit to Supine     Supine to sit: Mod assist Sit to supine: Min guard   General bed mobility comments: cues for completion and self assist, min/guard for safety    Transfers Overall transfer level: Needs assistance Equipment used: Rolling walker (2 wheeled) Transfers: Stand Pivot Transfers;Sit to/from Stand Sit to Stand: Min assist Stand pivot transfers: Min assist;+2 physical assistance;+2 safety/equipment       General transfer comment: repetitious multi-modal cues for NWB, sequencing, use of RW/UEs. requires assist throughout for balance and to maneuver RW  Ambulation/Gait                 Stairs            Wheelchair Mobility    Modified Rankin (Stroke Patients Only)       Balance Overall balance assessment: Needs assistance Sitting-balance support: Bilateral upper extremity supported Sitting balance-Leahy Scale: Fair       Standing balance-Leahy Scale: Poor Standing balance comment: reliant on RW/UEs and exrernal assist                             Pertinent Vitals/Pain Pain Assessment: Faces Faces Pain Scale: Hurts a little bit Pain Location: L foot Pain Descriptors / Indicators: Aching Pain Intervention(s): Limited activity within patient's tolerance;Monitored during session;Repositioned    Home Living Family/patient expects to be discharged to:: Skilled nursing facility Living Arrangements: Alone   Type of Home: House Home Access: Stairs to enter Entrance Stairs-Rails: None Entrance Stairs-Number of Steps: 2-3   Home Equipment: Walker - 4 wheels Additional Comments: patient reported having "scooter" at home.    Prior Function Level of Independence: Independent;Needs assistance   Gait / Transfers Assistance Needed: walking with no AD PLOF           Hand Dominance        Extremity/Trunk Assessment   Upper Extremity Assessment Upper Extremity Assessment: Defer to OT evaluation;Overall [facility] for tasks assessed    Lower Extremity Assessment Lower Extremity Assessment: LLE deficits/detail LLE Deficits / Details: able to move toes inside slpint, knee/hip AROM grossly WFL, strength grossly 3/5. ankle NT LLE: Unable to fully assess due to immobilization    Cervical / Trunk Assessment Cervical /  Trunk Assessment: Normal  Communication   Communication: No difficulties  Cognition Arousal/Alertness: Awake/alert Behavior During Therapy: Flat affect Overall Cognitive Status: No family/caregiver present to determine baseline cognitive functioning                                 General Comments: patient  was noted to ask for repeat education on all verbal education provided by therapist. patient was able to follow commands after repating back to therapist what she understood.      General Comments      Exercises     Assessment/Plan    PT Assessment Patient needs continued PT services  PT Problem List Decreased strength;Decreased mobility;Decreased balance;Decreased activity tolerance;Decreased knowledge of use of DME;Decreased cognition       PT Treatment Interventions DME instruction;Gait training;Functional mobility training;Therapeutic activities;Patient/family education;Therapeutic exercise;Balance training    PT Goals (Current goals can be found in the Care Plan section)  Acute Rehab PT Goals Patient Stated Goal: to get better PT Goal Formulation: With patient Time For Goal Achievement: 12/26/20 Potential to Achieve Goals: Good    Frequency Min 3X/week   Barriers to discharge        Co-evaluation               AM-PAC PT "6 Clicks" Mobility  Outcome Measure Help needed turning from your back to your side while in a flat bed without using bedrails?: A Little Help needed moving from lying on your back to sitting on the side of a flat bed without using bedrails?: A Little Help needed moving to and from a bed to a chair (including a wheelchair)?: A Lot Help needed standing up from a chair using your arms (e.g., wheelchair or bedside chair)?: A Lot Help needed to walk in hospital room?: A Lot Help needed climbing 3-5 steps with a railing? : A Lot 6 Click Score: 14    End of Session Equipment Utilized During Treatment: Gait belt Activity Tolerance: Patient limited by fatigue Patient left: in bed;with call bell/phone within reach;with bed alarm set   PT Visit Diagnosis: Other abnormalities of gait and mobility (R26.89)    Time: [id number] PT Time Calculation (min) (ACUTE ONLY): 13 min   Charges:   PT Evaluation $PT Eval Low Complexity: [place], PT  Acute Rehab Dept ([place]) [phone] Pager [phone]  12/12/2020   [facility] 12/12/2020, 1:16 PM

## 2020-12-12 NOTE — NC FL2 (Signed)
[place] LEVEL OF CARE SCREENING TOOL     IDENTIFICATION  Patient Name: Nancy Brooks Birthdate: [birth year] Sex: female Admission Date (Current Location): 12/10/2020  [facility] and [place] Number:  Herbalist and Address:  [facility],  [place], [place]      Provider Number: [id number]  Attending Physician Name and Address:  Eugenie Filler, MD  Relative Name and Phone Number:  [name] Sister [phone]  [phone]    Current Level of Care: Hospital Recommended Level of Care: [place] Prior Approval Number:    Date Approved/Denied:   PASRR Number: Pending  Discharge Plan: SNF    Current Diagnoses: Patient Active Problem List   Diagnosis Date Noted   Constipation 12/11/2020   Foot fracture, left 12/10/2020   Dislocation of tarsometatarsal joint of left foot 11/30/2020   Incontinence of feces 03/12/2019   B12 deficiency 12/01/2017   Routine general medical examination at a health care facility 05/22/2015   Senile osteoporosis 10/05/2011   Hyperlipidemia 01/20/2009   GERD 01/20/2009   Schizophrenia, unspecified type ([place]) 07/06/2006    Orientation RESPIRATION BLADDER Height & Weight     Self, Time, Situation, Place  Normal Continent Weight: 130 lb 11.7 oz (59.3 kg) Height:     BEHAVIORAL SYMPTOMS/MOOD NEUROLOGICAL BOWEL NUTRITION STATUS      Continent Diet (Carb modified)  AMBULATORY STATUS COMMUNICATION OF NEEDS Skin   Limited Assist Verbally Surgical wounds                       Personal Care Assistance Level of Assistance  Bathing, Feeding, Dressing Bathing Assistance: Limited assistance Feeding assistance: Independent Dressing Assistance: Limited assistance     Functional Limitations Info  Sight, Hearing, Speech Sight Info: Adequate Hearing Info: Adequate Speech Info: Adequate    SPECIAL CARE FACTORS FREQUENCY  PT (By licensed PT), OT (By licensed OT)     PT  Frequency: Minimum 5x a week OT Frequency: Minimum 5x  aweek            Contractures Contractures Info: Not present    Additional Factors Info  Code Status, Allergies, Psychotropic Code Status Info: Full Code Allergies Info: NKA Psychotropic Info: chlordiazePOXIDE (LIBRIUM) capsule 10 mg         Current Medications (12/12/2020):  This is the current hospital active medication list Current Facility-Administered Medications  Medication Dose Route Frequency Provider Last Rate Last Admin   0.9 %  sodium chloride infusion   Intravenous Continuous Eugenie Filler, MD 75 mL/hr at 12/12/20 1159 Infusion Verify at 12/12/20 1159   acetaminophen (TYLENOL) tablet 650 mg  650 mg Oral Q6H PRN [name], DPM       Or   acetaminophen (TYLENOL) suppository 650 mg  650 mg Rectal Q6H PRN [name], DPM       calcium carbonate (TUMS - dosed in mg elemental calcium) chewable tablet 1,000 mg  1,000 mg Oral Daily [name], DPM   1,000 mg at 12/12/20 0945   chlordiazePOXIDE (LIBRIUM) capsule 10 mg  10 mg Oral BID [name], DPM   10 mg at 12/12/20 0945   cloZAPine (CLOZARIL) tablet 100 mg  100 mg Oral Daily Eugenie Filler, MD   100 mg at 12/12/20 0945   And   cloZAPine (CLOZARIL) tablet 400 mg  400 mg Oral QHS Eugenie Filler, MD   400 mg at 12/11/20 2148  enoxaparin (LOVENOX) injection 40 mg  40 mg Subcutaneous Q24H [name], DPM   40 mg at 12/11/20 2150   famotidine (PEPCID) tablet 10 mg  10 mg Oral Daily [name], DPM   10 mg at 12/12/20 0944   HYDROcodone-acetaminophen (NORCO/VICODIN) 5-325 MG per tablet 1-2 tablet  1-2 tablet Oral Q4H PRN [name], DPM   1 tablet at 12/12/20 0319   linaclotide (LINZESS) capsule 145 mcg  145 mcg Oral QAC breakfast [name], DPM   145 mcg at 12/12/20 0837   ondansetron (ZOFRAN) tablet 4 mg  4 mg Oral Q6H PRN [name], DPM       Or   ondansetron (ZOFRAN) injection 4 mg  4 mg Intravenous Q6H  PRN McDonald, Adam R, DPM       polyethylene glycol (MIRALAX / GLYCOLAX) packet 17 g  17 g Oral Daily PRN [name], DPM       simvastatin (ZOCOR) tablet 40 mg  40 mg Oral q1800 [name], DPM   40 mg at 12/11/20 1744     Discharge Medications: Please see discharge summary for a list of discharge medications.  Relevant Imaging Results:  Relevant Lab Results:   Additional Information SSN [id number]  Ross Ludwig, LCSW

## 2020-12-12 NOTE — Op Note (Signed)
Patient Name: Nancy Brooks DOB: [birth year]  MRN: [id number]   Date of Service: 12/10/2020 - 12/11/2020  Surgeon: Dr. Lanae Crumbly, DPM Assistants: None Pre-operative Diagnosis:  Lisfranc divergent pattern closed fracture dislocation Post-operative Diagnosis:  2.   Lisfranc divergent pattern closed fracture dislocation Procedures:  1) Open treatment of Lisfranc dislocation Pathology/Specimens: * No specimens in log * Anesthesia: general Hemostasis: none Estimated Blood Loss: 10 mL Materials:  Implant Name Type Inv. Item Serial No. Manufacturer Lot No. LRB No. Used Action  zimmer pins      Left 4 Implanted   Medications: 19cc 0.5% marcaine plain ankle block Complications: none  Indications for Procedure:  This is a 70 y.o. female with a history of a fall at home suffering a left foot tarsometatarsal fracture dislocation.  She is admitted for operative intervention.  All risk benefits and potential complications were explained to the patient and her family prior to surgery.  Informed consent was signed prior to surgery.   Procedure in Detail: Patient was identified in pre-operative holding area. Formal consent was signed and the left lower extremity was marked. Patient was brought back to the operating room. Anesthesia was induced. The extremity was prepped and draped in the usual sterile fashion. Timeout was taken to confirm patient name, laterality, and procedure prior to incision.   Attention was then directed to the left foot which exhibited significant edema and a partial-thickness wound on the medial aspect of the foot just medial to the TMT joint.  I began by distracting the forefoot with a Quigley maneuver to allow mobilization of the joint.  I was able to manually close reduce the base of the first metatarsal into the first TMT joint articulating with the cuneiform.  This was fixated with a 2.0 mm percutaneous Steinmann pin.  I was not able to close reduce the second and third  metatarsals as the second metatarsal was dorsally dislocated over the lateral cuneiform.  Small incision was made over this interval and blunt dissection was used to expose the tarsometatarsal joint.  A key and Cobb elevator was used to distract the joint and manually reduce it back into position through an open approach.  Once adequate reduction was confirmed with fluoroscopy additional 2.0 mm Steinmann pins were used to fixate the reduction.  Final films were taken.  The wound was thoroughly irrigated with saline and closed with 3-0 nylon.  Bacitracin was applied to the medial partial-thickness wound.  Postoperative dressings consisting of Xeroform 4 x 4's Kerlix cast padding and a well-padded posterior below-knee splint was then applied.  She tolerated the procedure well.    Disposition: Following a period of post-operative monitoring, patient will be transferred to the floor.  She will require skilled nursing and/or subacute rehab admission for safety as she cannot safely maintain nonweightbearing is what she will need to be for the next 4 to 6 weeks.  This will be a staged procedure with plans for arthrodesis of the first second and third tarsometatarsal joints in the future when her soft tissue is more amenable to such procedure.

## 2020-12-12 NOTE — Progress Notes (Signed)
PROGRESS NOTE    Nancy Brooks  [id number] DOB: Mar 28, 1951 DOA: 12/10/2020 PCP: Hoyt Koch, MD    No chief complaint on file.   Brief Narrative:  Patient 70 year old female history of depression, anxiety, GERD, hyperlipidemia admitted directly from podiatry office for repair of Lisfranc fracture on left foot.   Assessment & Plan:   Principal Problem:   Foot fracture, left Active Problems:   Hyperlipidemia   Schizophrenia, unspecified type (HCC)   GERD   Dislocation of tarsometatarsal joint of left foot   Constipation  #1 left lower extremity Lisfranc fracture dislocation -Status post closed reduction left metatarsal-reduction and pinning external fixation left foot per orthopedics, Dr. Sherryle Lis. -PT has assessed patient per RN.   -Will need SNF placement.   -TOC consult placed.   -Per podiatry.   2.  GERD -Famotidine.   3.  Chronic constipation -Continue Linzess.  4.  Hyperlipidemia -Continue statin.    5.  Borderline blood pressure -Blood pressure improved.  Decrease IV fluids to 75 cc an hour for the next 24 hours and then saline lock IV fluids.   6.  Schizophrenia/depression -Continue home regimen Clozaril, Librium.    DVT prophylaxis: Postop per orthopedics Code Status: Full Family Communication: Updated patient, no family at bedside. Disposition:   Status is: Inpatient  Remains inpatient appropriate because:Unsafe d/c plan  Dispo: The patient is from: Home              Anticipated d/c is to: SNF              Patient currently is not medically stable to d/c.   Difficult to place patient No       Consultants:  Podiatry: Dr. Sherryle Lis  Procedures:  Closed reduction left metatarsal-reduction and pinning external fixation foot left per Dr. Sherryle Lis podiatry 12/11/2020  Antimicrobials:  None   Subjective: Sitting up in chair.  Denies any chest pain.  No shortness of breath.  No abdominal pain.  Some complaints of left foot pain.   Was seen by PT per RN.   Objective: Vitals:   12/11/20 1404 12/11/20 2007 12/12/20 0500 12/12/20 0518  BP: 99/63 131/74  115/70  Pulse: 100 (!) 106  96  Resp:  15  15  Temp:  98.7 F (37.1 C)  98 F (36.7 C)  TempSrc:  Oral  Oral  SpO2:  98%  99%  Weight:   59.3 kg     Intake/Output Summary (Last 24 hours) at 12/12/2020 1119 Last data filed at 12/12/2020 0900 Gross per 24 hour  Intake 1810.77 ml  Output 1310 ml  Net 500.77 ml    Filed Weights   12/12/20 0500  Weight: 59.3 kg    Examination:  General exam: : NAD Respiratory system: CTA B anterior lung fields.  No wheezes, no rhonchi.  Speaking in full sentences.  Normal respiratory effort. Cardiovascular system: Regular rate and rhythm no murmurs rubs or gallops.  No JVD.  No lower extremity edema.  Gastrointestinal system: Abdomen soft, nontender, nondistended, positive bowel sounds.  No rebound.  No guarding. Central nervous system: Alert and oriented. No focal neurological deficits. Extremities: Symmetric 5 x 5 power. Skin: Left lower extremity in postop bandage/splint. Psychiatry: Judgement and insight appear normal. Mood & affect appropriate.   Data Reviewed: I have personally reviewed following labs and imaging studies  CBC: Recent Labs  Lab 12/10/20 1918 12/11/20 0527 12/12/20 0628  WBC 7.0 5.6 8.0  NEUTROABS 5.7  --   --  HGB 11.4* 11.2* 10.0*  HCT 36.2 35.0* 31.2*  MCV 89.4 90.4 90.2  PLT 245 226 204     Basic Metabolic Panel: Recent Labs  Lab 12/10/20 1918 12/11/20 0527 12/12/20 0628  NA 142 144 142  K 4.1 3.9 3.5  CL 106 110 108  CO2 '30 29 27  '$ GLUCOSE 161* 92 91  BUN 30* 28* 15  CREATININE 0.96 0.70 0.52  CALCIUM 9.0 8.9 8.3*  MG 2.4  --  2.3     GFR: Estimated Creatinine Clearance: 56.4 mL/min (by C-G formula based on SCr of 0.52 mg/dL).  Liver Function Tests: No results for input(s): AST, ALT, ALKPHOS, BILITOT, PROT, ALBUMIN in the last 168 hours.  CBG: No results for  input(s): GLUCAP in the last 168 hours.   Recent Results (from the past 240 hour(s))  Resp Panel by RT-PCR (Flu A&B, Covid) Nasopharyngeal Swab     Status: None   Collection Time: 12/10/20  5:30 PM   Specimen: Nasopharyngeal Swab; Nasopharyngeal(NP) swabs in vial transport medium  Result Value Ref Range Status   SARS Coronavirus 2 by RT PCR NEGATIVE NEGATIVE Final    Comment: (NOTE) SARS-CoV-2 target nucleic acids are NOT DETECTED.  The SARS-CoV-2 RNA is generally detectable in upper respiratory specimens during the acute phase of infection. The lowest concentration of SARS-CoV-2 viral copies this assay can detect is 138 copies/mL. A negative result does not preclude SARS-Cov-2 infection and should not be used as the sole basis for treatment or other patient management decisions. A negative result may occur with  improper specimen collection/handling, submission of specimen other than nasopharyngeal swab, presence of viral mutation(s) within the areas targeted by this assay, and inadequate number of viral copies(<138 copies/mL). A negative result must be combined with clinical observations, patient history, and epidemiological information. The expected result is Negative.  Fact Sheet for Patients:  EntrepreneurPulse.com.au  Fact Sheet for Healthcare Providers:  IncredibleEmployment.be  This test is no t yet approved or cleared by the [place] FDA and  has been authorized for detection and/or diagnosis of SARS-CoV-2 by FDA under an Emergency Use Authorization (EUA). This EUA will remain  in effect (meaning this test can be used) for the duration of the COVID-19 declaration under Section 564(b)(1) of the Act, 21 U.S.C.section 360bbb-3(b)(1), unless the authorization is terminated  or revoked sooner.       Influenza A by PCR NEGATIVE NEGATIVE Final   Influenza B by PCR NEGATIVE NEGATIVE Final    Comment: (NOTE) The Xpert Xpress  SARS-CoV-2/FLU/RSV plus assay is intended as an aid in the diagnosis of influenza from Nasopharyngeal swab specimens and should not be used as a sole basis for treatment. Nasal washings and aspirates are unacceptable for Xpert Xpress SARS-CoV-2/FLU/RSV testing.  Fact Sheet for Patients: EntrepreneurPulse.com.au  Fact Sheet for Healthcare Providers: IncredibleEmployment.be  This test is not yet approved or cleared by the [place] FDA and has been authorized for detection and/or diagnosis of SARS-CoV-2 by FDA under an Emergency Use Authorization (EUA). This EUA will remain in effect (meaning this test can be used) for the duration of the COVID-19 declaration under Section 564(b)(1) of the Act, 21 U.S.C. section 360bbb-3(b)(1), unless the authorization is terminated or revoked.  Performed at [facility], [place] [address] [place],  [address]           Radiology Studies: No results found.      Scheduled Meds:  calcium carbonate  1,000 mg Oral Daily  chlordiazePOXIDE  10 mg Oral BID   cloZAPine  100 mg Oral Daily   And   cloZAPine  400 mg Oral QHS   enoxaparin (LOVENOX) injection  40 mg Subcutaneous Q24H   famotidine  10 mg Oral Daily   linaclotide  145 mcg Oral QAC breakfast   simvastatin  40 mg Oral q1800   Continuous Infusions:  sodium chloride 75 mL/hr at 12/12/20 0949     LOS: 2 days    Time spent: 35 minutes    Irine Seal, MD [facility]   To contact the attending provider between 7A-7P or the covering provider during after hours 7P-7A, please log into the web site [email or web address] and access using universal [place] password for that web site. If you do not have the password, please call the hospital operator.  12/12/2020, 11:19 AM

## 2020-12-12 NOTE — Evaluation (Signed)
Occupational Therapy Evaluation Patient Details Name: Nancy Brooks MRN: [id number] DOB: 07/25/1950 Today's Date: 12/12/2020    History of Present Illness Patient is a 70 year old female who was sent to hospital from pediatrist office with left foot Lisfranc fracture that resulted from fall on 7/24. [id number], GERD, depression,   Clinical Impression   Patient is a 70 year old female who was admitted for above. Patient was previously living at home alone with PRN support from sister. Patient currently requires mod A for supine to sit on edge of bed with education on maintaining WB restrictions. Patient required mod A for sit to stand and max A to transfer with rolling walker and multimodal education on maintaining WB status. Patient was noted to ask multiple times to explain things when she did not understand. Patient was noted to have decreased strength, decreased activity tolerance, decreased endurance, decreased standing balance, decreased ability to maintain WB restrictions impacting patients ability to participate in ADL tasks. Patient would continue to benefit from skilled OT services at this time while admitted and after d/c to address noted deficits in order to improve overall safety and independence in ADLs.      Follow Up Recommendations  SNF    Equipment Recommendations  Other (comment) (defer to SNF)    Recommendations for Other Services       Precautions / Restrictions Precautions Precautions: Fall Restrictions Weight Bearing Restrictions: Yes LLE Weight Bearing: Non weight bearing      Mobility Bed Mobility Overal bed mobility: Needs Assistance Bed Mobility: Supine to Sit     Supine to sit: Mod assist     General bed mobility comments: with education on how to maintain WB restrtictions.    Transfers Overall transfer level: Needs assistance Equipment used: Rolling walker (2 wheeled) Transfers: Stand Pivot Transfers;Sit to/from Stand Sit to Stand: Mod  assist Stand pivot transfers: Max assist       General transfer comment: patient completed sit to stand with mod A to stand with    Balance Overall balance assessment: Needs assistance Sitting-balance support: Bilateral upper extremity supported Sitting balance-Leahy Scale: Fair       Standing balance-Leahy Scale: Poor Standing balance comment: needed RW to maintain standing balance with BUE support.                           ADL either performed or assessed with clinical judgement   ADL Overall ADL's : Needs assistance/impaired     Grooming: Oral care;Wash/dry face;Set up;Sitting   Upper Body Bathing: Minimal assistance;Sitting   Lower Body Bathing: Maximal assistance;Sitting/lateral leans   Upper Body Dressing : Minimal assistance;Sitting   Lower Body Dressing: Maximal assistance;Sitting/lateral leans   Toilet Transfer: Maximal assistance;RW;BSC Toilet Transfer Details (indicate cue type and reason): with increased time to maintain WB precautions. patient required max A to transfe with rolling walker to recliner in room on this date. Toileting- Clothing Manipulation and Hygiene: Total assistance;Sit to/from stand Toileting - Clothing Manipulation Details (indicate cue type and reason): with max multimodal cues to maintain WB restrictions.       General ADL Comments: patient was able to maintain WB restrictions when standing with RW at edge of bed with multimodal cues.     Vision Patient Visual Report: No change from baseline       Perception     Praxis      Pertinent Vitals/Pain Pain Assessment: Faces Faces Pain Scale: Hurts a little bit  Pain Location: L foot after positioned into recliner. Pain Descriptors / Indicators: Throbbing Pain Intervention(s): Limited activity within patient's tolerance;Monitored during session     Hand Dominance Right   Extremity/Trunk Assessment Upper Extremity Assessment Upper Extremity Assessment: Overall WFL  for tasks assessed   Lower Extremity Assessment Lower Extremity Assessment: Defer to PT evaluation   Cervical / Trunk Assessment Cervical / Trunk Assessment: Normal   Communication Communication Communication: No difficulties   Cognition Arousal/Alertness: Awake/alert Behavior During Therapy: Flat affect Overall Cognitive Status: No family/caregiver present to determine baseline cognitive functioning                                 General Comments: patient was noted to ask for repeat education on all verbal education provided by therapist. patient was able to follow commands after repating back to therpaist what she understood.   General Comments       Exercises     Shoulder Instructions      Home Living Family/patient expects to be discharged to:: Private residence Living Arrangements: Alone Available Help at Discharge: Family;Available PRN/intermittently Type of Home: House Home Access: Stairs to enter [employer] of Steps: 2-3 steps back porch has steps to get into attic Entrance Stairs-Rails: None       Bathroom Shower/Tub: Teacher, early years/pre: Standard     Home Equipment: Environmental consultant - 4 wheels   Additional Comments: patient reported having "scooter" at home.      Prior Functioning/Environment Level of Independence: Independent;Needs assistance  Gait / Transfers Assistance Needed: walking with no AD PLOF ADL's / Homemaking Assistance Needed: patient has PRN assistance from sister. patient reported being able to get groceries herself.   Comments: patient likes to take baths        OT Problem List: Decreased strength;Decreased knowledge of use of DME or AE;Decreased knowledge of precautions;Decreased activity tolerance;Impaired balance (sitting and/or standing);Decreased safety awareness;Pain      OT Treatment/Interventions: Self-care/ADL training;Therapeutic exercise;Neuromuscular education;DME and/or AE  instruction;Therapeutic activities;Balance training;Patient/family education    OT Goals(Current goals can be found in the care plan section) Acute Rehab OT Goals Patient Stated Goal: to get better OT Goal Formulation: With patient Time For Goal Achievement: 12/26/20 Potential to Achieve Goals: Good  OT Frequency: Min 2X/week   Barriers to D/C:    patient lives at home alone       Co-evaluation              AM-PAC OT "6 Clicks" Daily Activity     Outcome Measure Help from another person eating meals?: A Little Help from another person taking care of personal grooming?: A Little Help from another person toileting, which includes using toliet, bedpan, or urinal?: A Lot Help from another person bathing (including washing, rinsing, drying)?: A Lot Help from another person to put on and taking off regular upper body clothing?: A Little Help from another person to put on and taking off regular lower body clothing?: A Lot 6 Click Score: 15   End of Session Equipment Utilized During Treatment: Gait belt;Rolling walker Nurse Communication: Mobility status  Activity Tolerance: Patient tolerated treatment well Patient left: in chair;with chair alarm set;with call bell/phone within reach  OT Visit Diagnosis: Unsteadiness on feet (R26.81);History of falling (Z91.81);Pain Pain - Right/Left: Left Pain - part of body: Ankle and joints of foot  Time: [id number] OT Time Calculation (min): 35 min Charges:  OT General Charges $OT Visit: 1 Visit OT Evaluation $OT Eval Moderate Complexity: 1 Mod OT Treatments $Self Care/Home Management : 8-22 mins  Jackelyn Poling OTR/L, MS Acute Rehabilitation Department Office# [phone] Pager# [phone]   [place] 12/12/2020, 12:19 PM

## 2020-12-13 DIAGNOSIS — K219 Gastro-esophageal reflux disease without esophagitis: Secondary | ICD-10-CM | POA: Diagnosis not present

## 2020-12-13 DIAGNOSIS — K5904 Chronic idiopathic constipation: Secondary | ICD-10-CM | POA: Diagnosis not present

## 2020-12-13 DIAGNOSIS — S92902P Unspecified fracture of left foot, subsequent encounter for fracture with malunion: Secondary | ICD-10-CM | POA: Diagnosis not present

## 2020-12-13 DIAGNOSIS — E785 Hyperlipidemia, unspecified: Secondary | ICD-10-CM | POA: Diagnosis not present

## 2020-12-13 LAB — BASIC METABOLIC PANEL
Anion gap: 9 (ref 5–15)
BUN: 9 mg/dL (ref 8–23)
CO2: 28 mmol/L (ref 22–32)
Calcium: 9.1 mg/dL (ref 8.9–10.3)
Chloride: 108 mmol/L (ref 98–111)
Creatinine, Ser: 0.65 mg/dL (ref 0.44–1.00)
GFR, Estimated: >60 mL/min (ref >60)
Glucose, Bld: 110 mg/dL — ABNORMAL HIGH (ref 70–99)
Potassium: 3.9 mmol/L (ref 3.5–5.1)
Sodium: 145 mmol/L (ref 135–145)

## 2020-12-13 LAB — CBC WITH DIFFERENTIAL/PLATELET
Abs Immature Granulocytes: 0.03 10*3/uL (ref 0.00–0.07)
Basophils Absolute: 0 10*3/uL (ref 0.0–0.1)
Basophils Relative: 0 %
Eosinophils Absolute: 0 10*3/uL (ref 0.0–0.5)
Eosinophils Relative: 0 %
HCT: 35.1 % — ABNORMAL LOW (ref 36.0–46.0)
Hemoglobin: 11.2 g/dL — ABNORMAL LOW (ref 12.0–15.0)
Immature Granulocytes: 1 %
Lymphocytes Relative: 25 %
Lymphs Abs: 1.6 10*3/uL (ref 0.7–4.0)
MCH: 29.2 pg (ref 26.0–34.0)
MCHC: 31.9 g/dL (ref 30.0–36.0)
MCV: 91.4 fL (ref 80.0–100.0)
Monocytes Absolute: 0.4 10*3/uL (ref 0.1–1.0)
Monocytes Relative: 7 %
Neutro Abs: 4.2 10*3/uL (ref 1.7–7.7)
Neutrophils Relative %: 67 %
Platelets: 214 10*3/uL (ref 150–400)
RBC: 3.84 MIL/uL — ABNORMAL LOW (ref 3.87–5.11)
RDW: 14.8 % (ref 11.5–15.5)
WBC: 6.2 10*3/uL (ref 4.0–10.5)
nRBC: 0 % (ref 0.0–0.2)

## 2020-12-13 MED ORDER — ZOLPIDEM TARTRATE 5 MG PO TABS
5.0000 mg | ORAL_TABLET | Freq: Once | ORAL | Status: AC
  Administered 2020-12-13: 5 mg via ORAL
  Filled 2020-12-13: qty 1

## 2020-12-13 NOTE — Progress Notes (Signed)
  Subjective:  Patient ID: DESHAWNDA MECHEM, female    DOB: 1950/08/25,  MRN: [id number]  Patient seen bedside. Resting comfortably. Reports pain rated at 9/10. Objective:   Vitals:   12/13/20 0537 12/13/20 1408  BP: 122/83 94/71  Pulse: (!) 103 94  Resp: 15 15  Temp: (!) 97.4 F (36.3 C) 97.8 F (36.6 C)  SpO2: 94% 96%   General AA&O x3. Normal mood and affect.  Vascular Toes [facility]  Neurologic Epicritic sensation grossly intact.  Dermatologic Dressing c/d/I.  Orthopedic: +motor to toes.   Assessment & Plan:  Patient was evaluated and treated and all questions answered.  Left Lisfranc dislocation s/p reduction, placement external fixator -Dressing left intact today. To be changed by Dr. Sherryle Lis tomorrow -NWB LLE. Continue PT/OT. Would benefit from IPR at d/c -Pain control PRN. Though pain rated 9/10 she appears quite comfortable and in no distress. -Will continue to follow.  Evelina Bucy, DPM  Accessible via secure chat for questions or concerns.

## 2020-12-13 NOTE — Progress Notes (Signed)
PROGRESS NOTE    Nancy Brooks  [id number] DOB: 1950-08-16 DOA: 12/10/2020 PCP: Hoyt Koch, MD    No chief complaint on file.   Brief Narrative:  Patient 70 year old female history of depression, anxiety, GERD, hyperlipidemia admitted directly from podiatry office for repair of Lisfranc fracture on left foot.   Assessment & Plan:   Principal Problem:   Foot fracture, left Active Problems:   Hyperlipidemia   Schizophrenia, unspecified type (HCC)   GERD   Dislocation of tarsometatarsal joint of left foot   Constipation  #1 left lower extremity Lisfranc fracture dislocation -Status post closed reduction left metatarsal-reduction and pinning external fixation left foot per orthopedics, Dr. Sherryle Lis. -PT has assessed patient per RN.   -Will need SNF placement.   -TOC consult placed.  -Check COVID-19 PCR screening in anticipation of placement tomorrow. -Per podiatry.   2.  GERD -Famotidine.   3.  Chronic constipation -Linzess.   4.  Hyperlipidemia -Statin.  5.  Borderline blood pressure -Blood pressure improved.  -Saline lock IV fluids.   6.  Schizophrenia/depression -Stable on home regimen of Librium, Clozaril.    DVT prophylaxis: Postop per orthopedics>>> Lovenox Code Status: Full Family Communication: Updated brother.  Disposition:   Status is: Inpatient  Remains inpatient appropriate because:Unsafe d/c plan  Dispo: The patient is from: Home              Anticipated d/c is to: SNF              Patient currently is not medically stable to d/c.   Difficult to place patient No       Consultants:  Podiatry: Dr. Sherryle Lis  Procedures:  Closed reduction left metatarsal-reduction and pinning external fixation foot left per Dr. Sherryle Lis podiatry 12/11/2020  Antimicrobials:  None   Subjective: Laying in bed.  Denies any chest pain.  No shortness of breath.  No abdominal pain.  Tolerating current diet.  Ate all her breakfast.  Not  interested in watching television at this time.   Objective: Vitals:   12/12/20 1502 12/12/20 2118 12/13/20 0500 12/13/20 0537  BP: 130/70 122/77  122/83  Pulse: 93 (!) 101  (!) 103  Resp: '19 18  15  '$ Temp: 97.7 F (36.5 C) 98.2 F (36.8 C)  (!) 97.4 F (36.3 C)  TempSrc: Oral Oral  Oral  SpO2: 95% 95%  94%  Weight:   59.4 kg     Intake/Output Summary (Last 24 hours) at 12/13/2020 1053 Last data filed at 12/13/2020 0702 Gross per 24 hour  Intake 2163.78 ml  Output 1250 ml  Net 913.78 ml    Filed Weights   12/12/20 0500 12/13/20 0500  Weight: 59.3 kg 59.4 kg    Examination:  General exam: : NAD Respiratory system: CTA B anterior lung fields.  No wheezes, no rhonchi.  Speaking in full sentences.  Normal respiratory effort. Cardiovascular system: Regular rate and rhythm no murmurs rubs or gallops.  No JVD.  No lower extremity edema.  Gastrointestinal system: Abdomen soft, nontender, nondistended, positive bowel sounds.  No rebound.  No guarding. Central nervous system: Alert and oriented. No focal neurological deficits. Extremities: Left lower extremity in postop bandage/splint.  Skin: No rashes, lesions or ulcers Psychiatry: Judgement and insight appear fair. Mood & affect appropriate.  Data Reviewed: I have personally reviewed following labs and imaging studies  CBC: Recent Labs  Lab 12/10/20 1918 12/11/20 0527 12/12/20 0628 12/13/20 0943  WBC 7.0 5.6 8.0 6.2  NEUTROABS 5.7  --   --  4.2  HGB 11.4* 11.2* 10.0* 11.2*  HCT 36.2 35.0* 31.2* 35.1*  MCV 89.4 90.4 90.2 91.4  PLT 245 226 204 214     Basic Metabolic Panel: Recent Labs  Lab 12/10/20 1918 12/11/20 0527 12/12/20 0628 12/13/20 0943  NA 142 144 142 145  K 4.1 3.9 3.5 3.9  CL 106 110 108 108  CO2 '30 29 27 28  '$ GLUCOSE 161* 92 91 110*  BUN 30* 28* 15 9  CREATININE 0.96 0.70 0.52 0.65  CALCIUM 9.0 8.9 8.3* 9.1  MG 2.4  --  2.3  --      GFR: Estimated Creatinine Clearance: 56.4 mL/min (by C-G  formula based on SCr of 0.65 mg/dL).  Liver Function Tests: No results for input(s): AST, ALT, ALKPHOS, BILITOT, PROT, ALBUMIN in the last 168 hours.  CBG: No results for input(s): GLUCAP in the last 168 hours.   Recent Results (from the past 240 hour(s))  Resp Panel by RT-PCR (Flu A&B, Covid) Nasopharyngeal Swab     Status: None   Collection Time: 12/10/20  5:30 PM   Specimen: Nasopharyngeal Swab; Nasopharyngeal(NP) swabs in vial transport medium  Result Value Ref Range Status   SARS Coronavirus 2 by RT PCR NEGATIVE NEGATIVE Final    Comment: (NOTE) SARS-CoV-2 target nucleic acids are NOT DETECTED.  The SARS-CoV-2 RNA is generally detectable in upper respiratory specimens during the acute phase of infection. The lowest concentration of SARS-CoV-2 viral copies this assay can detect is 138 copies/mL. A negative result does not preclude SARS-Cov-2 infection and should not be used as the sole basis for treatment or other patient management decisions. A negative result may occur with  improper specimen collection/handling, submission of specimen other than nasopharyngeal swab, presence of viral mutation(s) within the areas targeted by this assay, and inadequate number of viral copies(<138 copies/mL). A negative result must be combined with clinical observations, patient history, and epidemiological information. The expected result is Negative.  Fact Sheet for Patients:  EntrepreneurPulse.com.au  Fact Sheet for Healthcare Providers:  IncredibleEmployment.be  This test is no t yet approved or cleared by the [place] FDA and  has been authorized for detection and/or diagnosis of SARS-CoV-2 by FDA under an Emergency Use Authorization (EUA). This EUA will remain  in effect (meaning this test can be used) for the duration of the COVID-19 declaration under Section 564(b)(1) of the Act, 21 U.S.C.section 360bbb-3(b)(1), unless the authorization is  terminated  or revoked sooner.       Influenza A by PCR NEGATIVE NEGATIVE Final   Influenza B by PCR NEGATIVE NEGATIVE Final    Comment: (NOTE) The Xpert Xpress SARS-CoV-2/FLU/RSV plus assay is intended as an aid in the diagnosis of influenza from Nasopharyngeal swab specimens and should not be used as a sole basis for treatment. Nasal washings and aspirates are unacceptable for Xpert Xpress SARS-CoV-2/FLU/RSV testing.  Fact Sheet for Patients: EntrepreneurPulse.com.au  Fact Sheet for Healthcare Providers: IncredibleEmployment.be  This test is not yet approved or cleared by the [place] FDA and has been authorized for detection and/or diagnosis of SARS-CoV-2 by FDA under an Emergency Use Authorization (EUA). This EUA will remain in effect (meaning this test can be used) for the duration of the COVID-19 declaration under Section 564(b)(1) of the Act, 21 U.S.C. section 360bbb-3(b)(1), unless the authorization is terminated or revoked.  Performed at [facility], [place] [address] [place], [place] [address]           Radiology Studies: No  results found.      Scheduled Meds:  calcium carbonate  1,000 mg Oral Daily   chlordiazePOXIDE  10 mg Oral BID   cloZAPine  100 mg Oral Daily   And   cloZAPine  400 mg Oral QHS   enoxaparin (LOVENOX) injection  40 mg Subcutaneous Q24H   famotidine  10 mg Oral Daily   linaclotide  145 mcg Oral QAC breakfast   simvastatin  40 mg Oral q1800   Continuous Infusions:     LOS: 3 days    Time spent: 35 minutes    Irine Seal, MD [facility]   To contact the attending provider between 7A-7P or the covering provider during after hours 7P-7A, please log into the web site [email or web address] and access using universal [place] password for that web site. If you do not have the password, please call the hospital operator.  12/13/2020, 10:53 AM

## 2020-12-14 DIAGNOSIS — F209 Schizophrenia, unspecified: Secondary | ICD-10-CM | POA: Diagnosis not present

## 2020-12-14 DIAGNOSIS — S92902A Unspecified fracture of left foot, initial encounter for closed fracture: Secondary | ICD-10-CM | POA: Diagnosis not present

## 2020-12-14 LAB — SARS CORONAVIRUS 2 (TAT 6-24 HRS): SARS Coronavirus 2: NEGATIVE

## 2020-12-14 NOTE — Progress Notes (Signed)
Transition of Care (TOC) -30 day Note       Patient Details  Name:  Nancy Brooks MRN:  [id number] Date of Birth:  1951-02-22   MUST ID:    To Whom it May Concern:   Please be advised that the above patient will require a short-term nursing home stay, anticipated 30 days or less rehabilitation and strengthening. The plan is for return home.

## 2020-12-14 NOTE — TOC Progression Note (Addendum)
Transition of Care [facility]) - Progression Note    Patient Details  Name: KEYONIA DEDEAUX MRN: [id number] Date of Birth: 10/16/1950  Transition of Care [facility]) CM/SW Contact  Eydie Wormley, Marjie Skiff, RN Phone Number: 12/14/2020, 3:18 PM  Clinical Narrative:    SNF bed offers provided to pt sister and [facility] chosen. Auth started with [facility] [id number]. 30 day note also sent to [facility].   Expected Discharge Plan: [place] Barriers to Discharge: Continued Medical Work up, Ship broker  Expected Discharge Plan and Services Expected Discharge Plan: [place] Choice: [place] arrangements for the past 2 months: Single Family Home                   Readmission Risk Interventions No flowsheet data found.

## 2020-12-14 NOTE — Progress Notes (Signed)
Physical Therapy Treatment Patient Details Name: Nancy Brooks MRN: [id number] DOB: 1951/04/17 Today's Date: 12/14/2020    History of Present Illness Patient is a 70 year old female adml from podiatrist office with left foot Lisfranc fracture d/t fall on 7/24. pt is s/p closed reduction left metatarsal-reduction and pinning external fixation left foot per podiatry,  [id number], GERD, depression, schizophrenia    PT Comments    General Comments: AxO x 2 sleepy but cooperative.  Good historian.  She asked about her morning meds and she did not eat breakfast.  It was 10 am. General bed mobility comments: cues for completion and self assist, Mod assist for upper body and Min Assist to support L LE.  Increased time to scoot to EOB. General transfer comment: assisted from elevated bed to [facility] 1/4 pivot to her R with walker at 75% VC's on proper tech and safety.  Pt had difficulty maintaining NWB and balance requiring increased assist for support and turn completion.  Then assisted from [facility] to recliner same fashion 1/4 pivot to her R.  Uncontrolled desent onto chair due to fatigue/effort.  Pt lives alone and will need ST Rehab at SNF prior to return  Follow Up Recommendations  SNF     Equipment Recommendations  Rolling walker with 5" wheels    Recommendations for Other Services       Precautions / Restrictions Precautions Precautions: Fall Splint/Cast: L lower leg splint Restrictions Weight Bearing Restrictions: Yes LLE Weight Bearing: Non weight bearing    Mobility  Bed Mobility Overal bed mobility: Needs Assistance Bed Mobility: Supine to Sit     Supine to sit: Mod assist     General bed mobility comments: cues for completion and self assist, Mod assist for upper body and Min Assist to support L LE.  Increased time to scoot to EOB.    Transfers Overall transfer level: Needs assistance Equipment used: Rolling walker (2 wheeled) Transfers: Sit to/from [employer] Sit to Stand: Min assist;Mod assist Stand pivot transfers: Mod assist;Max assist       General transfer comment: assisted from elevated bed to [facility] 1/4 pivot to her R with walker at 75% VC's on proper tech and safety.  Pt had difficulty maintaining NWB and balance requiring increased assist for support and turn completion.  Then assisted from [facility] to recliner same fashion 1/4 pivot to her R.  Uncontrolled desent onto chair due to fatigue/effort.  Ambulation/Gait             General Gait Details: transfers only due to instability.   Stairs             Wheelchair Mobility    Modified Rankin (Stroke Patients Only)       Balance                                            Cognition Arousal/Alertness: Awake/alert Behavior During Therapy: WFL for tasks assessed/performed Overall Cognitive Status: Within Functional Limits for tasks assessed                                 General Comments: AxO x 2 sleepy but cooperative.  Good historian.  She asked about her morning meds and she did not eat breakfast.  It was 10 am.  Exercises      General Comments        Pertinent Vitals/Pain Pain Assessment: Faces Faces Pain Scale: Hurts little more Pain Location: L foot Pain Descriptors / Indicators: Aching;Discomfort Pain Intervention(s): Monitored during session;Repositioned    Home Living                      Prior Function            PT Goals (current goals can now be found in the care plan section) Progress towards PT goals: Progressing toward goals    Frequency    Min 3X/week      PT Plan Current plan remains appropriate    Co-evaluation              AM-PAC PT "6 Clicks" Mobility   Outcome Measure  Help needed turning from your back to your side while in a flat bed without using bedrails?: A Lot Help needed moving from lying on your back to sitting on the side of a flat bed without using  bedrails?: A Lot Help needed moving to and from a bed to a chair (including a wheelchair)?: A Lot Help needed standing up from a chair using your arms (e.g., wheelchair or bedside chair)?: A Lot Help needed to walk in hospital room?: Total Help needed climbing 3-5 steps with a railing? : Total 6 Click Score: 10    End of Session Equipment Utilized During Treatment: Gait belt Activity Tolerance: Patient limited by fatigue Patient left: in chair;with call bell/phone within reach;with chair alarm set Nurse Communication: Mobility status (pt voided in [facility]) PT Visit Diagnosis: Other abnormalities of gait and mobility (R26.89)     Time: [id number] PT Time Calculation (min) (ACUTE ONLY): 18 min  Charges:  $Therapeutic Activity: 8-22 mins                     {Ronte Parker  PTA Acute  Rehabilitation Services Pager      [phone] Office      [phone]

## 2020-12-14 NOTE — Care Management Important Message (Signed)
Important Message  Patient Details IM Letter given to the Patient. Name: Nancy Brooks MRN: [id number] Date of Birth: July 16, 1950   Medicare Important Message Given:  Yes     Kerin Salen 12/14/2020, 1:34 PM

## 2020-12-14 NOTE — Progress Notes (Signed)
PROGRESS NOTE    RARITY MCGOWEN  [id number] DOB: 12-Sep-1950 DOA: 12/10/2020 PCP: Hoyt Koch, MD     Brief Narrative:  Patient 70 year old female history of depression, anxiety, GERD, hyperlipidemia admitted directly from podiatry office for repair of Lisfranc fracture on left foot.  Await SNF placement.   Assessment & Plan:   Principal Problem:   Foot fracture, left Active Problems:   Hyperlipidemia   Schizophrenia, unspecified type (HCC)   GERD   Dislocation of tarsometatarsal joint of left foot   Constipation  left lower extremity Lisfranc fracture dislocation -Status post closed reduction left metatarsal-reduction and pinning external fixation left foot per orthopedics, Dr. Sherryle Lis. -PT- SNF placement  -TOC consult placed.  -Check COVID-19 PCR screening in anticipation of placement tomorrow. -Per podiatry.    GERD -Famotidine.   Chronic constipation -Linzess.   Hyperlipidemia -Statin.  Borderline blood pressure -Blood pressure improved.  -Saline lock IV fluids.   Schizophrenia/depression -Stable on home regimen of Librium, Clozaril.    DVT prophylaxis: Postop per orthopedics>>> Lovenox Code Status: Full Disposition:   Status is: Inpatient  Remains inpatient appropriate because:Unsafe d/c plan  Dispo: The patient is from: Home              Anticipated d/c is to: SNF              Patient currently is medically stable to d/c.   Difficult to place patient No       Consultants:  Podiatry: Dr. Sherryle Lis  Procedures:  Closed reduction left metatarsal-reduction and pinning external fixation foot left per Dr. Sherryle Lis podiatry 12/11/2020    Subjective: Denies complaints   Objective: Vitals:   12/13/20 1408 12/13/20 2046 12/14/20 0500 12/14/20 0553  BP: 94/71 125/78  119/66  Pulse: 94 (!) 102  96  Resp: '15 18  20  '$ Temp: 97.8 F (36.6 C) 99.1 F (37.3 C)  97.6 F (36.4 C)  TempSrc: Oral Oral  Oral  SpO2: 96% 96%  96%  Weight:    57 kg     Intake/Output Summary (Last 24 hours) at 12/14/2020 [id number] Last data filed at 12/14/2020 [id number] Gross per 24 hour  Intake 697 ml  Output 2200 ml  Net -1503 ml   Filed Weights   12/12/20 0500 12/13/20 0500 12/14/20 0500  Weight: 59.3 kg 59.4 kg 57 kg    Examination:   General: Appearance:    Chronically ill appearing female in no acute distress     Lungs:     respirations unlabored  Heart:    Normal heart rate.    MS:   All extremities are intact. Left dressing in place   Neurologic:   Awake, alert     Data Reviewed: I have personally reviewed following labs and imaging studies  CBC: Recent Labs  Lab 12/10/20 1918 12/11/20 0527 12/12/20 0628 12/13/20 0943  WBC 7.0 5.6 8.0 6.2  NEUTROABS 5.7  --   --  4.2  HGB 11.4* 11.2* 10.0* 11.2*  HCT 36.2 35.0* 31.2* 35.1*  MCV 89.4 90.4 90.2 91.4  PLT 245 226 204 [id number]    Basic Metabolic Panel: Recent Labs  Lab 12/10/20 1918 12/11/20 0527 12/12/20 0628 12/13/20 0943  NA 142 144 142 145  K 4.1 3.9 3.5 3.9  CL 106 110 108 108  CO2 '30 29 27 28  '$ GLUCOSE 161* 92 91 110*  BUN 30* 28* 15 9  CREATININE 0.96 0.70 0.52 0.65  CALCIUM 9.0 8.9 8.3* 9.1  MG 2.4  --  2.3  --     GFR: Estimated Creatinine Clearance: 51.5 mL/min (by C-G formula based on SCr of 0.65 mg/dL).  Liver Function Tests: No results for input(s): AST, ALT, ALKPHOS, BILITOT, PROT, ALBUMIN in the last 168 hours.  CBG: No results for input(s): GLUCAP in the last 168 hours.   Recent Results (from the past 240 hour(s))  Resp Panel by RT-PCR (Flu A&B, Covid) Nasopharyngeal Swab     Status: None   Collection Time: 12/10/20  5:30 PM   Specimen: Nasopharyngeal Swab; Nasopharyngeal(NP) swabs in vial transport medium  Result Value Ref Range Status   SARS Coronavirus 2 by RT PCR NEGATIVE NEGATIVE Final    Comment: (NOTE) SARS-CoV-2 target nucleic acids are NOT DETECTED.  The SARS-CoV-2 RNA is generally detectable in upper respiratory specimens during  the acute phase of infection. The lowest concentration of SARS-CoV-2 viral copies this assay can detect is 138 copies/mL. A negative result does not preclude SARS-Cov-2 infection and should not be used as the sole basis for treatment or other patient management decisions. A negative result may occur with  improper specimen collection/handling, submission of specimen other than nasopharyngeal swab, presence of viral mutation(s) within the areas targeted by this assay, and inadequate number of viral copies(<138 copies/mL). A negative result must be combined with clinical observations, patient history, and epidemiological information. The expected result is Negative.  Fact Sheet for Patients:  EntrepreneurPulse.com.au  Fact Sheet for Healthcare Providers:  IncredibleEmployment.be  This test is no t yet approved or cleared by the [place] FDA and  has been authorized for detection and/or diagnosis of SARS-CoV-2 by FDA under an Emergency Use Authorization (EUA). This EUA will remain  in effect (meaning this test can be used) for the duration of the COVID-19 declaration under Section 564(b)(1) of the Act, 21 U.S.C.section 360bbb-3(b)(1), unless the authorization is terminated  or revoked sooner.       Influenza A by PCR NEGATIVE NEGATIVE Final   Influenza B by PCR NEGATIVE NEGATIVE Final    Comment: (NOTE) The Xpert Xpress SARS-CoV-2/FLU/RSV plus assay is intended as an aid in the diagnosis of influenza from Nasopharyngeal swab specimens and should not be used as a sole basis for treatment. Nasal washings and aspirates are unacceptable for Xpert Xpress SARS-CoV-2/FLU/RSV testing.  Fact Sheet for Patients: EntrepreneurPulse.com.au  Fact Sheet for Healthcare Providers: IncredibleEmployment.be  This test is not yet approved or cleared by the [place] FDA and has been authorized for detection and/or  diagnosis of SARS-CoV-2 by FDA under an Emergency Use Authorization (EUA). This EUA will remain in effect (meaning this test can be used) for the duration of the COVID-19 declaration under Section 564(b)(1) of the Act, 21 U.S.C. section 360bbb-3(b)(1), unless the authorization is terminated or revoked.  Performed at [facility], [place] [address] [place], [place] [address]   SARS CORONAVIRUS 2 (TAT 6-24 HRS) Nasopharyngeal Nasopharyngeal Swab     Status: None   Collection Time: 12/13/20  3:34 PM   Specimen: Nasopharyngeal Swab  Result Value Ref Range Status   SARS Coronavirus 2 NEGATIVE NEGATIVE Final    Comment: (NOTE) SARS-CoV-2 target nucleic acids are NOT DETECTED.  The SARS-CoV-2 RNA is generally detectable in upper and lower respiratory specimens during the acute phase of infection. Negative results do not preclude SARS-CoV-2 infection, do not rule out co-infections with other pathogens, and should not be used as the sole basis for treatment or other patient management decisions. Negative results must be combined with  clinical observations, patient history, and epidemiological information. The expected result is Negative.  Fact Sheet for Patients: SugarRoll.be  Fact Sheet for Healthcare Providers: https://www.woods-mathews.com/  This test is not yet approved or cleared by the [place] FDA and  has been authorized for detection and/or diagnosis of SARS-CoV-2 by FDA under an Emergency Use Authorization (EUA). This EUA will remain  in effect (meaning this test can be used) for the duration of the COVID-19 declaration under Se ction 564(b)(1) of the Act, 21 U.S.C. section 360bbb-3(b)(1), unless the authorization is terminated or revoked sooner.  Performed at [facility], [place] [address] [place], [place] [address]           Radiology Studies: No results found.      Scheduled Meds:   calcium carbonate  1,000 mg Oral Daily   chlordiazePOXIDE  10 mg Oral BID   cloZAPine  100 mg Oral Daily   And   cloZAPine  400 mg Oral QHS   enoxaparin (LOVENOX) injection  40 mg Subcutaneous Q24H   famotidine  10 mg Oral Daily   linaclotide  145 mcg Oral QAC breakfast   simvastatin  40 mg Oral q1800   Continuous Infusions:     LOS: 4 days    Time spent: 35 minutes    Geradine Girt, DO [facility]   To contact the attending provider between 7A-7P or the covering provider during after hours 7P-7A, please log into the web site [email or web address] and access using universal [place] password for that web site. If you do not have the password, please call the hospital operator.  12/14/2020, 8:07 AM

## 2020-12-15 DIAGNOSIS — S92902A Unspecified fracture of left foot, initial encounter for closed fracture: Secondary | ICD-10-CM | POA: Diagnosis not present

## 2020-12-15 MED ORDER — HYDROCODONE-ACETAMINOPHEN 5-325 MG PO TABS: 1.0000 | ORAL_TABLET | ORAL | 0 refills | Status: DC | PRN

## 2020-12-15 NOTE — Progress Notes (Signed)
  Subjective:  Patient ID: Nancy Brooks, female    DOB: Feb 05, 1951,  MRN: [id number]  Seen at bedside POD 3 from Lisfranc reduction and pinning.  She says her pain is improving.  Negative for chest pain and shortness of breath Chest pain: no Shortness of breath: no Fever: no Night sweats: no Constitutional signs: no Review of all other systems is negative Objective:   Vitals:   12/14/20 2121 12/15/20 0600  BP: (!) 106/55 (!) 105/52  Pulse: (!) 106 (!) 110  Resp: 16 17  Temp: 99.1 F (37.3 C) 98.4 F (36.9 C)  SpO2: 95% 94%   General AA&O x3. Normal mood and affect.  Vascular Dorsalis pedis and posterior tibial pulses 2/4 bilat. Brisk capillary refill to all digits. Pedal hair present.  Significant edema still but improving  Neurologic Epicritic sensation grossly intact.  Dermatologic Pin sites clean with no signs of infection.  Medial wound is healing well with fibrotic base  Orthopedic: Ankle range of motion and motion to toes intact    Assessment & Plan:  Patient was evaluated and treated and all questions answered.  POD #3 Lisfranc dislocation open reduction and pinning -NWB in posterior splint -Okay to DC from my standpoint -Offload heels while in house and at nursing facility -Strict fall precautions while in house and at nursing facility -Follow-up with me in 1 week for postop visit, scheduled for 9/16 at 4:15PM  [name], DPM  Accessible via secure chat for questions or concerns.

## 2020-12-15 NOTE — TOC Progression Note (Signed)
Transition of Care [facility]) - Progression Note    Patient Details  Name: Nancy Brooks MRN: [id number] Date of Birth: [birth year]  Transition of Care [facility]) CM/SW Contact  Kashius Dominic, Marjie Skiff, RN Phone Number: 12/15/2020, 9:59 AM  Clinical Narrative:    Pasrr received: [id number]. Auth received from [facility] [id number]. Auth good from 8/9-8/11. [facility] does not have a SNF bed for pt until tomorrow. MD made aware.   Expected Discharge Plan: [place] Barriers to Discharge: Continued Medical Work up, Ship broker  Expected Discharge Plan and Services Expected Discharge Plan: [place] Choice: [place] arrangements for the past 2 months: Single Family Home Expected Discharge Date: 12/15/20                   Readmission Risk Interventions No flowsheet data found.

## 2020-12-15 NOTE — Telephone Encounter (Signed)
Please call [name], the patient's sister to see exactly what is needed.  Her last CBC was December 10, 2020 and it has been entered into [facility] so it is current.  It sounds like what is needed is just to make sure that she has a current prescription for clozapine in the facility in which she will do rehab.  If rehab lasts longer than a month then we can request that they do another CBC so that she can continue her clozapine.

## 2020-12-15 NOTE — Telephone Encounter (Signed)
Please review

## 2020-12-15 NOTE — Progress Notes (Signed)
Occupational Therapy Treatment Patient Details Name: Nancy Brooks MRN: [id number] DOB: January 29, 1951 Today's Date: 12/15/2020    History of present illness Patient is a 70 year old female adml from podiatrist office with left foot Lisfranc fracture d/t fall on 7/24. pt is s/p closed reduction left metatarsal-reduction and pinning external fixation left foot per podiatry,  [id number], GERD, depression, schizophrenia   OT comments  Patient was noted to make progress in therapy. Patient required mod A for supine to sit on edge of bed with education on proper hand and foot placement. Patient required TD for clothing management and hygiene tasks at RW level with increased attempts to maintain NWB status. Patient required multimodal cues to maintain NWB status during all tasks. Patient's discharge plan remains appropriate at this time. OT will continue to follow acutely.    Follow Up Recommendations  SNF    Equipment Recommendations  None recommended by OT    Recommendations for Other Services      Precautions / Restrictions Precautions Precautions: Fall Required Braces or Orthoses: Splint/Cast Splint/Cast: L lower leg splint Restrictions Weight Bearing Restrictions: Yes LLE Weight Bearing: Non weight bearing       Mobility Bed Mobility Overal bed mobility: Needs Assistance Bed Mobility: Supine to Sit     Supine to sit: Mod assist          Transfers   Equipment used: Rolling walker (2 wheeled) Transfers: Sit to/from [facility] Sit to Stand: Max assist Stand pivot transfers: Max assist       General transfer comment: see toileting transfer section for more info    Balance Overall balance assessment: Needs assistance Sitting-balance support: Bilateral upper extremity supported Sitting balance-Leahy Scale: Fair       Standing balance-Leahy Scale: Poor Standing balance comment: reliant on RW/UEs and external assist                            ADL either performed or assessed with clinical judgement   ADL     Eating/Feeding Details (indicate cue type and reason): patient was able to participate in drinking juice sitting on edge of bed with set up for task.                     Toilet Transfer: RW;BSC;Stand-pivot;Moderate assistance Toilet Transfer Details (indicate cue type and reason): patient was mod A to complete transfer with RW with max multimodal cues to maintain NWB status with RW turning to the R side. patient was mod A to piviot to recliner to R side as well. Toileting- Clothing Manipulation and Hygiene: Total assistance;Sit to/from stand Toileting - Clothing Manipulation Details (indicate cue type and reason): with two attempts to complete hygiene and then clothing management with max multimodal cues to maintain NWB status with transfer and during standing with RW.       General ADL Comments: patient was able to maintain WB restrictions when standing with RW at edge of bed with multimodal cues.     Vision       Perception     Praxis      Cognition Arousal/Alertness: Awake/alert Behavior During Therapy: WFL for tasks assessed/performed Overall Cognitive Status: Within Functional Limits for tasks assessed  Exercises     Shoulder Instructions       General Comments      Pertinent Vitals/ Pain       Pain Assessment: 0-10 Pain Score: 6  Pain Location: L foot Pain Descriptors / Indicators: Aching;Discomfort Pain Intervention(s): Patient requesting pain meds-RN notified;Limited activity within patient's tolerance;Monitored during session;RN gave pain meds during session  Home Living                                          Prior Functioning/Environment              Frequency  Min 2X/week        Progress Toward Goals  OT Goals(current goals can now be found in the care plan section)  Progress towards OT  goals: Progressing toward goals  Acute Rehab OT Goals Patient Stated Goal: to get better OT Goal Formulation: With patient Time For Goal Achievement: 12/26/20 Potential to Achieve Goals: Good  Plan Discharge plan remains appropriate    Co-evaluation                 AM-PAC OT "6 Clicks" Daily Activity     Outcome Measure   Help from another person eating meals?: A Little Help from another person taking care of personal grooming?: A Little Help from another person toileting, which includes using toliet, bedpan, or urinal?: A Lot Help from another person bathing (including washing, rinsing, drying)?: A Lot Help from another person to put on and taking off regular upper body clothing?: A Little Help from another person to put on and taking off regular lower body clothing?: A Lot 6 Click Score: 15    End of Session Equipment Utilized During Treatment: Gait belt;Rolling walker  OT Visit Diagnosis: Unsteadiness on feet (R26.81);History of falling (Z91.81);Pain Pain - Right/Left: Left Pain - part of body: Ankle and joints of foot   Activity Tolerance Patient tolerated treatment well   Patient Left in chair;with chair alarm set;with call bell/phone within reach   Nurse Communication Patient requests pain meds        Time: [id number] OT Time Calculation (min): 31 min  Charges: OT General Charges $OT Visit: 1 Visit OT Treatments $Self Care/Home Management : 23-37 mins  Jackelyn Poling OTR/L, MS Acute Rehabilitation Department Office# [phone] Pager# [phone]    [place] 12/15/2020, 9:11 AM

## 2020-12-15 NOTE — Discharge Summary (Signed)
Physician Discharge Summary  Nancy Brooks [id number] DOB: 02-Dec-1950 DOA: 12/10/2020  PCP: Hoyt Koch, MD  Admit date: 12/10/2020 Discharge date: 12/15/2020  Admitted From: home Discharge disposition: SNF   Recommendations for Outpatient Follow-Up:   Podiatry: NWB in posterior splint -Okay to DC from my standpoint -Offload heels while in house and at nursing facility -Strict fall precautions while in house and at nursing facility -Follow-up with me in 1 week for postop visit, scheduled for 9/16 at 4:15PM   Discharge Diagnosis:   Principal Problem:   Foot fracture, left Active Problems:   Hyperlipidemia   Schizophrenia, unspecified type (HCC)   GERD   Dislocation of tarsometatarsal joint of left foot   Constipation    Discharge Condition: Improved.  Diet recommendation: Regular.    Code status: Full.   History of Present Illness:   Nancy Brooks is a 70 y.o. female with medical history significant of anxiety, depression, GERD, hyperlipidemia who is being directly admitted from podiatry office.  Reportedly, patient fell on 11/29/2020 and twisted her left foot which was swollen with a blistered area and abrasions across the top and medial side.  She went to see her PCP.  X-rays were done and she was diagnosed with Lisfranc's injury.  She was advised to stay nonweightbearing.  She was then seen by podiatry/Dr. Sherryle Lis 3 days ago and was diagnosed with dislocation of the tarsometatarsal joint of the left foot" fracture of the base of the metatarsal bone with routine healing.  She was recommended surgery however due to swelling, she was prescribed antibiotics.  Podiatrist follow-up with her and due to patient's inability to nonweightbearing, we decided to directly admit the patient and he plans to do surgical procedure tomorrow to fix the fracture.  Patient currently complains of left foot pain.  No other complaint.  She is fully alert and  oriented.   Hospital Course by Problem:   left lower extremity Lisfranc fracture dislocation -Status post closed reduction left metatarsal-reduction and pinning external fixation left foot per orthopedics, Dr. Sherryle Lis. -PT- SNF placement  -see above for podiatry recommendations   GERD -Famotidine.   Chronic constipation -Linzess.   Hyperlipidemia -Statin.  Borderline blood pressure -Blood pressure improved.   Schizophrenia/depression -Stable on home regimen of Librium, Clozaril.    Medical Consultants:    podiatry  Discharge Exam:   Vitals:   12/14/20 2121 12/15/20 0600  BP: (!) 106/55 (!) 105/52  Pulse: (!) 106 (!) 110  Resp: 16 17  Temp: 99.1 F (37.3 C) 98.4 F (36.9 C)  SpO2: 95% 94%   Vitals:   12/14/20 1410 12/14/20 2121 12/15/20 0500 12/15/20 0600  BP: (!) 82/51 (!) 106/55  (!) 105/52  Pulse: 100 (!) 106  (!) 110  Resp: '16 16  17  '$ Temp: 98.4 F (36.9 C) 99.1 F (37.3 C)  98.4 F (36.9 C)  TempSrc: Oral Oral  Oral  SpO2: 100% 95%  94%  Weight:   53.7 kg     General exam: Appears calm and comfortable.  The results of significant diagnostics from this hospitalization (including imaging, microbiology, ancillary and laboratory) are listed below for reference.     Procedures and Diagnostic Studies:   No results found.   Labs:   Basic Metabolic Panel: Recent Labs  Lab 12/10/20 1918 12/11/20 0527 12/12/20 0628 12/13/20 0943  NA 142 144 142 145  K 4.1 3.9 3.5 3.9  CL 106 110 108 108  CO2 30 29  27 28  GLUCOSE 161* 92 91 110*  BUN 30* 28* 15 9  CREATININE 0.96 0.70 0.52 0.65  CALCIUM 9.0 8.9 8.3* 9.1  MG 2.4  --  2.3  --    GFR Estimated Creatinine Clearance: 51.5 mL/min (by C-G formula based on SCr of 0.65 mg/dL). Liver Function Tests: No results for input(s): AST, ALT, ALKPHOS, BILITOT, PROT, ALBUMIN in the last 168 hours. No results for input(s): LIPASE, AMYLASE in the last 168 hours. No results for input(s): AMMONIA in the  last 168 hours. Coagulation profile No results for input(s): INR, PROTIME in the last 168 hours.  CBC: Recent Labs  Lab 12/10/20 1918 12/11/20 0527 12/12/20 0628 12/13/20 0943  WBC 7.0 5.6 8.0 6.2  NEUTROABS 5.7  --   --  4.2  HGB 11.4* 11.2* 10.0* 11.2*  HCT 36.2 35.0* 31.2* 35.1*  MCV 89.4 90.4 90.2 91.4  PLT 245 226 204 214   Cardiac Enzymes: No results for input(s): CKTOTAL, CKMB, CKMBINDEX, TROPONINI in the last 168 hours. BNP: Invalid input(s): POCBNP CBG: No results for input(s): GLUCAP in the last 168 hours. D-Dimer No results for input(s): DDIMER in the last 72 hours. Hgb A1c No results for input(s): HGBA1C in the last 72 hours. Lipid Profile No results for input(s): CHOL, HDL, LDLCALC, TRIG, CHOLHDL, LDLDIRECT in the last 72 hours. Thyroid function studies No results for input(s): TSH, T4TOTAL, T3FREE, THYROIDAB in the last 72 hours.  Invalid input(s): FREET3 Anemia work up No results for input(s): VITAMINB12, FOLATE, FERRITIN, TIBC, IRON, RETICCTPCT in the last 72 hours. Microbiology Recent Results (from the past 240 hour(s))  Resp Panel by RT-PCR (Flu A&B, Covid) Nasopharyngeal Swab     Status: None   Collection Time: 12/10/20  5:30 PM   Specimen: Nasopharyngeal Swab; Nasopharyngeal(NP) swabs in vial transport medium  Result Value Ref Range Status   SARS Coronavirus 2 by RT PCR NEGATIVE NEGATIVE Final    Comment: (NOTE) SARS-CoV-2 target nucleic acids are NOT DETECTED.  The SARS-CoV-2 RNA is generally detectable in upper respiratory specimens during the acute phase of infection. The lowest concentration of SARS-CoV-2 viral copies this assay can detect is 138 copies/mL. A negative result does not preclude SARS-Cov-2 infection and should not be used as the sole basis for treatment or other patient management decisions. A negative result may occur with  improper specimen collection/handling, submission of specimen other than nasopharyngeal swab, presence  of viral mutation(s) within the areas targeted by this assay, and inadequate number of viral copies(<138 copies/mL). A negative result must be combined with clinical observations, patient history, and epidemiological information. The expected result is Negative.  Fact Sheet for Patients:  EntrepreneurPulse.com.au  Fact Sheet for Healthcare Providers:  IncredibleEmployment.be  This test is no t yet approved or cleared by the [place] FDA and  has been authorized for detection and/or diagnosis of SARS-CoV-2 by FDA under an Emergency Use Authorization (EUA). This EUA will remain  in effect (meaning this test can be used) for the duration of the COVID-19 declaration under Section 564(b)(1) of the Act, 21 U.S.C.section 360bbb-3(b)(1), unless the authorization is terminated  or revoked sooner.       Influenza A by PCR NEGATIVE NEGATIVE Final   Influenza B by PCR NEGATIVE NEGATIVE Final    Comment: (NOTE) The Xpert Xpress SARS-CoV-2/FLU/RSV plus assay is intended as an aid in the diagnosis of influenza from Nasopharyngeal swab specimens and should not be used as a sole basis for treatment. Nasal washings and  aspirates are unacceptable for Xpert Xpress SARS-CoV-2/FLU/RSV testing.  Fact Sheet for Patients: EntrepreneurPulse.com.au  Fact Sheet for Healthcare Providers: IncredibleEmployment.be  This test is not yet approved or cleared by the [place] FDA and has been authorized for detection and/or diagnosis of SARS-CoV-2 by FDA under an Emergency Use Authorization (EUA). This EUA will remain in effect (meaning this test can be used) for the duration of the COVID-19 declaration under Section 564(b)(1) of the Act, 21 U.S.C. section 360bbb-3(b)(1), unless the authorization is terminated or revoked.  Performed at [facility], [place] [address], [place], [place] [address]   SARS  CORONAVIRUS 2 (TAT 6-24 HRS) Nasopharyngeal Nasopharyngeal Swab     Status: None   Collection Time: 12/13/20  3:34 PM   Specimen: Nasopharyngeal Swab  Result Value Ref Range Status   SARS Coronavirus 2 NEGATIVE NEGATIVE Final    Comment: (NOTE) SARS-CoV-2 target nucleic acids are NOT DETECTED.  The SARS-CoV-2 RNA is generally detectable in upper and lower respiratory specimens during the acute phase of infection. Negative results do not preclude SARS-CoV-2 infection, do not rule out co-infections with other pathogens, and should not be used as the sole basis for treatment or other patient management decisions. Negative results must be combined with clinical observations, patient history, and epidemiological information. The expected result is Negative.  Fact Sheet for Patients: SugarRoll.be  Fact Sheet for Healthcare Providers: https://www.woods-mathews.com/  This test is not yet approved or cleared by the [place] FDA and  has been authorized for detection and/or diagnosis of SARS-CoV-2 by FDA under an Emergency Use Authorization (EUA). This EUA will remain  in effect (meaning this test can be used) for the duration of the COVID-19 declaration under Se ction 564(b)(1) of the Act, 21 U.S.C. section 360bbb-3(b)(1), unless the authorization is terminated or revoked sooner.  Performed at [facility], [place] [address] [place], [place] [address]      Discharge Instructions:   Discharge Instructions     Diet - low sodium heart healthy   Complete by: As directed    Discharge wound care:   Complete by: As directed    See d/c summary   Increase activity slowly   Complete by: As directed       Allergies as of 12/15/2020   No Known Allergies      Medication List     STOP taking these medications    cephALEXin 500 MG capsule Commonly known as: Keflex   ibandronate 150 MG tablet Commonly known as: BONIVA   Moderna  COVID-19 Vaccine 100 MCG/0.5ML injection Generic drug: COVID-19 mRNA vaccine (Moderna)   Sutab [phone] MG Tabs Generic drug: Sodium Sulfate-Mag Sulfate-KCl       TAKE these medications    calcium elemental as carbonate 400 MG chewable tablet Commonly known as: BARIATRIC TUMS ULTRA Chew 1,000 mg by mouth daily.   chlordiazePOXIDE 10 MG capsule Commonly known as: LIBRIUM Take 1 capsule (10 mg total) by mouth in the morning and at bedtime.   cloZAPine 100 MG tablet Commonly known as: CLOZARIL TAKE 1 TABLET BY MOUTH EVERY MORNING AND 4 TABLETS EVERY EVENING What changed:  how much to take how to take this when to take this additional instructions   famotidine 10 MG tablet Commonly known as: PEPCID Take 10 mg by mouth daily.   HYDROcodone-acetaminophen 5-325 MG tablet Commonly known as: NORCO/VICODIN Take 1-2 tablets by mouth every 4 (four) hours as needed for moderate pain.   linaclotide 72 MCG  capsule Commonly known as: Linzess TAKE 1 CAPSULE(72 MCG) BY MOUTH DAILY What changed:  how much to take how to take this when to take this additional instructions   polyethylene glycol 17 g packet Commonly known as: MIRALAX / GLYCOLAX Take 17 g by mouth daily as needed.   simvastatin 40 MG tablet Commonly known as: ZOCOR TAKE 1 TABLET(40 MG) BY MOUTH DAILY AT 6 PM What changed: See the new instructions.   vitamin B-12 1000 MCG tablet Commonly known as: CYANOCOBALAMIN Take 1 tablet (1,000 mcg total) by mouth daily.   Vitamin D3 25 MCG (1000 UT) Caps Take 1,000 Units by mouth daily.               Durable Medical Equipment  (From admission, onward)           Start     Ordered   12/13/20 0906  For home use only DME 4 wheeled rolling walker with seat  Once       Question:  Patient needs a walker to treat with the following condition  Answer:  Foot fracture, left   12/13/20 0906              Discharge Care Instructions  (From admission,  onward)           Start     Ordered   12/15/20 0000  Discharge wound care:       Comments: See d/c summary   12/15/20 0805            Follow-up Information     Hoyt Koch, MD Follow up.   Specialty: Internal Medicine Contact information: [place] [address] [phone]                  Time coordinating discharge: 35 min  Signed:  Geradine Girt DO  [facility] 12/15/2020, 8:28 AM

## 2020-12-15 NOTE — Telephone Encounter (Signed)
In addition, to the note on 8/4 sister was also trying to get info on how to do the clozapine. She has had surgery and is at a facility and she cannot bear weight on foot. How should she go about getting her labs done and get her clozapine refilled? At [facility]  Rm [phone] and will be D/C tomorrow, going to [place], [place]. [name], sister, had called about this last week. Can we contact her doctor, Lanae Crumbly, MD or 6th floor nurses unit in hospital. [name] [phone]

## 2020-12-16 ENCOUNTER — Other Ambulatory Visit: Payer: Self-pay

## 2020-12-16 DIAGNOSIS — R6883 Chills (without fever): Secondary | ICD-10-CM

## 2020-12-16 LAB — URINALYSIS, ROUTINE W REFLEX MICROSCOPIC
Bacteria, UA: NONE SEEN
Bilirubin Urine: NEGATIVE
Glucose, UA: NEGATIVE mg/dL
Hgb urine dipstick: NEGATIVE
Ketones, ur: 5 mg/dL — AB
Leukocytes,Ua: NEGATIVE
Nitrite: NEGATIVE
Protein, ur: 30 mg/dL — AB
Specific Gravity, Urine: 1.036 — ABNORMAL HIGH (ref 1.005–1.030)
pH: 5 (ref 5.0–8.0)

## 2020-12-16 LAB — CBC
HCT: 36.5 % (ref 36.0–46.0)
Hemoglobin: 11.6 g/dL — ABNORMAL LOW (ref 12.0–15.0)
MCH: 28.6 pg (ref 26.0–34.0)
MCHC: 31.8 g/dL (ref 30.0–36.0)
MCV: 89.9 fL (ref 80.0–100.0)
Platelets: 229 10*3/uL (ref 150–400)
RBC: 4.06 MIL/uL (ref 3.87–5.11)
RDW: 14.7 % (ref 11.5–15.5)
WBC: 10.6 10*3/uL — ABNORMAL HIGH (ref 4.0–10.5)
nRBC: 0 % (ref 0.0–0.2)

## 2020-12-16 LAB — GLUCOSE, CAPILLARY: Glucose-Capillary: 117 mg/dL — ABNORMAL HIGH (ref 70–99)

## 2020-12-16 MED ORDER — SODIUM CHLORIDE 0.9 % IV SOLN
2.0000 g | Freq: Once | INTRAVENOUS | Status: AC
  Administered 2020-12-16: 2 g via INTRAVENOUS
  Filled 2020-12-16: qty 2

## 2020-12-16 NOTE — Progress Notes (Signed)
   12/16/20 0011  Assess: MEWS Score  Temp (!) 100.4 F (38 C)  BP (!) 118/52  Pulse Rate (!) 119  Resp 18  SpO2 91 %  O2 Device Room Air  Assess: MEWS Score  MEWS Temp 0  MEWS Systolic 0  MEWS Pulse 2  MEWS RR 0  MEWS LOC 0  MEWS Score 2  MEWS Score Color Yellow  Assess: if the MEWS score is Yellow or Red  Were vital signs taken at a resting state? Yes  Focused Assessment No change from prior assessment  Does the patient meet 2 or more of the SIRS criteria? No  MEWS guidelines implemented *See Row Information* No, previously yellow, continue vital signs every 4 hours  Treat  MEWS Interventions Administered prn meds/treatments  Take Vital Signs  Increase Vital Sign Frequency  Yellow: Q 2hr X 2 then Q 4hr X 2, if remains yellow, continue Q 4hrs  Escalate  MEWS: Escalate Yellow: discuss with charge nurse/RN and consider discussing with provider and RRT  Notify: Charge Nurse/RN  Name of Charge Nurse/RN Notified Rossie, RN  Date Charge Nurse/RN Notified 12/16/20  Time Charge Nurse/RN Notified 0011  Notify: Provider  Provider Name/Title Clarene Essex  Date Provider Notified 12/16/20  Time Provider Notified [phone]  Notification Type Page  Notification Reason Other (Comment) (pulse 119, temp 100.4)  Provider response Other (Comment) (give Tylenol)  Date of Provider Response 12/16/20  Time of Provider Response 0038  Document  Patient Outcome Stabilized after interventions (T: 98.6 P: 110)  Assess: SIRS CRITERIA  SIRS Temperature  0  SIRS Pulse 1  SIRS Respirations  0  SIRS WBC 0  SIRS Score Sum  1

## 2020-12-16 NOTE — Progress Notes (Addendum)
9AM Patient with elevated temperature last PM.  Now with chills as well.  Thinks she may have had painful urination but "can't remember".  WBC slighly elevated.  Check U/A.  Plan to start abx if abnormal and d/c to SNF for further management.   JV  1:47 PM- continue to await U/A. Will not start abx until then.  5pm: urinalysis still not done-- will dose with abx as I do not want to delay care further  JV

## 2020-12-16 NOTE — Progress Notes (Signed)
Physical Therapy Treatment Patient Details Name: Nancy Brooks MRN: [id number] DOB: [birth year] Today's Date: 12/16/2020    History of Present Illness Patient is a 70 year old female adml from podiatrist office with left foot Lisfranc fracture d/t fall on 7/24. pt is s/p closed reduction left metatarsal-reduction and pinning external fixation left foot per podiatry,  [id number], GERD, depression, schizophrenia    PT Comments    General Comments: AxO x 2 very anxious today and unable to follow repeat commands.  Kept griping bed rail and present with severe LEFT lean and forward flex head and trunk. General bed mobility comments: pt required increased assist with inabilitry to follow directions.  Curled up into fetal position, restless and very anxious.  Required + 2 asisst to transition to EOB.  Then required + 1 assist to prevent forward LOB as pt stayed in fetal position as well as severe LEFT lean.  Pt was verbal but not able to stay focused. General transfer comment: pt required + 2 Total Assist this session to transfer from elevated bed to [facility] 1/4 pivot.  Pt remained in forward flex posture, restless and would not let go of anything she could grip.  Very different from last session on Monday. Assisted to [facility] then to recliner Total Assist SPS no AD.  + to scoot to back od recliner and elevate B LE as well as postion upright and correct LEFT lean.  Follow Up Recommendations  SNF     Equipment Recommendations       Recommendations for Other Services       Precautions / Restrictions Precautions Precautions: Fall Precaution Comments: anxiety Required Braces or Orthoses: Splint/Cast Splint/Cast: L lower leg splint Restrictions Weight Bearing Restrictions: Yes LLE Weight Bearing: Non weight bearing    Mobility  Bed Mobility Overal bed mobility: Needs Assistance Bed Mobility: Supine to Sit     Supine to sit: +2 for safety/equipment;+2 for physical assistance;Max assist      General bed mobility comments: pt required increased assist with inabilitry to follow directions.  Curled up into fetal position, restless and very anxious.  Required + 2 asisst to transition to EOB.  Then required + 1 assist to prevent forward LOB as pt stayed in fetal position as well as severe LEFT lean.  Pt was verbal but not able to stay focused.    Transfers Overall transfer level: Needs assistance   Transfers: Stand Pivot Transfers   Stand pivot transfers: Total assist;+2 physical assistance;+2 safety/equipment       General transfer comment: pt required + 2 Total Assist this session to transfer from elevated bed to [facility] 1/4 pivot.  Pt remained in forward flex posture, restless and would not let go of anything she could grip.  Very different from last session on Monday. Assisted to [facility] then to recliner Total Assist SPS no AD.  + to scoot to back od recliner and elevate B LE as well as postion upright and correct LEFT lean.  Ambulation/Gait             General Gait Details: transfers only due to instability.   Stairs             Wheelchair Mobility    Modified Rankin (Stroke Patients Only)       Balance  Cognition Arousal/Alertness: Awake/alert Behavior During Therapy: WFL for tasks assessed/performed Overall Cognitive Status: Within Functional Limits for tasks assessed                                 General Comments: AxO x 2 very anxious today and unable to follow repeat commands.  Kept griping bed rail and present with severe LEFT lean and forward flex head and trunk.      Exercises      General Comments        Pertinent Vitals/Pain Pain Assessment: Faces Faces Pain Scale: Hurts little more Pain Location: L foot Pain Descriptors / Indicators: Aching;Discomfort;Constant Pain Intervention(s): Monitored during session;Repositioned    Home Living                       Prior Function            PT Goals (current goals can now be found in the care plan section) Progress towards PT goals: Progressing toward goals    Frequency    Min 3X/week      PT Plan Current plan remains appropriate    Co-evaluation              AM-PAC PT "6 Clicks" Mobility   Outcome Measure  Help needed turning from your back to your side while in a flat bed without using bedrails?: Total Help needed moving from lying on your back to sitting on the side of a flat bed without using bedrails?: Total Help needed moving to and from a bed to a chair (including a wheelchair)?: Total Help needed standing up from a chair using your arms (e.g., wheelchair or bedside chair)?: Total Help needed to walk in hospital room?: Total Help needed climbing 3-5 steps with a railing? : Total 6 Click Score: 6    End of Session   Activity Tolerance: Patient limited by fatigue Patient left: in chair;with call bell/phone within reach;with chair alarm set Nurse Communication: Mobility status PT Visit Diagnosis: Other abnormalities of gait and mobility (R26.89)     Time: [id number] PT Time Calculation (min) (ACUTE ONLY): 25 min  Charges:  $Therapeutic Activity: 23-37 mins                    Rica Koyanagi  PTA Acute  Rehabilitation Services Pager      [phone] Office      [phone]

## 2020-12-16 NOTE — Telephone Encounter (Signed)
Correct, probably just anxious about her being at facility and afraid they won't get it done. Will contact her and confirm to get CBC order completed.

## 2020-12-16 NOTE — Progress Notes (Signed)
   12/15/20 2204  Assess: MEWS Score  Temp 99 F (37.2 C)  BP 101/79  Pulse Rate (!) 117  Resp 18  SpO2 96 %  O2 Device Room Air  Assess: MEWS Score  MEWS Temp 0  MEWS Systolic 0  MEWS Pulse 2  MEWS RR 0  MEWS LOC 0  MEWS Score 2  MEWS Score Color Yellow  Assess: if the MEWS score is Yellow or Red  Were vital signs taken at a resting state? Yes  Focused Assessment No change from prior assessment  Does the patient meet 2 or more of the SIRS criteria? No  MEWS guidelines implemented *See Row Information* Yes  Treat  MEWS Interventions Administered prn meds/treatments  Take Vital Signs  Increase Vital Sign Frequency  Yellow: Q 2hr X 2 then Q 4hr X 2, if remains yellow, continue Q 4hrs  Escalate  MEWS: Escalate Yellow: discuss with charge nurse/RN and consider discussing with provider and RRT  Notify: Charge Nurse/RN  Name of Charge Nurse/RN Notified Johniya Durfee, RN  Date Charge Nurse/RN Notified 12/15/20  Time Charge Nurse/RN Notified 2204  Document  Patient Outcome Stabilized after interventions  Assess: SIRS CRITERIA  SIRS Temperature  0  SIRS Pulse 1  SIRS Respirations  0  SIRS WBC 0  SIRS Score Sum  1

## 2020-12-17 ENCOUNTER — Encounter: Payer: Self-pay | Admitting: Internal Medicine

## 2020-12-17 ENCOUNTER — Encounter: Payer: Medicare Other | Admitting: Podiatry

## 2020-12-17 DIAGNOSIS — F209 Schizophrenia, unspecified: Secondary | ICD-10-CM | POA: Diagnosis not present

## 2020-12-17 DIAGNOSIS — S92902A Unspecified fracture of left foot, initial encounter for closed fracture: Secondary | ICD-10-CM | POA: Diagnosis not present

## 2020-12-17 DIAGNOSIS — K5904 Chronic idiopathic constipation: Secondary | ICD-10-CM | POA: Diagnosis not present

## 2020-12-17 LAB — CBC WITH DIFFERENTIAL/PLATELET
Abs Immature Granulocytes: 0.06 10*3/uL (ref 0.00–0.07)
Basophils Absolute: 0 10*3/uL (ref 0.0–0.1)
Basophils Relative: 0 %
Eosinophils Absolute: 0 10*3/uL (ref 0.0–0.5)
Eosinophils Relative: 0 %
HCT: 37.8 % (ref 36.0–46.0)
Hemoglobin: 12 g/dL (ref 12.0–15.0)
Immature Granulocytes: 1 %
Lymphocytes Relative: 6 %
Lymphs Abs: 0.5 10*3/uL — ABNORMAL LOW (ref 0.7–4.0)
MCH: 28.7 pg (ref 26.0–34.0)
MCHC: 31.7 g/dL (ref 30.0–36.0)
MCV: 90.4 fL (ref 80.0–100.0)
Monocytes Absolute: 0.6 10*3/uL (ref 0.1–1.0)
Monocytes Relative: 8 %
Neutro Abs: 7.2 10*3/uL (ref 1.7–7.7)
Neutrophils Relative %: 85 %
Platelets: 191 10*3/uL (ref 150–400)
RBC: 4.18 MIL/uL (ref 3.87–5.11)
RDW: 14.6 % (ref 11.5–15.5)
WBC: 8.4 10*3/uL (ref 4.0–10.5)
nRBC: 0 % (ref 0.0–0.2)

## 2020-12-17 LAB — GLUCOSE, CAPILLARY: Glucose-Capillary: 93 mg/dL (ref 70–99)

## 2020-12-17 LAB — RESP PANEL BY RT-PCR (FLU A&B, COVID) ARPGX2
Influenza A by PCR: NEGATIVE
Influenza B by PCR: NEGATIVE
SARS Coronavirus 2 by RT PCR: NEGATIVE

## 2020-12-17 NOTE — TOC Transition Note (Signed)
Transition of Care [facility]) - CM/SW Discharge Note   Patient Details  Name: Nancy Brooks MRN: [id number] Date of Birth: 28-Nov-1950  [facility] of [facility]) CM/SW Contact:  Lynnell Catalan, RN Phone Number: 12/17/2020, 1:43 PM   Clinical Narrative:     Pt to dc to [facility] today via [facility]. RN to call report to [phone].   Final next level of care: [facility] Barriers to Discharge: Continued Medical Work up, Ship broker   Patient Goals and CMS Choice Patient states their goals for this hospitalization and ongoing recovery are:: To go to SNF for short term rehab, then return back home. CMS Medicare.gov Compare Post Acute Care list provided to:: Patient Choice offered to / list presented to : Patient   Discharge Plan and Services     Post Acute Care Choice: [facility]              Readmission Risk Interventions No flowsheet data found.

## 2020-12-17 NOTE — Care Management Important Message (Signed)
Important Message  Patient Details IM Letter given to the Patient. Name: Nancy Brooks MRN: [id number] Date of Birth: 1950/10/12   Medicare Important Message Given:  Yes     Kerin Salen 12/17/2020, 9:54 AM

## 2020-12-17 NOTE — Progress Notes (Signed)
Occupational Therapy Progress Note  Patient initially groggy upon arrival, more alert with mobility. Patient needing mod A x2 for leg and trunk management upright due to initial posterior lean. Set up patient at edge of bed for grooming/hygiene however patient with poor insight squeezing significant tooth paste out and not onto brush needing assist. Patient needing 3 times with poor gross motor control to get tooth brush into her mouth. Patient needing mod A for thoroughness to wash her hands, able to wash face. Cue patient in technique for sit to stand from edge of bed with walker, needing mod A x2 to power up. Patient tolerate ~10 seconds static stand, unable to try hopping towards head of bed therefore returned to sitting. Patient seems distractible and needing cues to redirect during tasks. CNA reports patient was up all night/restless.     12/17/20 1100  OT Visit Information  Last OT Received On 12/17/20  Assistance Needed +2  History of Present Illness Patient is a 70 year old female adml from podiatrist office with left foot Lisfranc fracture d/t fall on 7/24. pt is s/p closed reduction left metatarsal-reduction and pinning external fixation left foot per podiatry,  [id number], GERD, depression, schizophrenia  Precautions  Precautions Fall  Precaution Comments anxiety  Required Braces or Orthoses Splint/Cast  Splint/Cast L lower leg splint  Pain Assessment  Pain Assessment Faces  Faces Pain Scale 2  Pain Location L foot  Pain Descriptors / Indicators Aching  Pain Intervention(s) Monitored during session  Cognition  Arousal/Alertness Awake/alert  Behavior During Therapy Flat affect  Overall Cognitive Status Impaired/Different from baseline  Area of Impairment Following commands;Safety/judgement;Problem solving;Orientation  Orientation Level Disoriented to;Time (year)  Following Commands Follows one step commands with increased time  Safety/Judgement Decreased awareness of safety   Problem Solving Requires verbal cues;Requires tactile cues  General Comments patient oriented to place, month asked multiple times for year and keep getting nonsensical answer. Patient with poor insight squeezing excessive tooth paste without noticing its not getting onto brush. also needing min cues to redirect at edge of bed not to lay down. CNA reports patient was up all night restless.  Upper Extremity Assessment  Upper Extremity Assessment LUE deficits/detail;RUE deficits/detail  RUE Coordination decreased fine motor;decreased gross motor  LUE Coordination decreased gross motor;decreased fine motor  ADL  Overall ADL's  Needs assistance/impaired  Grooming Oral care;Wash/dry face;Wash/dry hands;Sitting;Moderate assistance  Grooming Details (indicate cue type and reason) patient squeezing excessive tooth paste from container and not hitting tooth brush needing cues to redirect. needed 3 attempts to bring tooth brush to mouth as she kept hitting her cheek. needing assist to thoroughly wash hands, able to wash face  Toilet Transfer Moderate assistance;+2 for physical assistance;+2 for safety/equipment;RW  Toilet Transfer Details (indicate cue type and reason) sit to stand only. needing cues for hand placement and to keep L LE out in front to maintain NWB. Able to maintain static standing with +2 assist for ~10 seconds and unable to try hopping towards edge of bed therefore returned to sitting  Functional mobility during ADLs Moderate assistance;+2 for physical assistance;+2 for safety/equipment;Rolling walker;Cueing for safety  Bed Mobility  Overal bed mobility Needs Assistance  Bed Mobility Supine to Sit;Sit to Supine  Supine to sit Mod assist;HOB elevated;+2 for physical assistance;+2 for safety/equipment  Sit to supine Mod assist  General bed mobility comments assist to bring legs to edge of bed and mod A at trunk due to initial posterior lean/patient appearing anxious with sitting upright.  Able to lay trunk down at end of session needing mod A to lift legs onto bed  Balance  Overall balance assessment Needs assistance  Sitting-balance support Feet supported  Sitting balance-Leahy Scale Fair  Standing balance support Bilateral upper extremity supported  Standing balance-Leahy Scale Poor  Standing balance comment reliant on RW/UEs and external assist  Restrictions  Weight Bearing Restrictions Yes  LLE Weight Bearing NWB  Transfers  Overall transfer level Needs assistance  Equipment used Rolling walker (2 wheeled)  Transfers Sit to/from Stand  Sit to Stand Mod assist;+2 physical assistance;+2 safety/equipment  General transfer comment please see toilet transfer in ADL section  OT - End of Session  Equipment Utilized During Treatment Gait belt;Rolling walker  Activity Tolerance Patient tolerated treatment well  Patient left in bed;with call bell/phone within reach;with bed alarm set  Nurse Communication Mobility status  OT Assessment/Plan  OT Plan Discharge plan remains appropriate  OT Visit Diagnosis Unsteadiness on feet (R26.81);History of falling (Z91.81);Pain  Pain - Right/Left Left  Pain - part of body Ankle and joints of foot  OT Frequency (ACUTE ONLY) Min 2X/week  Follow Up Recommendations SNF  OT Equipment None recommended by OT  AM-PAC OT "6 Clicks" Daily Activity Outcome Measure (Version 2)  Help from another person eating meals? 3  Help from another person taking care of personal grooming? 2  Help from another person toileting, which includes using toliet, bedpan, or urinal? 2  Help from another person bathing (including washing, rinsing, drying)? 2  Help from another person to put on and taking off regular upper body clothing? 3  Help from another person to put on and taking off regular lower body clothing? 2  6 Click Score 14  Progressive Mobility  What is the highest level of mobility based on the progressive mobility assessment? Level 3 (Stands with  assist) - Balance while standing  and cannot march in place  Mobility Sit up in bed/chair position for meals  OT Goal Progression  Progress towards OT goals Progressing toward goals  Acute Rehab OT Goals  Patient Stated Goal to get better  OT Goal Formulation With patient  Time For Goal Achievement 12/26/20  Potential to Achieve Goals Good  ADL Goals  Pt Will Perform Lower Body Dressing with adaptive equipment;sitting/lateral leans;with modified independence  Pt Will Transfer to Toilet stand pivot transfer;with supervision  Pt Will Perform Toileting - Clothing Manipulation and hygiene with supervision;sitting/lateral leans  OT Time Calculation  OT Start Time (ACUTE ONLY) 0959  OT Stop Time (ACUTE ONLY) 1015  OT Time Calculation (min) 16 min  OT General Charges  $OT Visit 1 Visit  OT Treatments  $Self Care/Home Management  8-22 mins   Delbert Phenix OT OT pager: [phone]

## 2020-12-17 NOTE — ED Provider Notes (Signed)
Report given to Leah,RN at [place] facility.

## 2020-12-17 NOTE — Telephone Encounter (Signed)
Spoke with [name] and Doristine stayed at hospital again last night so she's not been transferred yet. [name] will let me know. Reassured [name] that once she gets transferred, I can contact the facility and ask their protocol for labs. We can fax an order over. She was appreciative of information.  She also reports Lolitha is confused and not saying things right, she concerned. She mentioned pt is on pain medication and perhaps that has made her confused. Then she proceeds to tell me Belkis has a UTI from having a catheter in. Informed [name] that's where the confusion is coming from and once her antibiotic is completed she should be clear. She reports pt has never had one before.    She also ask if Dr. Clovis Pu has Mychart, informed her he could see notes. She wants him to read the note from physical therapy from [facility]. Didn't go into detail but asked he do that. Janaa has apt scheduled on Tuesday afternoon, she does want her to come to apt to see Dr. Clovis Pu but not sure that is going to happen. I left the apt there and she will call back with more information.

## 2020-12-17 NOTE — Discharge Summary (Signed)
Physician Discharge Summary  Nancy Brooks [id number] DOB: April 18, 1951 DOA: 12/10/2020  PCP: Hoyt Koch, MD  Admit date: 12/10/2020 Discharge date: 12/17/2020  Admitted From: home Discharge disposition: SNF   Recommendations for Outpatient Follow-Up:   Podiatry: NWB in posterior splint -Okay to DC from my standpoint -Offload heels while in house and at nursing facility -Strict fall precautions while in house and at nursing facility -Follow-up with me in 1 week for postop visit, scheduled for 9/16 at 4:15PM  -encourage incentive spirometry -encourage PO intake   Discharge Diagnosis:   Principal Problem:   Foot fracture, left Active Problems:   Hyperlipidemia   Schizophrenia, unspecified type (HCC)   GERD   Dislocation of tarsometatarsal joint of left foot   Constipation   Chills    Discharge Condition: Improved.  Diet recommendation: Regular.    Code status: Full.   History of Present Illness:   Nancy Brooks is a 70 y.o. female with medical history significant of anxiety, depression, GERD, hyperlipidemia who is being directly admitted from podiatry office.  Reportedly, patient fell on 11/29/2020 and twisted her left foot which was swollen with a blistered area and abrasions across the top and medial side.  She went to see her PCP.  X-rays were done and she was diagnosed with Lisfranc's injury.  She was advised to stay nonweightbearing.  She was then seen by podiatry/Dr. Sherryle Lis 3 days ago and was diagnosed with dislocation of the tarsometatarsal joint of the left foot" fracture of the base of the metatarsal bone with routine healing.  She was recommended surgery however due to swelling, she was prescribed antibiotics.  Podiatrist follow-up with her and due to patient's inability to nonweightbearing, we decided to directly admit the patient and he plans to do surgical procedure tomorrow to fix the fracture.  Patient currently complains of left foot  pain.  No other complaint.  She is fully alert and oriented.   Hospital Course by Problem:   left lower extremity Lisfranc fracture dislocation -Status post closed reduction left metatarsal-reduction and pinning external fixation left foot per orthopedics, Dr. Sherryle Lis. -PT- SNF placement  -see above for podiatry recommendations   GERD -Famotidine.   Chronic constipation -Linzess.   Hyperlipidemia -Statin.  Borderline blood pressure -Blood pressure improved.   Schizophrenia/depression -Stable on home regimen of Librium, Clozaril.  U/A negative for UTI  Medical Consultants:    podiatry  Discharge Exam:   Vitals:   12/17/20 0622 12/17/20 0958  BP: 130/70 (!) 119/54  Pulse: (!) 115 (!) 115  Resp: 14 16  Temp: 98.8 F (37.1 C) 98.7 F (37.1 C)  SpO2: 93% 93%   Vitals:   12/17/20 0142 12/17/20 0622 12/17/20 0632 12/17/20 0958  BP: (!) 123/43 130/70  (!) 119/54  Pulse: (!) 119 (!) 115  (!) 115  Resp: '17 14  16  '$ Temp: 99.6 F (37.6 C) 98.8 F (37.1 C)  98.7 F (37.1 C)  TempSrc: Oral Oral  Oral  SpO2: 94% 93%  93%  Weight:   54.5 kg     General exam: Appears calm and comfortable.  The results of significant diagnostics from this hospitalization (including imaging, microbiology, ancillary and laboratory) are listed below for reference.     Procedures and Diagnostic Studies:   No results found.   Labs:   Basic Metabolic Panel: Recent Labs  Lab 12/10/20 1918 12/11/20 0527 12/12/20 0628 12/13/20 0943  NA 142 144 142 145  K 4.1 3.9  3.5 3.9  CL 106 110 108 108  CO2 '30 29 27 28  '$ GLUCOSE 161* 92 91 110*  BUN 30* 28* 15 9  CREATININE 0.96 0.70 0.52 0.65  CALCIUM 9.0 8.9 8.3* 9.1  MG 2.4  --  2.3  --    GFR Estimated Creatinine Clearance: 51.5 mL/min (by C-G formula based on SCr of 0.65 mg/dL). Liver Function Tests: No results for input(s): AST, ALT, ALKPHOS, BILITOT, PROT, ALBUMIN in the last 168 hours. No results for input(s): LIPASE,  AMYLASE in the last 168 hours. No results for input(s): AMMONIA in the last 168 hours. Coagulation profile No results for input(s): INR, PROTIME in the last 168 hours.  CBC: Recent Labs  Lab 12/10/20 1918 12/11/20 0527 12/12/20 0628 12/13/20 0943 12/16/20 0832 12/17/20 0559  WBC 7.0 5.6 8.0 6.2 10.6* 8.4  NEUTROABS 5.7  --   --  4.2  --  7.2  HGB 11.4* 11.2* 10.0* 11.2* 11.6* 12.0  HCT 36.2 35.0* 31.2* 35.1* 36.5 37.8  MCV 89.4 90.4 90.2 91.4 89.9 90.4  PLT 245 226 204 214 229 191   Cardiac Enzymes: No results for input(s): CKTOTAL, CKMB, CKMBINDEX, TROPONINI in the last 168 hours. BNP: Invalid input(s): POCBNP CBG: Recent Labs  Lab 12/16/20 1023 12/17/20 0746  GLUCAP 117* 93   D-Dimer No results for input(s): DDIMER in the last 72 hours. Hgb A1c No results for input(s): HGBA1C in the last 72 hours. Lipid Profile No results for input(s): CHOL, HDL, LDLCALC, TRIG, CHOLHDL, LDLDIRECT in the last 72 hours. Thyroid function studies No results for input(s): TSH, T4TOTAL, T3FREE, THYROIDAB in the last 72 hours.  Invalid input(s): FREET3 Anemia work up No results for input(s): VITAMINB12, FOLATE, FERRITIN, TIBC, IRON, RETICCTPCT in the last 72 hours. Microbiology Recent Results (from the past 240 hour(s))  Resp Panel by RT-PCR (Flu A&B, Covid) Nasopharyngeal Swab     Status: None   Collection Time: 12/10/20  5:30 PM   Specimen: Nasopharyngeal Swab; Nasopharyngeal(NP) swabs in vial transport medium  Result Value Ref Range Status   SARS Coronavirus 2 by RT PCR NEGATIVE NEGATIVE Final    Comment: (NOTE) SARS-CoV-2 target nucleic acids are NOT DETECTED.  The SARS-CoV-2 RNA is generally detectable in upper respiratory specimens during the acute phase of infection. The lowest concentration of SARS-CoV-2 viral copies this assay can detect is 138 copies/mL. A negative result does not preclude SARS-Cov-2 infection and should not be used as the sole basis for treatment  or other patient management decisions. A negative result may occur with  improper specimen collection/handling, submission of specimen other than nasopharyngeal swab, presence of viral mutation(s) within the areas targeted by this assay, and inadequate number of viral copies(<138 copies/mL). A negative result must be combined with clinical observations, patient history, and epidemiological information. The expected result is Negative.  Fact Sheet for Patients:  EntrepreneurPulse.com.au  Fact Sheet for Healthcare Providers:  IncredibleEmployment.be  This test is no t yet approved or cleared by the [place] FDA and  has been authorized for detection and/or diagnosis of SARS-CoV-2 by FDA under an Emergency Use Authorization (EUA). This EUA will remain  in effect (meaning this test can be used) for the duration of the COVID-19 declaration under Section 564(b)(1) of the Act, 21 U.S.C.section 360bbb-3(b)(1), unless the authorization is terminated  or revoked sooner.       Influenza A by PCR NEGATIVE NEGATIVE Final   Influenza B by PCR NEGATIVE NEGATIVE Final    Comment: (NOTE)  The Xpert Xpress SARS-CoV-2/FLU/RSV plus assay is intended as an aid in the diagnosis of influenza from Nasopharyngeal swab specimens and should not be used as a sole basis for treatment. Nasal washings and aspirates are unacceptable for Xpert Xpress SARS-CoV-2/FLU/RSV testing.  Fact Sheet for Patients: EntrepreneurPulse.com.au  Fact Sheet for Healthcare Providers: IncredibleEmployment.be  This test is not yet approved or cleared by the [place] FDA and has been authorized for detection and/or diagnosis of SARS-CoV-2 by FDA under an Emergency Use Authorization (EUA). This EUA will remain in effect (meaning this test can be used) for the duration of the COVID-19 declaration under Section 564(b)(1) of the Act, 21 U.S.C. section  360bbb-3(b)(1), unless the authorization is terminated or revoked.  Performed at [facility], [place] [address], [place], [place] [address]   SARS CORONAVIRUS 2 (TAT 6-24 HRS) Nasopharyngeal Nasopharyngeal Swab     Status: None   Collection Time: 12/13/20  3:34 PM   Specimen: Nasopharyngeal Swab  Result Value Ref Range Status   SARS Coronavirus 2 NEGATIVE NEGATIVE Final    Comment: (NOTE) SARS-CoV-2 target nucleic acids are NOT DETECTED.  The SARS-CoV-2 RNA is generally detectable in upper and lower respiratory specimens during the acute phase of infection. Negative results do not preclude SARS-CoV-2 infection, do not rule out co-infections with other pathogens, and should not be used as the sole basis for treatment or other patient management decisions. Negative results must be combined with clinical observations, patient history, and epidemiological information. The expected result is Negative.  Fact Sheet for Patients: SugarRoll.be  Fact Sheet for Healthcare Providers: https://www.woods-mathews.com/  This test is not yet approved or cleared by the [place] FDA and  has been authorized for detection and/or diagnosis of SARS-CoV-2 by FDA under an Emergency Use Authorization (EUA). This EUA will remain  in effect (meaning this test can be used) for the duration of the COVID-19 declaration under Se ction 564(b)(1) of the Act, 21 U.S.C. section 360bbb-3(b)(1), unless the authorization is terminated or revoked sooner.  Performed at [facility], [place] [address], [place], [place] [address]   Resp Panel by RT-PCR (Flu A&B, Covid) Nasopharyngeal Swab     Status: None   Collection Time: 12/17/20 10:51 AM   Specimen: Nasopharyngeal Swab; Nasopharyngeal(NP) swabs in vial transport medium  Result Value Ref Range Status   SARS Coronavirus 2 by RT PCR NEGATIVE NEGATIVE Final    Comment: (NOTE) SARS-CoV-2 target  nucleic acids are NOT DETECTED.  The SARS-CoV-2 RNA is generally detectable in upper respiratory specimens during the acute phase of infection. The lowest concentration of SARS-CoV-2 viral copies this assay can detect is 138 copies/mL. A negative result does not preclude SARS-Cov-2 infection and should not be used as the sole basis for treatment or other patient management decisions. A negative result may occur with  improper specimen collection/handling, submission of specimen other than nasopharyngeal swab, presence of viral mutation(s) within the areas targeted by this assay, and inadequate number of viral copies(<138 copies/mL). A negative result must be combined with clinical observations, patient history, and epidemiological information. The expected result is Negative.  Fact Sheet for Patients:  EntrepreneurPulse.com.au  Fact Sheet for Healthcare Providers:  IncredibleEmployment.be  This test is no t yet approved or cleared by the [place] FDA and  has been authorized for detection and/or diagnosis of SARS-CoV-2 by FDA under an Emergency Use Authorization (EUA). This EUA will remain  in effect (meaning this test can be used) for the duration  of the COVID-19 declaration under Section 564(b)(1) of the Act, 21 U.S.C.section 360bbb-3(b)(1), unless the authorization is terminated  or revoked sooner.       Influenza A by PCR NEGATIVE NEGATIVE Final   Influenza B by PCR NEGATIVE NEGATIVE Final    Comment: (NOTE) The Xpert Xpress SARS-CoV-2/FLU/RSV plus assay is intended as an aid in the diagnosis of influenza from Nasopharyngeal swab specimens and should not be used as a sole basis for treatment. Nasal washings and aspirates are unacceptable for Xpert Xpress SARS-CoV-2/FLU/RSV testing.  Fact Sheet for Patients: EntrepreneurPulse.com.au  Fact Sheet for Healthcare  Providers: IncredibleEmployment.be  This test is not yet approved or cleared by the [place] FDA and has been authorized for detection and/or diagnosis of SARS-CoV-2 by FDA under an Emergency Use Authorization (EUA). This EUA will remain in effect (meaning this test can be used) for the duration of the COVID-19 declaration under Section 564(b)(1) of the Act, 21 U.S.C. section 360bbb-3(b)(1), unless the authorization is terminated or revoked.  Performed at [facility], [place] [address], [place], [place] [address]      Discharge Instructions:   Discharge Instructions     Diet - low sodium heart healthy   Complete by: As directed    Discharge wound care:   Complete by: As directed    See d/c summary   Increase activity slowly   Complete by: As directed       Allergies as of 12/17/2020   No Known Allergies      Medication List     STOP taking these medications    cephALEXin 500 MG capsule Commonly known as: Keflex   ibandronate 150 MG tablet Commonly known as: BONIVA   Moderna COVID-19 Vaccine 100 MCG/0.5ML injection Generic drug: COVID-19 mRNA vaccine (Moderna)   Sutab [phone] MG Tabs Generic drug: Sodium Sulfate-Mag Sulfate-KCl       TAKE these medications    calcium elemental as carbonate 400 MG chewable tablet Commonly known as: BARIATRIC TUMS ULTRA Chew 1,000 mg by mouth daily.   chlordiazePOXIDE 10 MG capsule Commonly known as: LIBRIUM Take 1 capsule (10 mg total) by mouth in the morning and at bedtime.   cloZAPine 100 MG tablet Commonly known as: CLOZARIL TAKE 1 TABLET BY MOUTH EVERY MORNING AND 4 TABLETS EVERY EVENING What changed:  how much to take how to take this when to take this additional instructions   famotidine 10 MG tablet Commonly known as: PEPCID Take 10 mg by mouth daily.   HYDROcodone-acetaminophen 5-325 MG tablet Commonly known as: NORCO/VICODIN Take 1-2 tablets by mouth  every 4 (four) hours as needed for moderate pain.   linaclotide 72 MCG capsule Commonly known as: Linzess TAKE 1 CAPSULE(72 MCG) BY MOUTH DAILY What changed:  how much to take how to take this when to take this additional instructions   polyethylene glycol 17 g packet Commonly known as: MIRALAX / GLYCOLAX Take 17 g by mouth daily as needed.   simvastatin 40 MG tablet Commonly known as: ZOCOR TAKE 1 TABLET(40 MG) BY MOUTH DAILY AT 6 PM What changed: See the new instructions.   vitamin B-12 1000 MCG tablet Commonly known as: CYANOCOBALAMIN Take 1 tablet (1,000 mcg total) by mouth daily.   Vitamin D3 25 MCG (1000 UT) Caps Take 1,000 Units by mouth daily.               Durable Medical Equipment  (From admission, onward)  Start     Ordered   12/13/20 0906  For home use only DME 4 wheeled rolling walker with seat  Once       Question:  Patient needs a walker to treat with the following condition  Answer:  Foot fracture, left   12/13/20 0906              Discharge Care Instructions  (From admission, onward)           Start     Ordered   12/15/20 0000  Discharge wound care:       Comments: See d/c summary   12/15/20 0805            Follow-up Information     Hoyt Koch, MD Follow up.   Specialty: Internal Medicine Contact information: [place] [address] [phone]                  Time coordinating discharge: 35 min  Signed:  Geradine Girt DO  [facility] 12/17/2020, 12:23 PM

## 2020-12-18 NOTE — Telephone Encounter (Signed)
[name] called with an update that pt was moved on 12/17/20 to [facility], [place] [phone]  I will contact them to discuss pt getting labs for her Clozapine if still in the nursing facility at time due. Her last labs were 12/10/2020

## 2020-12-18 NOTE — Telephone Encounter (Signed)
Thank you to [employer], LPN for the detailed information on the patient's status.  I agree with the information given to the patient's Sister [name] who has POA.  Reviewed physical therapy note as the sister had requested. Clinically is not surprising the patient is confused right after a major surgery while also having a UTI.  With treatment she will improve and get back to her baseline.  I believe physical physical therapy will help her if she is able to cooperate with that and it is too early to assess that at this time. No med changes are indicated for psychiatry

## 2020-12-18 NOTE — Telephone Encounter (Signed)
[place] to give verbal orders for Miralax. Unable to get in contact with anyone due to the phone constantly ringing. I will try and contact later on today.

## 2020-12-21 ENCOUNTER — Ambulatory Visit (INDEPENDENT_AMBULATORY_CARE_PROVIDER_SITE_OTHER): Payer: Medicare Other

## 2020-12-21 ENCOUNTER — Ambulatory Visit (INDEPENDENT_AMBULATORY_CARE_PROVIDER_SITE_OTHER): Payer: Medicare Other | Admitting: Podiatry

## 2020-12-21 ENCOUNTER — Other Ambulatory Visit: Payer: Self-pay

## 2020-12-21 DIAGNOSIS — S92302D Fracture of unspecified metatarsal bone(s), left foot, subsequent encounter for fracture with routine healing: Secondary | ICD-10-CM

## 2020-12-21 DIAGNOSIS — T8460XA Infection and inflammatory reaction due to internal fixation device of unspecified site, initial encounter: Secondary | ICD-10-CM

## 2020-12-21 DIAGNOSIS — L02612 Cutaneous abscess of left foot: Secondary | ICD-10-CM

## 2020-12-21 MED ORDER — DOXYCYCLINE HYCLATE 100 MG PO TABS: 100.0000 mg | ORAL_TABLET | Freq: Two times a day (BID) | ORAL | 0 refills | Status: DC

## 2020-12-21 NOTE — Patient Instructions (Addendum)
Facility Instructions:  Patient should not put ANY weight on the left foot ever until I give the all clear. She requires assist for any and all mobility including from bed to chair and bathroom Keep leg elevated at all times while seated and/or in bed Keep ice behind the knee for 15 minutes every hour and over the foot She should take her antibiotics twice daily as directed Please call me at [phone] Dressing on foot should be changed every other day with betadine to pin sites where they meet the skin and gauze dressings

## 2020-12-22 ENCOUNTER — Encounter: Payer: Self-pay | Admitting: Psychiatry

## 2020-12-22 ENCOUNTER — Ambulatory Visit (INDEPENDENT_AMBULATORY_CARE_PROVIDER_SITE_OTHER): Payer: Medicare Other | Admitting: Psychiatry

## 2020-12-22 ENCOUNTER — Encounter: Payer: Self-pay | Admitting: Podiatry

## 2020-12-22 DIAGNOSIS — F200 Paranoid schizophrenia: Secondary | ICD-10-CM | POA: Diagnosis not present

## 2020-12-22 DIAGNOSIS — K5903 Drug induced constipation: Secondary | ICD-10-CM

## 2020-12-22 DIAGNOSIS — Z79899 Other long term (current) drug therapy: Secondary | ICD-10-CM | POA: Diagnosis not present

## 2020-12-22 DIAGNOSIS — F411 Generalized anxiety disorder: Secondary | ICD-10-CM | POA: Diagnosis not present

## 2020-12-22 NOTE — Addendum Note (Signed)
Addended by: Reatha Armour on: 12/22/2020 03:46 PM   Modules accepted: Level of Service
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
## 2020-12-22 NOTE — Progress Notes (Signed)
  Subjective:  Patient ID: Nancy Brooks, female    DOB: [birth year],  MRN: [id number]  Chief Complaint  Patient presents with   Routine Post Op     (xrays)POV # 1 DOS 12/11/20 ORIF lisfranc fracture    DOS: 12/11/2020 Procedure: Open reduction of Lisfranc fracture with reduction of dislocation and percutaneous pinning  70 y.o. female returns for post-op check.  She is here with her sister.  Unfortunately the facility she is staying and has not been the best care.  Her sister states that they have not been helping her to the bathroom and they told her that she could put weight on the foot.  She also states that they have been allowing her to go to the bathroom in her adult diapers instead of assisting her to the bathroom.  She says her pain has improved but it feels very swollen  Review of Systems: Negative except as noted in the HPI. Denies N/V/F/Ch.   Objective:  There were no vitals filed for this visit. There is no height or weight on file to calculate BMI. Constitutional Well developed. Well nourished.  Vascular Foot warm and well perfused. Capillary refill normal to all digits.   Neurologic Normal speech. Oriented to person, place, and time. Epicritic sensation to light touch grossly present bilaterally.  Dermatologic Splint has signs of weightbearing it is still intact.  She has significant edema and erythema around her pin sites with seropurulent drainage.  Medial wound is healing well and has significantly reduced in size since her last evaluation in the hospital  Orthopedic: Tenderness to palpation noted about the surgical site.   Multiple view plain film radiographs: No appreciable complication of hardware since her immediate postop films Assessment:   1. Pin tract infection, initial encounter ([place])   2. Closed fracture of base of metatarsal bone with routine healing, left    Plan:  Patient was evaluated and treated and all questions answered.  S/p foot surgery  left -Unfortunate seems to have developed a pin tract infection and has some drainage around the pin sites.  I cleaned the pins and took a culture of the drainage.  -Rx for doxycycline for 1 week will change and adjust pending culture results - I gave explicit instructions for her wound care and TO be changed every other day with Betadine to the pin sites.  - I placed her into a CAM boot today to allow for easier dressing changes wrote explicit instructions she absolutely not to put any weight at all on this and I reviewed this with the patient and her sister as well which they understand -Her sister is working on a possible plan for her to come home with assistance we will help to set up home care with home therapy and home nursing -Like to reevaluate her in 1 week if not improving may need to take 2 OR for pin exchange possible external fixator application  Return in about 8 days (around 12/29/2020) for check pin sites , post op (no x-rays).

## 2020-12-24 ENCOUNTER — Encounter: Payer: Medicare Other | Admitting: Podiatry

## 2020-12-24 LAB — WOUND CULTURE
MICRO NUMBER:: 12243324
SPECIMEN QUALITY:: ADEQUATE

## 2020-12-29 ENCOUNTER — Ambulatory Visit (INDEPENDENT_AMBULATORY_CARE_PROVIDER_SITE_OTHER): Payer: Medicare Other | Admitting: Podiatry

## 2020-12-29 ENCOUNTER — Encounter: Payer: Self-pay | Admitting: Podiatry

## 2020-12-29 ENCOUNTER — Other Ambulatory Visit: Payer: Self-pay

## 2020-12-29 DIAGNOSIS — T8460XD Infection and inflammatory reaction due to internal fixation device of unspecified site, subsequent encounter: Secondary | ICD-10-CM

## 2020-12-29 DIAGNOSIS — S93325D Dislocation of tarsometatarsal joint of left foot, subsequent encounter: Secondary | ICD-10-CM

## 2020-12-29 DIAGNOSIS — S92302D Fracture of unspecified metatarsal bone(s), left foot, subsequent encounter for fracture with routine healing: Secondary | ICD-10-CM

## 2020-12-29 NOTE — Patient Instructions (Signed)
Change dressing every other day with betadine to pin sites. Place folded gauze around pin caps so they do not press into skin, then wrap in gauze and cast padding (such as Webril) and a gently ACE wrap.  Continue doxycycline until finished  Elevate the leg. Ice is not always necessary now unless having pain   Continue to be non weightbearing on the foot completely

## 2020-12-30 ENCOUNTER — Encounter: Payer: Self-pay | Admitting: Podiatry

## 2020-12-31 ENCOUNTER — Encounter (HOSPITAL_COMMUNITY): Payer: Self-pay | Admitting: Podiatry

## 2020-12-31 NOTE — Progress Notes (Signed)
  Subjective:  Patient ID: Nancy Brooks, female    DOB: 05-22-1950,  MRN: [id number]  Chief Complaint  Patient presents with   Routine Post Op    POV #  2  DOS 12/11/20 ORIF lisfranc fracture, pin tract infection    DOS: 12/11/2020 Procedure: Open reduction of Lisfranc fracture with reduction of dislocation and percutaneous pinning  70 y.o. female returns for post-op check.  She is here with her sister.  Doing much better.  They moved her to another facility at [facility] and is is much better she is receiving better care now.  The redness and swelling is nearly completely gone.  She is finishing the doxycycline.  Review of Systems: Negative except as noted in the HPI. Denies N/V/F/Ch.   Objective:  There were no vitals filed for this visit. There is no height or weight on file to calculate BMI. Constitutional Well developed. Well nourished.  Vascular Foot warm and well perfused. Capillary refill normal to all digits.   Neurologic Normal speech. Oriented to person, place, and time. Epicritic sensation to light touch grossly present bilaterally.  Dermatologic Overall doing well the dressings clean dry and intact and shows that has been changed recently.  The erythema and edema has nearly completely resolved.  There is no active drainage from the pin sites.  Orthopedic: Tenderness to palpation noted about the surgical site.   Multiple view plain film radiographs: No appreciable complication of hardware since her immediate postop films  Culture grew MSSA sensitive to doxycycline Assessment:   1. Closed fracture of base of metatarsal bone with routine healing, left   2. Pin tract infection, subsequent encounter   3. Dislocation of tarsometatarsal joint of left foot, subsequent encounter    Plan:  Patient was evaluated and treated and all questions answered.  S/p foot surgery left -Unfortunate seems to have developed a pin tract infection and has some drainage around the pin  sites.  I cleaned the pins and took a culture of the drainage.  -Finish doxycycline -Continue pin site care and every other day dressing changes -Continue strict nonweightbearing in the CAM boot -We will reevaluate her in 1 week. -We discussed the next steps of surgery including arthrodesis.  We will plan for this on September 23.  We will sign consent at next visit.  Her sister will discuss with her Medicare carrier whether this is something that she will be continue to stay at [facility] until then and we can do as an outpatient, or if we will have to readmit to the hospital postop and discharge again to nursing facility  Return in about 1 week (around 01/05/2021) for check pin sites, fracture follow up .

## 2021-01-06 NOTE — Telephone Encounter (Signed)
I wanted to follow up with pt and get her location since her CBC will be due soon for her Clozapine REMS. Contacted [name] and she was very appreciative I followed up when I spoke with her earlier this month. She reports pt has now moved to [facility]. [phone]. Informed [name] I would contact them now to plan on getting her CBC, of course while the pt was in the hospital she has had numerous ones done and they also repeated it when she transferred to their facility on 8/23.  I was able to speak to her nurse and I gave a verbal order to do a CBC for her continued treatment of Clozapine. She was given fax # as well. Pt's last CBC documented in REMS is 8/04 and 8/11. The nurse reports she will have the staff draw it tomorrow on 9/01, then it will be faxed over.   I let [name] know everything was taken care of.

## 2021-01-07 ENCOUNTER — Ambulatory Visit (INDEPENDENT_AMBULATORY_CARE_PROVIDER_SITE_OTHER): Payer: Medicare Other

## 2021-01-07 ENCOUNTER — Ambulatory Visit (INDEPENDENT_AMBULATORY_CARE_PROVIDER_SITE_OTHER): Payer: Medicare Other | Admitting: Podiatry

## 2021-01-07 ENCOUNTER — Other Ambulatory Visit: Payer: Self-pay

## 2021-01-07 ENCOUNTER — Encounter: Payer: Medicare Other | Admitting: Podiatry

## 2021-01-07 DIAGNOSIS — S92302D Fracture of unspecified metatarsal bone(s), left foot, subsequent encounter for fracture with routine healing: Secondary | ICD-10-CM

## 2021-01-07 NOTE — Telephone Encounter (Signed)
Noted! Thank you

## 2021-01-12 NOTE — Progress Notes (Signed)
  Subjective:  Patient ID: Nancy Brooks, female    DOB: August 18, 1950,  MRN: [id number]  Chief Complaint  Patient presents with   Routine Post Op      (xray)check pin sites, fracture follow up POV # 3   DOS 12/11/20 ORIF lisfranc fracture    DOS: 12/11/2020 Procedure: Open reduction of Lisfranc fracture with reduction of dislocation and percutaneous pinning  70 y.o. female returns for post-op check.  She is here with her sister.  Doing much better. They have been pleased with the care she is getting at [facility].   Review of Systems: Negative except as noted in the HPI. Denies N/V/F/Ch.   Objective:  There were no vitals filed for this visit. There is no height or weight on file to calculate BMI. Constitutional Well developed. Well nourished.  Vascular Foot warm and well perfused. Capillary refill normal to all digits.   Neurologic Normal speech. Oriented to person, place, and time. Epicritic sensation to light touch grossly present bilaterally.  Dermatologic Overall doing well the dressings clean dry and intact and shows that has been changed recently.  No signs of infection. The lateral pin has moved slightly  Orthopedic: Tenderness to palpation noted about the surgical site.   Multiple view plain film radiographs: No appreciable complication of hardware since her immediate postop films  Culture grew MSSA sensitive to doxycycline Assessment:   1. Closed fracture of base of metatarsal bone with routine healing, left    Plan:  Patient was evaluated and treated and all questions answered.  S/p foot surgery left -Overall doing well and improved. I would like her to go back on the doxycycline for 10 days prior to her reconstruction.  -I will try speak with the staff at [facility] and we will decide what disposition will be best for her prior to and after her next surgery regarding whether she will stay there through and after surgery which would be my preference..  Return in  about 1 week (around 01/14/2021) for lisfranc fracture (no xr unless problem) sign consent for surgery left foot.

## 2021-01-13 ENCOUNTER — Encounter: Payer: Self-pay | Admitting: Podiatry

## 2021-01-13 ENCOUNTER — Telehealth: Payer: Self-pay | Admitting: Urology

## 2021-01-13 NOTE — Telephone Encounter (Signed)
DOS - 01/29/21  ARTHRODESIS MIDTARSAL OR TARSOMETATARSAL LT --- [id number]   [employer] EFFECTIVE DATE - 05/09/20   PLAN DEDUCTIBLE - $0.00 OUT OF POCKET - $2,400.00 W/  $2,226.45 REMAINING COINSURANCE - 0% COPAY -  $100.00   SPOKE WITH VANESSA WITH [employer] AND SHE STATED FOR CPT CODE 96295 NO PRIOR AUTH IS REQUIRED.  REF # [id number]

## 2021-01-14 ENCOUNTER — Ambulatory Visit (INDEPENDENT_AMBULATORY_CARE_PROVIDER_SITE_OTHER): Payer: Medicare Other | Admitting: Podiatry

## 2021-01-14 ENCOUNTER — Other Ambulatory Visit: Payer: Self-pay

## 2021-01-14 ENCOUNTER — Ambulatory Visit (INDEPENDENT_AMBULATORY_CARE_PROVIDER_SITE_OTHER): Payer: Medicare Other

## 2021-01-14 DIAGNOSIS — R600 Localized edema: Secondary | ICD-10-CM

## 2021-01-14 DIAGNOSIS — S93325D Dislocation of tarsometatarsal joint of left foot, subsequent encounter: Secondary | ICD-10-CM

## 2021-01-14 DIAGNOSIS — S92302D Fracture of unspecified metatarsal bone(s), left foot, subsequent encounter for fracture with routine healing: Secondary | ICD-10-CM

## 2021-01-14 MED ORDER — DOXYCYCLINE HYCLATE 100 MG PO TABS: 100.0000 mg | ORAL_TABLET | Freq: Two times a day (BID) | ORAL | 0 refills | Status: AC

## 2021-01-15 NOTE — Progress Notes (Signed)
  Subjective:  Patient ID: Nancy Brooks, female    DOB: December 05, 1950,  MRN: [id number]  Chief Complaint  Patient presents with   Fracture    for lisfranc fracture (no xr unless problem) sign consent for surgery left foot.    DOS: 12/11/2020 Procedure: Open reduction of Lisfranc fracture with reduction of dislocation and percutaneous pinning  70 y.o. female returns for post-op check.  She is here with her sister.  Doing much better. They have been pleased with the care she is getting at [facility].  Medicare has told him that they will stop covering her on Saturday and they have filed an appeal for this  Review of Systems: Negative except as noted in the HPI. Denies N/V/F/Ch.   Objective:  There were no vitals filed for this visit. There is no height or weight on file to calculate BMI. Constitutional Well developed. Well nourished.  Vascular Foot warm and well perfused. Capillary refill normal to all digits.   Neurologic Normal speech. Oriented to person, place, and time. Epicritic sensation to light touch grossly present bilaterally.  Dermatologic Overall doing well the dressings clean dry and intact and shows that has been changed recently.  No signs of infection. The lateral pin has moved slightly  Orthopedic: Tenderness to palpation noted about the surgical site.   Multiple view plain film radiographs: No appreciable complication of hardware since her immediate postop films  Culture grew MSSA sensitive to doxycycline Assessment:   1. Edema of right lower extremity   2. Closed fracture of base of metatarsal bone with routine healing, left   3. Dislocation of tarsometatarsal joint of left foot, subsequent encounter    Plan:  Patient was evaluated and treated and all questions answered.  S/p foot surgery left -Agree with her sister that her leaving the facility and coming to home would be quite detrimental to her care and progress.  I have written a letter of medical  necessity and faxed to the Medicare claim that her sister provided in support of this that she can stay through the second stage of surgery and for the postoperative course. -Rx for doxycycline was given to her sister as well as her to restart this on Monday and continue until surgery -We discussed the second stage of her surgery with arthrodesis using internal fixation of the first second and third tarsal metatarsal joints.  We discussed the risk benefits and potential complications including but not limited to pain, swelling, infection, scar, numbness which may be temporary or permanent, chronic pain, stiffness, nerve pain or damage, wound healing problems, bone healing problems including delayed or non-union.  Informed consent was signed and reviewed.  Surgery is now scheduled for an outpatient procedure and hopeful return to her facility currently after discharge.  If her appeal is denied we will plan to admit postoperatively and try to have her re-placed into subacute rehab.   No follow-ups on file.

## 2021-01-15 NOTE — H&P (View-Only) (Signed)
  Subjective:  Patient ID: Nancy Brooks, female    DOB: 1951-04-28,  MRN: [id number]  Chief Complaint  Patient presents with   Fracture    for lisfranc fracture (no xr unless problem) sign consent for surgery left foot.    DOS: 12/11/2020 Procedure: Open reduction of Lisfranc fracture with reduction of dislocation and percutaneous pinning  70 y.o. female returns for post-op check.  She is here with Nancy Brooks.  Doing much better. They have been pleased with the care she is getting at [facility].  Medicare has told him that they will stop covering Nancy on Saturday and they have filed an appeal for this  Review of Systems: Negative except as noted in the HPI. Denies N/V/F/Ch.   Objective:  There were no vitals filed for this visit. There is no height or weight on file to calculate BMI. Constitutional Well developed. Well nourished.  Vascular Foot warm and well perfused. Capillary refill normal to all digits.   Neurologic Normal speech. Oriented to person, place, and time. Epicritic sensation to light touch grossly present bilaterally.  Dermatologic Overall doing well the dressings clean dry and intact and shows that has been changed recently.  No signs of infection. The lateral pin has moved slightly  Orthopedic: Tenderness to palpation noted about the surgical site.   Multiple view plain film radiographs: No appreciable complication of hardware since Nancy immediate postop films  Culture grew MSSA sensitive to doxycycline Assessment:   1. Edema of right lower extremity   2. Closed fracture of base of metatarsal bone with routine healing, left   3. Dislocation of tarsometatarsal joint of left foot, subsequent encounter    Plan:  Patient was evaluated and treated and all questions answered.  S/p foot surgery left -Agree with Nancy Brooks that Nancy leaving the facility and coming to home would be quite detrimental to Nancy care and progress.  I have written a letter of medical  necessity and faxed to the Medicare claim that Nancy Brooks provided in support of this that she can stay through the second stage of surgery and for the postoperative course. -Rx for doxycycline was given to Nancy Brooks as well as Nancy to restart this on Monday and continue until surgery -We discussed the second stage of Nancy surgery with arthrodesis using internal fixation of the first second and third tarsal metatarsal joints.  We discussed the risk benefits and potential complications including but not limited to pain, swelling, infection, scar, numbness which may be temporary or permanent, chronic pain, stiffness, nerve pain or damage, wound healing problems, bone healing problems including delayed or non-union.  Informed consent was signed and reviewed.  Surgery is now scheduled for an outpatient procedure and hopeful return to Nancy facility currently after discharge.  If Nancy appeal is denied we will plan to admit postoperatively and try to have Nancy re-placed into subacute rehab.   No follow-ups on file.

## 2021-01-19 NOTE — Telephone Encounter (Signed)
Last CBC noted in [facility] is 8/11 Have you received any results that have not been scanned in ?

## 2021-01-19 NOTE — Telephone Encounter (Signed)
Left detailed vm with nurses station at [facility]  to fax over results. I will contact them again in the morning if nothing is faxed over.    [phone]

## 2021-01-19 NOTE — Telephone Encounter (Signed)
Nancy Brooks looked through my box and could not find CBC results.

## 2021-01-20 ENCOUNTER — Encounter (HOSPITAL_BASED_OUTPATIENT_CLINIC_OR_DEPARTMENT_OTHER): Payer: Self-pay | Admitting: Podiatry

## 2021-01-20 NOTE — Progress Notes (Signed)
Spoke with kim at [facility]  and medication record and records to be faxed, pt is nonweight bearing  ob left with boot on 1 person transfer in wheel chair.

## 2021-01-21 NOTE — Progress Notes (Signed)
Spoke with patient POA janet darnell sister cell [phone] who signs consents and patient is to be moved to [facility] for surgery next week and this was confirmed by shelley at dr Sherryle Lis office.

## 2021-01-22 ENCOUNTER — Encounter: Payer: Self-pay | Admitting: Podiatry

## 2021-01-22 ENCOUNTER — Other Ambulatory Visit: Payer: Self-pay | Admitting: Psychiatry

## 2021-01-22 DIAGNOSIS — F200 Paranoid schizophrenia: Secondary | ICD-10-CM

## 2021-01-22 MED ORDER — CLOZAPINE 100 MG PO TABS: ORAL_TABLET | ORAL | 11 refills | Status: DC

## 2021-01-22 NOTE — Telephone Encounter (Signed)
Got it. Thank you.

## 2021-01-25 ENCOUNTER — Encounter: Payer: Self-pay | Admitting: Internal Medicine

## 2021-01-28 ENCOUNTER — Other Ambulatory Visit: Payer: Self-pay

## 2021-01-28 ENCOUNTER — Encounter (HOSPITAL_COMMUNITY): Payer: Self-pay | Admitting: Podiatry

## 2021-01-28 NOTE — Progress Notes (Signed)
For Short Stay: [place] appointment date: arriving earlier for testing in short stay 1245 Date of COVID positive in last 90 days: No   For Anesthesia: PCP - Hoyt Koch, MD Cardiologist - N/A  Chest x-ray - N/A EKG - greater than 1 year Stress Test - N/A ECHO - N/A Cardiac Cath - N/A Pacemaker/ICD device last checked:N/A  Sleep Study - N/A CPAP - N/A  Fasting Blood Sugar - N/A Checks Blood Sugar _N/A____ times a day  Blood Thinner Instructions: N/A Aspirin Instructions: N/A Last Dose: N/A  Activity level:  Able to exercise without chest pain and/or shortness of breath    Anesthesia review: N/A  Patient denies shortness of breath, fever, cough and chest pain at PAT appointment   Patient verbalized understanding of instructions that were given to them at the PAT appointment. Patient was also instructed that they will need to review over the PAT instructions again at home before surgery.

## 2021-01-28 NOTE — Progress Notes (Signed)
Requested Nancy Brooks bring in [place] documentation on 01/2321, she verbalized understanding.

## 2021-01-29 ENCOUNTER — Encounter (HOSPITAL_COMMUNITY): Payer: Self-pay | Admitting: Podiatry

## 2021-01-29 ENCOUNTER — Ambulatory Visit (HOSPITAL_COMMUNITY): Payer: Medicare Other | Admitting: Physician Assistant

## 2021-01-29 ENCOUNTER — Inpatient Hospital Stay (HOSPITAL_COMMUNITY)
Admission: RE | Admit: 2021-01-29 | Discharge: 2021-02-06 | DRG: 505 | Disposition: A | Discharge status: Skilled Nursing Facility | Payer: Medicare Other | Attending: Family Medicine | Admitting: Family Medicine

## 2021-01-29 ENCOUNTER — Encounter: Payer: Self-pay | Admitting: Podiatry

## 2021-01-29 ENCOUNTER — Other Ambulatory Visit: Payer: Self-pay

## 2021-01-29 ENCOUNTER — Encounter (HOSPITAL_COMMUNITY)
Admission: RE | Disposition: A | Discharge status: Skilled Nursing Facility | Payer: Self-pay | Source: Home / Self Care | Attending: Internal Medicine

## 2021-01-29 DIAGNOSIS — S93325A Dislocation of tarsometatarsal joint of left foot, initial encounter: Principal | ICD-10-CM | POA: Diagnosis present

## 2021-01-29 DIAGNOSIS — T1490XD Injury, unspecified, subsequent encounter: Secondary | ICD-10-CM | POA: Diagnosis not present

## 2021-01-29 DIAGNOSIS — R269 Unspecified abnormalities of gait and mobility: Secondary | ICD-10-CM | POA: Diagnosis present

## 2021-01-29 DIAGNOSIS — F209 Schizophrenia, unspecified: Secondary | ICD-10-CM | POA: Diagnosis present

## 2021-01-29 DIAGNOSIS — F32A Depression, unspecified: Secondary | ICD-10-CM | POA: Diagnosis present

## 2021-01-29 DIAGNOSIS — Z8042 Family history of malignant neoplasm of prostate: Secondary | ICD-10-CM

## 2021-01-29 DIAGNOSIS — M19172 Post-traumatic osteoarthritis, left ankle and foot: Secondary | ICD-10-CM | POA: Diagnosis present

## 2021-01-29 DIAGNOSIS — D72829 Elevated white blood cell count, unspecified: Secondary | ICD-10-CM | POA: Diagnosis not present

## 2021-01-29 DIAGNOSIS — S92312S Displaced fracture of first metatarsal bone, left foot, sequela: Secondary | ICD-10-CM

## 2021-01-29 DIAGNOSIS — Z800 Family history of malignant neoplasm of digestive organs: Secondary | ICD-10-CM | POA: Diagnosis not present

## 2021-01-29 DIAGNOSIS — S93325D Dislocation of tarsometatarsal joint of left foot, subsequent encounter: Secondary | ICD-10-CM | POA: Diagnosis present

## 2021-01-29 DIAGNOSIS — Z8249 Family history of ischemic heart disease and other diseases of the circulatory system: Secondary | ICD-10-CM

## 2021-01-29 DIAGNOSIS — Z79899 Other long term (current) drug therapy: Secondary | ICD-10-CM | POA: Diagnosis not present

## 2021-01-29 DIAGNOSIS — F419 Anxiety disorder, unspecified: Secondary | ICD-10-CM | POA: Diagnosis present

## 2021-01-29 DIAGNOSIS — K5909 Other constipation: Secondary | ICD-10-CM | POA: Diagnosis present

## 2021-01-29 DIAGNOSIS — F411 Generalized anxiety disorder: Secondary | ICD-10-CM

## 2021-01-29 DIAGNOSIS — Z20822 Contact with and (suspected) exposure to covid-19: Secondary | ICD-10-CM | POA: Diagnosis present

## 2021-01-29 DIAGNOSIS — S93326A Dislocation of tarsometatarsal joint of unspecified foot, initial encounter: Secondary | ICD-10-CM | POA: Diagnosis present

## 2021-01-29 DIAGNOSIS — S92302A Fracture of unspecified metatarsal bone(s), left foot, initial encounter for closed fracture: Secondary | ICD-10-CM

## 2021-01-29 DIAGNOSIS — E785 Hyperlipidemia, unspecified: Secondary | ICD-10-CM | POA: Diagnosis present

## 2021-01-29 HISTORY — PX: ARTHRODESIS METATARSAL: SHX6565

## 2021-01-29 LAB — CBC
HCT: 39 % (ref 36.0–46.0)
Hemoglobin: 12.5 g/dL (ref 12.0–15.0)
MCH: 28.9 pg (ref 26.0–34.0)
MCHC: 32.1 g/dL (ref 30.0–36.0)
MCV: 90.3 fL (ref 80.0–100.0)
Platelets: 248 10*3/uL (ref 150–400)
RBC: 4.32 MIL/uL (ref 3.87–5.11)
RDW: 14.5 % (ref 11.5–15.5)
WBC: 7.6 10*3/uL (ref 4.0–10.5)
nRBC: 0 % (ref 0.0–0.2)

## 2021-01-29 LAB — SARS CORONAVIRUS 2 BY RT PCR (HOSPITAL ORDER, PERFORMED IN ~~LOC~~ HOSPITAL LAB): SARS Coronavirus 2: NEGATIVE

## 2021-01-29 LAB — CREATININE, SERUM
Creatinine, Ser: 0.81 mg/dL (ref 0.44–1.00)
GFR, Estimated: >60 mL/min (ref >60)

## 2021-01-29 SURGERY — FUSION, JOINT, INVOLVING METATARSAL BONE
Anesthesia: General | Site: Foot | Laterality: Left

## 2021-01-29 MED ORDER — ASPIRIN 325 MG PO TABS
325.0000 mg | ORAL_TABLET | Freq: Two times a day (BID) | ORAL | Status: DC
  Administered 2021-01-30 – 2021-02-06 (×15): 325 mg via ORAL
  Filled 2021-01-29 (×16): qty 1

## 2021-01-29 MED ORDER — CLOZAPINE 100 MG PO TABS
100.0000 mg | ORAL_TABLET | Freq: Every day | ORAL | Status: DC
  Administered 2021-01-30 – 2021-02-06 (×8): 100 mg via ORAL
  Filled 2021-01-29 (×8): qty 1

## 2021-01-29 MED ORDER — OXYCODONE HCL 5 MG/5ML PO SOLN: 5.0000 mg | Freq: Once | ORAL | Status: DC | PRN

## 2021-01-29 MED ORDER — CEFAZOLIN SODIUM-DEXTROSE 1-4 GM/50ML-% IV SOLN
1.0000 g | Freq: Three times a day (TID) | INTRAVENOUS | Status: AC
  Administered 2021-01-29 – 2021-01-30 (×3): 1 g via INTRAVENOUS
  Filled 2021-01-29 (×5): qty 50

## 2021-01-29 MED ORDER — EPHEDRINE SULFATE-NACL 50-0.9 MG/10ML-% IV SOSY
PREFILLED_SYRINGE | INTRAVENOUS | Status: DC | PRN
  Administered 2021-01-29: 5 mg via INTRAVENOUS

## 2021-01-29 MED ORDER — CHLORHEXIDINE GLUCONATE 0.12 % MT SOLN
15.0000 mL | Freq: Once | OROMUCOSAL | Status: AC
  Administered 2021-01-29: 15 mL via OROMUCOSAL

## 2021-01-29 MED ORDER — LIDOCAINE HCL (PF) 2 % IJ SOLN
INTRAMUSCULAR | Status: AC
  Filled 2021-01-29: qty 5

## 2021-01-29 MED ORDER — FENTANYL CITRATE PF 50 MCG/ML IJ SOSY
PREFILLED_SYRINGE | INTRAMUSCULAR | Status: AC
  Filled 2021-01-29: qty 1

## 2021-01-29 MED ORDER — BUPIVACAINE-EPINEPHRINE (PF) 0.5% -1:200000 IJ SOLN
INTRAMUSCULAR | Status: DC | PRN
  Administered 2021-01-29: 25 mL via PERINEURAL

## 2021-01-29 MED ORDER — MIDAZOLAM HCL 2 MG/2ML IJ SOLN
INTRAMUSCULAR | Status: AC
  Filled 2021-01-29: qty 2

## 2021-01-29 MED ORDER — PHENYLEPHRINE 40 MCG/ML (10ML) SYRINGE FOR IV PUSH (FOR BLOOD PRESSURE SUPPORT)
PREFILLED_SYRINGE | INTRAVENOUS | Status: AC
  Filled 2021-01-29: qty 10

## 2021-01-29 MED ORDER — SIMVASTATIN 20 MG PO TABS
40.0000 mg | ORAL_TABLET | Freq: Every day | ORAL | Status: DC
  Administered 2021-01-30 – 2021-02-05 (×7): 40 mg via ORAL
  Filled 2021-01-29 (×7): qty 2

## 2021-01-29 MED ORDER — POLYETHYLENE GLYCOL 3350 17 G PO PACK
17.0000 g | PACK | Freq: Every day | ORAL | Status: DC
  Administered 2021-01-30 – 2021-02-05 (×7): 17 g via ORAL
  Filled 2021-01-29 (×7): qty 1

## 2021-01-29 MED ORDER — ONDANSETRON HCL 4 MG/2ML IJ SOLN
INTRAMUSCULAR | Status: DC | PRN
  Administered 2021-01-29: 4 mg via INTRAVENOUS

## 2021-01-29 MED ORDER — ONDANSETRON HCL 4 MG/2ML IJ SOLN
INTRAMUSCULAR | Status: AC
  Filled 2021-01-29: qty 2

## 2021-01-29 MED ORDER — ENOXAPARIN SODIUM 40 MG/0.4ML IJ SOSY
40.0000 mg | PREFILLED_SYRINGE | INTRAMUSCULAR | Status: DC
  Administered 2021-01-30: 40 mg via SUBCUTANEOUS
  Filled 2021-01-29: qty 0.4

## 2021-01-29 MED ORDER — CEFAZOLIN SODIUM-DEXTROSE 2-4 GM/100ML-% IV SOLN
2.0000 g | INTRAVENOUS | Status: AC
  Administered 2021-01-29: 2 g via INTRAVENOUS
  Filled 2021-01-29: qty 100

## 2021-01-29 MED ORDER — ONDANSETRON HCL 4 MG/2ML IJ SOLN
4.0000 mg | Freq: Four times a day (QID) | INTRAMUSCULAR | Status: DC | PRN
Start: 2021-01-29 — End: 2021-01-29

## 2021-01-29 MED ORDER — ORAL CARE MOUTH RINSE: 15.0000 mL | Freq: Once | OROMUCOSAL | Status: AC

## 2021-01-29 MED ORDER — PROPOFOL 10 MG/ML IV BOLUS
INTRAVENOUS | Status: AC
  Filled 2021-01-29: qty 20

## 2021-01-29 MED ORDER — OXYCODONE HCL 5 MG PO TABS
5.0000 mg | ORAL_TABLET | Freq: Four times a day (QID) | ORAL | Status: DC | PRN
Start: 2021-01-29 — End: 2021-02-06
  Administered 2021-01-29 – 2021-02-03 (×6): 5 mg via ORAL
  Filled 2021-01-29 (×7): qty 1

## 2021-01-29 MED ORDER — IBUPROFEN 400 MG PO TABS
600.0000 mg | ORAL_TABLET | Freq: Four times a day (QID) | ORAL | Status: DC | PRN
  Administered 2021-02-04: 600 mg via ORAL
  Filled 2021-01-29: qty 1

## 2021-01-29 MED ORDER — DEXAMETHASONE SODIUM PHOSPHATE 10 MG/ML IJ SOLN
INTRAMUSCULAR | Status: AC
  Filled 2021-01-29: qty 1

## 2021-01-29 MED ORDER — PROPOFOL 10 MG/ML IV BOLUS
INTRAVENOUS | Status: DC | PRN
  Administered 2021-01-29: 100 mg via INTRAVENOUS

## 2021-01-29 MED ORDER — OXYCODONE HCL 5 MG PO TABS: 5.0000 mg | ORAL_TABLET | Freq: Once | ORAL | Status: DC | PRN

## 2021-01-29 MED ORDER — SUGAMMADEX SODIUM 200 MG/2ML IV SOLN: INTRAVENOUS | Status: DC | PRN

## 2021-01-29 MED ORDER — CLOZAPINE 100 MG PO TABS
400.0000 mg | ORAL_TABLET | Freq: Every day | ORAL | Status: DC
  Administered 2021-01-29 – 2021-02-05 (×8): 400 mg via ORAL
  Filled 2021-01-29 (×8): qty 4

## 2021-01-29 MED ORDER — SODIUM CHLORIDE 0.9% FLUSH: 3.0000 mL | INTRAVENOUS | Status: DC | PRN

## 2021-01-29 MED ORDER — SENNA 8.6 MG PO TABS
1.0000 | ORAL_TABLET | Freq: Every day | ORAL | Status: DC
  Administered 2021-01-29 – 2021-02-04 (×7): 8.6 mg via ORAL
  Filled 2021-01-29 (×7): qty 1

## 2021-01-29 MED ORDER — FENTANYL CITRATE PF 50 MCG/ML IJ SOSY: 25.0000 ug | PREFILLED_SYRINGE | INTRAMUSCULAR | Status: DC | PRN

## 2021-01-29 MED ORDER — PHENYLEPHRINE 40 MCG/ML (10ML) SYRINGE FOR IV PUSH (FOR BLOOD PRESSURE SUPPORT)
PREFILLED_SYRINGE | INTRAVENOUS | Status: DC | PRN
  Administered 2021-01-29 (×4): 80 ug via INTRAVENOUS

## 2021-01-29 MED ORDER — MIDAZOLAM HCL 2 MG/2ML IJ SOLN
1.0000 mg | INTRAMUSCULAR | Status: DC
Start: 2021-01-29 — End: 2021-02-06

## 2021-01-29 MED ORDER — EPHEDRINE 5 MG/ML INJ
INTRAVENOUS | Status: AC
  Filled 2021-01-29: qty 5

## 2021-01-29 MED ORDER — LIDOCAINE 2% (20 MG/ML) 5 ML SYRINGE
INTRAMUSCULAR | Status: DC | PRN
  Administered 2021-01-29: 60 mg via INTRAVENOUS

## 2021-01-29 MED ORDER — LINACLOTIDE 72 MCG PO CAPS
72.0000 ug | ORAL_CAPSULE | Freq: Every day | ORAL | Status: DC
  Administered 2021-01-30 – 2021-02-06 (×8): 72 ug via ORAL
  Filled 2021-01-29 (×8): qty 1

## 2021-01-29 MED ORDER — FAMOTIDINE 20 MG PO TABS
10.0000 mg | ORAL_TABLET | Freq: Every day | ORAL | Status: DC
  Administered 2021-01-30 – 2021-02-06 (×8): 10 mg via ORAL
  Filled 2021-01-29 (×8): qty 1

## 2021-01-29 MED ORDER — DEXAMETHASONE SODIUM PHOSPHATE 10 MG/ML IJ SOLN
INTRAMUSCULAR | Status: DC | PRN
  Administered 2021-01-29: 10 mg via INTRAVENOUS

## 2021-01-29 MED ORDER — FENTANYL CITRATE (PF) 100 MCG/2ML IJ SOLN
INTRAMUSCULAR | Status: DC | PRN
  Administered 2021-01-29: 50 ug via INTRAVENOUS

## 2021-01-29 MED ORDER — PROPOFOL 500 MG/50ML IV EMUL
INTRAVENOUS | Status: AC
  Filled 2021-01-29: qty 50

## 2021-01-29 MED ORDER — LACTATED RINGERS IV SOLN: INTRAVENOUS | Status: DC

## 2021-01-29 MED ORDER — MIDAZOLAM HCL 5 MG/5ML IJ SOLN
INTRAMUSCULAR | Status: DC | PRN
  Administered 2021-01-29: 2 mg via INTRAVENOUS

## 2021-01-29 MED ORDER — 0.9 % SODIUM CHLORIDE (POUR BTL) OPTIME
TOPICAL | Status: DC | PRN
  Administered 2021-01-29: 1000 mL

## 2021-01-29 MED ORDER — FENTANYL CITRATE PF 50 MCG/ML IJ SOSY
50.0000 ug | PREFILLED_SYRINGE | INTRAMUSCULAR | Status: DC
  Administered 2021-01-29: 50 ug via INTRAVENOUS

## 2021-01-29 MED ORDER — SODIUM CHLORIDE 0.9% FLUSH
3.0000 mL | Freq: Two times a day (BID) | INTRAVENOUS | Status: DC
  Administered 2021-01-29 – 2021-02-06 (×9): 3 mL via INTRAVENOUS

## 2021-01-29 MED ORDER — SODIUM CHLORIDE 0.9 % IV SOLN: 250.0000 mL | INTRAVENOUS | Status: DC | PRN

## 2021-01-29 MED ORDER — FENTANYL CITRATE (PF) 100 MCG/2ML IJ SOLN
INTRAMUSCULAR | Status: AC
  Filled 2021-01-29: qty 2

## 2021-01-29 MED ORDER — CHLORDIAZEPOXIDE HCL 5 MG PO CAPS
10.0000 mg | ORAL_CAPSULE | Freq: Two times a day (BID) | ORAL | Status: DC
  Administered 2021-01-29 – 2021-02-06 (×16): 10 mg via ORAL
  Filled 2021-01-29 (×16): qty 2

## 2021-01-29 SURGICAL SUPPLY — 77 items
APL PRP STRL LF DISP 70% ISPRP (MISCELLANEOUS) ×1
BAG COUNTER SPONGE SURGICOUNT (BAG) IMPLANT
BAG SPNG CNTER NS LX DISP (BAG)
BANDAGE ESMARK 6X9 LF (GAUZE/BANDAGES/DRESSINGS) IMPLANT
BIT DRILL 2.6 (BIT) ×2
BIT DRILL 2.6X VARIAX 2 (BIT) IMPLANT
BIT DRILL ASSY BONE GRAFT 7 (ORTHOPEDIC DISPOSABLE SUPPLIES) IMPLANT
BIT DRL 2.6X VARIAX 2 (BIT) ×1
BLADE MICRO SAGITTAL (BLADE) ×1 IMPLANT
BLADE OSC/SAGITTAL MD 5.5X18 (BLADE) ×1 IMPLANT
BLADE OSCILLATING/SAGITTAL (BLADE)
BLADE SURG 15 STRL LF DISP TIS (BLADE) ×2 IMPLANT
BLADE SURG 15 STRL SS (BLADE) ×4
BLADE SW THK.38XMED LNG THN (BLADE) ×1 IMPLANT
BNDG CMPR 9X4 STRL LF SNTH (GAUZE/BANDAGES/DRESSINGS) ×1
BNDG CMPR 9X6 STRL LF SNTH (GAUZE/BANDAGES/DRESSINGS)
BNDG COHESIVE 4X5 TAN ST LF (GAUZE/BANDAGES/DRESSINGS) ×2 IMPLANT
BNDG COHESIVE 6X5 TAN ST LF (GAUZE/BANDAGES/DRESSINGS) ×2 IMPLANT
BNDG CONFORM 3 STRL LF (GAUZE/BANDAGES/DRESSINGS) ×2 IMPLANT
BNDG ELASTIC 4X5.8 VLCR STR LF (GAUZE/BANDAGES/DRESSINGS) ×1 IMPLANT
BNDG ESMARK 4X9 LF (GAUZE/BANDAGES/DRESSINGS) ×1 IMPLANT
BNDG ESMARK 6X9 LF (GAUZE/BANDAGES/DRESSINGS)
BNDG GAUZE ELAST 4 BULKY (GAUZE/BANDAGES/DRESSINGS) ×1 IMPLANT
CHLORAPREP W/TINT 26 (MISCELLANEOUS) ×2 IMPLANT
COVER BACK TABLE 60X90IN (DRAPES) ×2 IMPLANT
CUFF TOURN SGL QUICK 18X4 (TOURNIQUET CUFF) ×1 IMPLANT
DRAPE OEC MINIVIEW 54X84 (DRAPES) ×2 IMPLANT
DRAPE SHEET LG 3/4 BI-LAMINATE (DRAPES) ×4 IMPLANT
DRAPE U-SHAPE 47X51 STRL (DRAPES) ×2 IMPLANT
DRILL ASSY BONE GRAFT 7 (ORTHOPEDIC DISPOSABLE SUPPLIES) ×2
DRSG MEPITEL 4X7.2 (GAUZE/BANDAGES/DRESSINGS) ×2 IMPLANT
DRSG PAD ABDOMINAL 8X10 ST (GAUZE/BANDAGES/DRESSINGS) ×3 IMPLANT
ELECT REM PT RETURN 15FT ADLT (MISCELLANEOUS) ×2 IMPLANT
GAUZE SPONGE 4X4 12PLY STRL (GAUZE/BANDAGES/DRESSINGS) ×2 IMPLANT
GAUZE XEROFORM 5X9 LF (GAUZE/BANDAGES/DRESSINGS) ×1 IMPLANT
GLOVE SURG ENC TEXT LTX SZ7 (GLOVE) ×2 IMPLANT
GLOVE SURG NEOPR MICRO LF SZ8 (GLOVE) ×2 IMPLANT
GLOVE SURG UNDER POLY LF SZ7.5 (GLOVE) ×2 IMPLANT
GOWN STRL REUS W/ TWL LRG LVL3 (GOWN DISPOSABLE) ×1 IMPLANT
GOWN STRL REUS W/ TWL XL LVL3 (GOWN DISPOSABLE) ×2 IMPLANT
GOWN STRL REUS W/TWL LRG LVL3 (GOWN DISPOSABLE) ×2
GOWN STRL REUS W/TWL XL LVL3 (GOWN DISPOSABLE) ×4
K-WIRE OLIVE STOP (WIRE) ×4
K-WIRE SMOOTH 2.0X150 (WIRE) ×4
KIT BASIN OR (CUSTOM PROCEDURE TRAY) ×2 IMPLANT
KIT INSTR EASYFUSE HINDFOOT (INSTRUMENTS) ×1 IMPLANT
KIT TURNOVER KIT A (KITS) ×2 IMPLANT
KWIRE OLIVE STOP (WIRE) IMPLANT
KWIRE SMOOTH 2.0X150 (WIRE) IMPLANT
NEEDLE HYPO 22GX1.5 SAFETY (NEEDLE) IMPLANT
PACK ORTHO EXTREMITY (CUSTOM PROCEDURE TRAY) ×2 IMPLANT
PAD CAST 4YDX4 CTTN HI CHSV (CAST SUPPLIES) ×1 IMPLANT
PADDING CAST COTTON 4X4 STRL (CAST SUPPLIES) ×2
PADDING CAST COTTON 6X4 STRL (CAST SUPPLIES) ×2 IMPLANT
PENCIL SMOKE EVACUATOR (MISCELLANEOUS) IMPLANT
PLATE BROAD STRT 5H (Plate) ×1 IMPLANT
PUTTY DBM STAGRAFT PLUS 10CC (Putty) ×1 IMPLANT
SCREW LOCK FT 3.5X18 (Screw) ×1 IMPLANT
SCREW LOCK FT 3.5X24 (Screw) ×2 IMPLANT
SCREW LOCK THRD T10 3.5XL22 (Screw) ×1 IMPLANT
SLEEVE SCD COMPRESS KNEE MED (STOCKING) ×2 IMPLANT
SPONGE T-LAP 18X18 ~~LOC~~+RFID (SPONGE) ×2 IMPLANT
STAPLE COMPR EASYFUSE 20X15 (Staple) ×1 IMPLANT
STAPLE COMPR EASYFUSE 20X20 (Staple) ×1 IMPLANT
STAPLE COMPR EASYFUSE 25X20 (Staple) ×1 IMPLANT
STOCKINETTE 8 INCH (MISCELLANEOUS) ×2 IMPLANT
SUCTION FRAZIER HANDLE 10FR (MISCELLANEOUS) ×2
SUCTION TUBE FRAZIER 10FR DISP (MISCELLANEOUS) ×1 IMPLANT
SUT ETHILON 3 0 PS 1 (SUTURE) ×2 IMPLANT
SUT MNCRL AB 3-0 PS2 18 (SUTURE) ×2 IMPLANT
SUT MNCRL AB 4-0 PS2 18 (SUTURE) ×2 IMPLANT
SUT VIC AB 2-0 SH 27 (SUTURE) ×4
SUT VIC AB 2-0 SH 27X BRD (SUTURE) ×1 IMPLANT
SUT VIC AB 2-0 SH 27XBRD (SUTURE) ×1 IMPLANT
SYR CONTROL 10ML LL (SYRINGE) IMPLANT
TUBING CONNECTING 10 (TUBING) ×2 IMPLANT
UNDERPAD 30X36 HEAVY ABSORB (UNDERPADS AND DIAPERS) ×2 IMPLANT

## 2021-01-29 NOTE — Anesthesia Preprocedure Evaluation (Signed)
Anesthesia Evaluation  Patient identified by MRN, date of birth, ID band Patient awake    Reviewed: Allergy & Precautions, H&P , NPO status , Patient's Chart, lab work & pertinent test results  Airway Mallampati: II   Neck ROM: full    Dental   Pulmonary neg pulmonary ROS,    breath sounds clear to auscultation       Cardiovascular negative cardio ROS   Rhythm:regular Rate:Normal     Neuro/Psych PSYCHIATRIC DISORDERS Anxiety Depression Schizophrenia    GI/Hepatic GERD  ,  Endo/Other    Renal/GU      Musculoskeletal   Abdominal   Peds  Hematology   Anesthesia Other Findings   Reproductive/Obstetrics                             Anesthesia Physical Anesthesia Plan  ASA: 2  Anesthesia Plan: General   Post-op Pain Management:  Regional for Post-op pain   Induction: Intravenous  PONV Risk Score and Plan: 3 and Ondansetron, Dexamethasone, Midazolam and Treatment may vary due to age or medical condition  Airway Management Planned: LMA  Additional Equipment:   Intra-op Plan:   Post-operative Plan: Extubation in OR  Informed Consent: I have reviewed the patients History and Physical, chart, labs and discussed the procedure including the risks, benefits and alternatives for the proposed anesthesia with the patient or authorized representative who has indicated his/her understanding and acceptance.     Dental advisory given  Plan Discussed with: CRNA, Anesthesiologist and Surgeon  Anesthesia Plan Comments:         Anesthesia Quick Evaluation

## 2021-01-29 NOTE — Transfer of Care (Addendum)
Immediate Anesthesia Transfer of Care Note  Patient: Nancy Brooks  Procedure(s) Performed: Procedure(s) with comments: ARTHRODESIS TARSOMETATARSAL LEFT FOOT (Left) - GENERAL WITH BLOCK  Patient Location: PACU  Anesthesia Type:General  Level of Consciousness: Alert, Awake, Oriented  Airway & Oxygen Therapy: Patient Spontanous Breathing  Post-op Assessment: Report given to RN  Post vital signs: Reviewed and stable  Last Vitals:  Vitals:   01/29/21 1555 01/29/21 1600  BP: (!) 105/55 (!) 114/56  Pulse: (!) 101 (!) 102  Resp: 15 14  Temp:    SpO2: 599% 357%    Complications: No apparent anesthesia complications

## 2021-01-29 NOTE — Anesthesia Procedure Notes (Signed)
Anesthesia Regional Block: Popliteal block   Pre-Anesthetic Checklist: , timeout performed,  Correct Patient, Correct Site, Correct Laterality,  Correct Procedure, Correct Position, site marked,  Risks and benefits discussed,  Surgical consent,  Pre-op evaluation,  At surgeon's request and post-op pain management  Laterality: Left  Prep: chloraprep       Needles:  Injection technique: Single-shot  Needle Type: Echogenic Stimulator Needle          Additional Needles:   Procedures:, nerve stimulator,,,,,     Nerve Stimulator or Paresthesia:  Response: plantar flexion of foot, 0.45 mA  Additional Responses:   Narrative:  Start time: 01/29/2021 2:40 PM End time: 01/29/2021 2:50 PM Injection made incrementally with aspirations every 5 mL.  Performed by: Personally  Anesthesiologist: Albertha Ghee, MD  Additional Notes: Functioning IV was confirmed and monitors were applied.  A 91mm 21ga Arrow echogenic stimulator needle was used. Sterile prep and drape,hand hygiene and sterile gloves were used.  Negative aspiration and negative test dose prior to incremental administration of local anesthetic. The patient tolerated the procedure well.  Ultrasound guidance: relevent anatomy identified, needle position confirmed, local anesthetic spread visualized around nerve(s), vascular puncture avoided.  Image printed for medical record.

## 2021-01-29 NOTE — Anesthesia Procedure Notes (Signed)
Procedure Name: LMA Insertion Date/Time: 01/29/2021 4:34 PM Performed by: Penney Domanski D, CRNA Pre-anesthesia Checklist: Patient identified, Emergency Drugs available, Suction available and Patient being monitored Patient Re-evaluated:Patient Re-evaluated prior to induction Oxygen Delivery Method: Circle system utilized Preoxygenation: Pre-oxygenation with 100% oxygen Induction Type: IV induction Ventilation: Mask ventilation without difficulty LMA: LMA inserted LMA Size: 3.0 Tube type: Oral Number of attempts: 1 Placement Confirmation: positive ETCO2 and breath sounds checked- equal and bilateral Tube secured with: Tape Dental Injury: Teeth and Oropharynx as per pre-operative assessment

## 2021-01-29 NOTE — Discharge Instructions (Signed)
Post-Surgery Instructions  1. If you are recuperating from surgery anywhere other than home, please be sure to leave [place] a number where you can be reached. 2. Go directly home and rest. 3. The keep operated foot (or feet) elevated six inches above the hip when sitting or lying down. 4. Support the elevated foot and leg with pillows under the calf. DO NOT PLACE PILLOWS UNDER THE KNEE. 5. DO NOT REMOVE or get your bandages wet. This will increase your chances of getting an infection. 6. Wear your surgical boot at all times when you are up. 7. A limited amount of pain and swelling may occur. The skin may take on a bruised appearance. This is no cause for alarm. 8. For slight pain and swelling, apply an ice pack directly over the bandage for 15 minutes every hour. Continue icing until seen in the office. DO NOT apply any form of heat to the area. 9. Have prescription(s) filled immediately and take as directed. 10. Drink lots of liquids, water, and juice. 11. CALL THE OFFICE IMMEDIATELY IF: a. Bleeding continues b. Pain increases and/or does not respond to medication c. Bandage or cast appears too tight d. Any liquids (water, coffee, etc.) have spilled on your bandages. e. Tripping, falling, or stubbing the surgical foot f. If your temperature rises above 101 g. If you have ANY questions at all 12. Please use the crutches, knee scooter, or walker you have prescribed, rented, or purchased. If you are non-weight bearing DO NOT put weight on the operated foot for 30 days. If you are weight-bearing, follow your physician's instructions. You are expected to be: ? weight-bearing ? non-weight bearing 13. Special Instructions: _____________________________________________________________ _________________________________________________________________________________ _________________________________________________________________________________  14. Your next appointment is: 02/04/2021 8:15  AM  If you need to reach the nurse for any reason, please call: [place]: [phone] [place]: [phone] [place]: [phone]

## 2021-01-29 NOTE — Progress Notes (Signed)
Time out completed AssistedDr. [place] with right, ultrasound guided, popliteal block. Side rails up, monitors on throughout procedure. See vital signs in flow sheet. Tolerated Procedure well.
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
## 2021-01-29 NOTE — Interval H&P Note (Signed)
History and Physical Interval Note:  01/29/2021 3:37 PM  Nancy Brooks  has presented today for surgery, with the diagnosis of METATARSAL FRACTURE LEFT FOOT.  The various methods of treatment have been discussed with the patient and family. After consideration of risks, benefits and other options for treatment, the patient has consented to  Procedure(s) with comments: ARTHRODESIS TARSOMETATARSAL LEFT FOOT (Left) - GENERAL WITH BLOCK as a surgical intervention.  The patient's history has been reviewed, patient examined, no change in status, stable for surgery.  I have reviewed the patient's chart and labs.  Questions were answered to the patient's satisfaction.     [name]

## 2021-01-29 NOTE — H&P (Signed)
History and Physical    Nancy Brooks SEG:[id number] DOB: 1950-12-30 DOA: 01/29/2021  PCP: Hoyt Koch, MD  Patient coming from: home   Chief Complaint: leg pain  HPI: Nancy Brooks is a 70 y.o. female with medical history significant for schizophrenia who presents with the above.  History lisfranc injury left foot. Had first stage of repair last month and discharged to snf. Here back today for second stage of repair with podiatry. She complains of chronic left foot pain, no other complaints. Sister is involved with her care. No abdominal pain, no chest pain, no cough or fever    Review of Systems: As per HPI otherwise 10 point review of systems negative.    Past Medical History:  Diagnosis Date   Anxiety    BURSITIS, RIGHT KNEE    Depression    Dyskinesia of esophagus    GAIT DISTURBANCE    GERD    HEMORRHOIDS, NOS    HYPERLIPIDEMIA    MENOPAUSAL SYNDROME    Osteoporosis dx 03/2011   DEXA at gyn, started boniva in additon to Ca+D, changed to Prolia 09/2012   SCHIZOPHRENIA    SYMPTOM, INCONTINENCE, URGE     Past Surgical History:  Procedure Laterality Date   BREAST SURGERY  05/09/1998   left breast biopsy   CLOSED REDUCTION METATARSAL Left 12/11/2020   Procedure: CLOSED REDUCTION METATARSAL;  Surgeon: [name], DPM;  Location: [facility];  Service: Podiatry;  Laterality: Left;  reduction and pinning (2.64mm Steinmann pins)   COLONOSCOPY  03-16-2010     reports that she has never smoked. She has never used smokeless tobacco. She reports that she does not drink alcohol and does not use drugs.  No Known Allergies  Family History  Problem Relation Age of Onset   Hypertension Mother    Depression Mother    Prostate cancer Father    Colon cancer Brother 69   Esophageal cancer Neg Hx    Pancreatic cancer Neg Hx    Rectal cancer Neg Hx    Stomach cancer Neg Hx     Prior to Admission medications   Medication Sig Start Date End Date Taking? Authorizing  Provider  chlordiazePOXIDE (LIBRIUM) 10 MG capsule Take 1 capsule (10 mg total) by mouth in the morning and at bedtime. 07/27/20  Yes Cottle, Billey Co., MD  cloZAPine (CLOZARIL) 100 MG tablet TAKE 1 TABLET BY MOUTH EVERY MORNING AND 4 TABLETS EVERY EVENING 01/22/21  Yes Cottle, Billey Co., MD  famotidine (PEPCID) 10 MG tablet Take 10 mg by mouth daily.    Yes [provider]  LINZESS 72 MCG capsule Take 72 mcg by mouth daily. 01/25/21  Yes [provider]  polyethylene glycol (MIRALAX / GLYCOLAX) 17 g packet Take 17 g by mouth daily.   Yes [provider]  senna (SENOKOT) 8.6 MG TABS tablet Take 1 tablet by mouth at bedtime.   Yes [provider]  simvastatin (ZOCOR) 40 MG tablet TAKE 1 TABLET(40 MG) BY MOUTH DAILY AT 6 PM 09/30/20  Yes Hoyt Koch, MD  calcium elemental as carbonate (BARIATRIC TUMS ULTRA) 400 MG chewable tablet Chew 1,000 mg by mouth daily. Patient not taking: No sig reported    [provider]  cholecalciferol (VITAMIN D3) 25 MCG (1000 UNIT) tablet Take 1,000 Units by mouth daily. Patient not taking: Reported on 01/28/2021    [provider]  HYDROcodone-acetaminophen (NORCO/VICODIN) 5-325 MG tablet Take 1-2 tablets by mouth every 4 (  four) hours as needed for moderate pain. Patient not taking: No sig reported 12/15/20   Geradine Girt, DO  vitamin B-12 (CYANOCOBALAMIN) 1000 MCG tablet Take 1 tablet (1,000 mcg total) by mouth daily. Patient not taking: No sig reported 08/31/17   Hoyt Koch, MD    Physical Exam: Vitals:   01/29/21 1352 01/29/21 1411 01/29/21 1430 01/29/21 1435  BP: 119/67  126/75 126/64  Pulse: (!) 106  100 98  Resp: 16  16 12   Temp: 98.7 F (37.1 C)     TempSrc: Oral     SpO2: 94%  100% 100%  Weight:  54.4 kg    Height:  5' (1.524 m)      Constitutional: No acute distress Head: Atraumatic Eyes: Conjunctiva clear ENM: Moist mucous membranes. Normal dentition.  Neck:  Supple Respiratory: Clear to auscultation bilaterally, no wheezing/rales/rhonchi. Normal respiratory effort. No accessory muscle use. . Cardiovascular: Regular rate and rhythm. No murmurs/rubs/gallops. Abdomen: Non-tender, non-distended. No masses. No rebound or guarding. Positive bowel sounds. Musculoskeletal: left lower extremity in hard boot Skin: No rashes, lesions, or ulcers.  Extremities: No peripheral edema. Palpable peripheral pulses. Neurologic: Alert, moving all 4 extremities. Psychiatric: calm   Labs on Admission: I have personally reviewed following labs and imaging studies  CBC: No results for input(s): WBC, NEUTROABS, HGB, HCT, MCV, PLT in the last 168 hours. Basic Metabolic Panel: No results for input(s): NA, K, CL, CO2, GLUCOSE, BUN, CREATININE, CALCIUM, MG, PHOS in the last 168 hours. GFR: CrCl cannot be calculated (Patient's most recent lab result is older than the maximum 21 days allowed.). Liver Function Tests: No results for input(s): AST, ALT, ALKPHOS, BILITOT, PROT, ALBUMIN in the last 168 hours. No results for input(s): LIPASE, AMYLASE in the last 168 hours. No results for input(s): AMMONIA in the last 168 hours. Coagulation Profile: No results for input(s): INR, PROTIME in the last 168 hours. Cardiac Enzymes: No results for input(s): CKTOTAL, CKMB, CKMBINDEX, TROPONINI in the last 168 hours. BNP (last 3 results) No results for input(s): PROBNP in the last 8760 hours. HbA1C: No results for input(s): HGBA1C in the last 72 hours. CBG: No results for input(s): GLUCAP in the last 168 hours. Lipid Profile: No results for input(s): CHOL, HDL, LDLCALC, TRIG, CHOLHDL, LDLDIRECT in the last 72 hours. Thyroid Function Tests: No results for input(s): TSH, T4TOTAL, FREET4, T3FREE, THYROIDAB in the last 72 hours. Anemia Panel: No results for input(s): VITAMINB12, FOLATE, FERRITIN, TIBC, IRON, RETICCTPCT in the last 72 hours. Urine analysis:    Component Value  Date/Time   COLORURINE AMBER (A) 12/16/2020 1900   APPEARANCEUR TURBID (A) 12/16/2020 1900   LABSPEC 1.036 (H) 12/16/2020 1900   PHURINE 5.0 12/16/2020 1900   GLUCOSEU NEGATIVE 12/16/2020 1900   GLUCOSEU NEGATIVE 10/05/2011 1146   HGBUR NEGATIVE 12/16/2020 1900   BILIRUBINUR NEGATIVE 12/16/2020 1900   KETONESUR 5 (A) 12/16/2020 1900   PROTEINUR 30 (A) 12/16/2020 1900   UROBILINOGEN 0.2 10/05/2011 1146   NITRITE NEGATIVE 12/16/2020 1900   LEUKOCYTESUR NEGATIVE 12/16/2020 1900    Radiological Exams on Admission: No results found.   Assessment/Plan Active Problems:   Hyperlipidemia   Schizophrenia, unspecified type ([place])   Lisfranc dislocation   # Lisfranc injury Podiatry planning second stage of repair this afternoon - oxycodone and ibuprofen written for post-op pain control - pt/ot consults tomorrow  # Schizophrenia Attempted to contact sister who is involved with her care, no answer - cont home librium, clozapine  # Chronic constipation -  home linzess, miralax, senna  DVT prophylaxis: lovenox to start tomorrow Code Status: full, need to confirm with patient's sister  Family Communication: none at bedside, sister did not answer when called  Consults called: podiatry   Level of care: Med-Surg Status is: Inpatient  Remains inpatient appropriate because:Inpatient level of care appropriate due to severity of illness  Dispo: The patient is from: Home              Anticipated d/c is to: SNF              Patient currently is not medically stable to d/c.   Difficult to place patient No        Desma Maxim MD [facility] Pager [phone]  If 7PM-7AM, please contact night-coverage [email or web address] Password [facility]  01/29/2021, 3:14 PM

## 2021-01-29 NOTE — Brief Op Note (Signed)
01/29/2021  7:14 PM  PATIENT:  [name]  70 y.o. female  PRE-OPERATIVE DIAGNOSIS:  METATARSAL FRACTURE LEFT FOOT  POST-OPERATIVE DIAGNOSIS:  METATARSAL FRACTURE LEFT FOOT  PROCEDURE:  Procedure(s) with comments: ARTHRODESIS TARSOMETATARSAL LEFT FOOT (Left) - GENERAL WITH BLOCK  SURGEON:  Surgeon(s) and Role:    * Gerasimos Plotts, Stephan Minister, DPM - Primary    * Trula Slade, DPM - Assisting    ASSISTANTS: Dr Celesta Gentile, DPM   ANESTHESIA:   regional and general  EBL:  10 mL   BLOOD ADMINISTERED:none  DRAINS: none   LOCAL MEDICATIONS USED:  NONE  SPECIMEN:  No Specimen  DISPOSITION OF SPECIMEN:  N/A  COUNTS:  YES  TOURNIQUET:   Total Tourniquet Time Documented: Calf (Left) - 115 minutes Total: Calf (Left) - 115 minutes   DICTATION: .Note written in EPIC  PLAN OF CARE: Admit to inpatient   PATIENT DISPOSITION:  PACU - hemodynamically stable.   Delay start of Pharmacological VTE agent (>24hrs) due to surgical blood loss or risk of bleeding: yes

## 2021-01-30 DIAGNOSIS — S93325D Dislocation of tarsometatarsal joint of left foot, subsequent encounter: Secondary | ICD-10-CM | POA: Diagnosis not present

## 2021-01-30 LAB — CBC
HCT: 33.7 % — ABNORMAL LOW (ref 36.0–46.0)
Hemoglobin: 10.9 g/dL — ABNORMAL LOW (ref 12.0–15.0)
MCH: 28.9 pg (ref 26.0–34.0)
MCHC: 32.3 g/dL (ref 30.0–36.0)
MCV: 89.4 fL (ref 80.0–100.0)
Platelets: 244 10*3/uL (ref 150–400)
RBC: 3.77 MIL/uL — ABNORMAL LOW (ref 3.87–5.11)
RDW: 14.6 % (ref 11.5–15.5)
WBC: 12.2 10*3/uL — ABNORMAL HIGH (ref 4.0–10.5)
nRBC: 0 % (ref 0.0–0.2)

## 2021-01-30 LAB — DIFFERENTIAL
Abs Immature Granulocytes: 0.05 10*3/uL (ref 0.00–0.07)
Basophils Absolute: 0 10*3/uL (ref 0.0–0.1)
Basophils Relative: 0 %
Eosinophils Absolute: 0 10*3/uL (ref 0.0–0.5)
Eosinophils Relative: 0 %
Immature Granulocytes: 0 %
Lymphocytes Relative: 4 %
Lymphs Abs: 0.5 10*3/uL — ABNORMAL LOW (ref 0.7–4.0)
Monocytes Absolute: 0.2 10*3/uL (ref 0.1–1.0)
Monocytes Relative: 1 %
Neutro Abs: 11.5 10*3/uL — ABNORMAL HIGH (ref 1.7–7.7)
Neutrophils Relative %: 95 %

## 2021-01-30 LAB — BASIC METABOLIC PANEL
Anion gap: 8 (ref 5–15)
BUN: 17 mg/dL (ref 8–23)
CO2: 27 mmol/L (ref 22–32)
Calcium: 8.8 mg/dL — ABNORMAL LOW (ref 8.9–10.3)
Chloride: 103 mmol/L (ref 98–111)
Creatinine, Ser: 0.78 mg/dL (ref 0.44–1.00)
GFR, Estimated: >60 mL/min (ref >60)
Glucose, Bld: 142 mg/dL — ABNORMAL HIGH (ref 70–99)
Potassium: 4 mmol/L (ref 3.5–5.1)
Sodium: 138 mmol/L (ref 135–145)

## 2021-01-30 LAB — GLUCOSE, CAPILLARY: Glucose-Capillary: 117 mg/dL — ABNORMAL HIGH (ref 70–99)

## 2021-01-30 NOTE — TOC Initial Note (Signed)
Transition of Care [facility]) - Initial/Assessment Note   Patient Details  Name: Nancy Brooks MRN: [id number] Date of Birth: 1950-08-03  [facility]) CM/SW Contact:    Nancy Don, LCSW Phone Number: 01/30/2021, 3:48 PM  Clinical Narrative: PT evaluation recommended SNF. However, after completing a chart review, patient was admitted to [facility] on 12/17/20 for rehab. Patient may be out of covered rehab days, so will be in copay days, or may have very few left. Patient has not had 60 days of wellness since her discharge from SNF. CSW spoke with patient to discuss PT recommendations, but patient requested that CSW speak with her sister. CSW called sister, Nancy Brooks, and left VM regarding PT's recommendations. TOC to follow.  Expected Discharge Plan: Skilled Nursing Facility Barriers to Discharge: Continued Medical Work up  Patient Goals and CMS Choice CMS Medicare.gov Compare Post Acute Care list provided to:: Patient Choice offered to / list presented to : Patient  Expected Discharge Plan and Services Expected Discharge Plan: [place] In-house Referral: Clinical Social Work Post Acute Care Choice: [place] Living arrangements for the past 2 months: [place]  Prior Living Arrangements/Services Living arrangements for the past 2 months: Single Family Home Patient language and need for interpreter reviewed:: Yes Do you feel safe going back to the place where you live?: Yes      Need for Family Participation in Patient Care: Yes (Comment) Care giver support system in place?: Yes (comment) Criminal Activity/Legal Involvement Pertinent to Current Situation/Hospitalization: No - Comment as needed  Activities of Daily Living Home Assistive Devices/Equipment: Walker (specify type), Eyeglasses ADL Screening (condition at time of admission) Patient's cognitive ability adequate to safely complete daily activities?: Yes Is the patient  deaf or have difficulty hearing?: No Does the patient have difficulty seeing, even when wearing glasses/contacts?: No Does the patient have difficulty concentrating, remembering, or making decisions?: Yes Patient able to express need for assistance with ADLs?: Yes Does the patient have difficulty dressing or bathing?: No Independently performs ADLs?: Yes (appropriate for developmental age) Does the patient have difficulty walking or climbing stairs?: No Weakness of Legs: None Weakness of Arms/Hands: None  Permission Sought/Granted Permission sought to share information with : Facility Art therapist granted to share information with : Yes, Verbal Permission Granted Permission granted to share info w AGENCY: SNFs  Emotional Assessment Appearance:: Appears stated age Attitude/Demeanor/Rapport: Engaged Affect (typically observed): Accepting Orientation: : Oriented to Self, Oriented to Place, Oriented to Situation Alcohol / Substance Use: Not Applicable  Admission diagnosis:  Lisfranc dislocation [S93.326A] Patient Active Problem List   Diagnosis Date Noted   Lisfranc dislocation 01/29/2021   Chills    Constipation 12/11/2020   Foot fracture, left 12/10/2020   Dislocation of tarsometatarsal joint of left foot 11/30/2020   Incontinence of feces 03/12/2019   B12 deficiency 12/01/2017   Routine general medical examination at a health care facility 05/22/2015   Senile osteoporosis 10/05/2011   Hyperlipidemia 01/20/2009   GERD 01/20/2009   Schizophrenia, unspecified type ([place]) 07/06/2006   PCP:  Nancy Koch, MD Pharmacy:   [facility] [place], [place] - [address] [place] [address] [place] [address] Phone: [phone] Fax: [phone]  [place], [place] [address] Phone: [phone] Fax:  [place], [place] - [address] [place] [address] Phone: [phone] Fax: [phone]  Readmission Risk Interventions No flowsheet data found.

## 2021-01-30 NOTE — Evaluation (Signed)
Occupational Therapy Evaluation Patient Details Name: Nancy Brooks MRN: [id number] DOB: 08-Dec-1950 Today's Date: 01/30/2021   History of Present Illness Nancy Brooks is a 70 y.o. female with PMH significant for schizophrenia, anxiety/depression, osteoporosis, esophageal dyskinesis and history of Lisfranc injury of left foot.  On 12/11/2020, patient underwent open reduction of Lisfranc fracture on the left with reduction of dislocation and percutaneous pinning.  On 9/23, patient underwent left foot tarsometatarsal arthrodesis for metatarsal fracture. Patient NWB   Clinical Impression   Ms. Nancy Brooks is a 70 year old woman who presents NWB after left foot surgery with generalized weakness and decreased activity tolerance and impaired balance. She is alert to self and situation and has a tendency to repeat herself and perseverate on what she is not allowed to do. Patient able to answer simple questions but deflects a lot to asking her sister. Appears to have some baseline cognitive impairment or intellectual disability but able to follow and understand simple instructions and concepts. Patient able to perform bed mobility but min assist x 2 for standing and pivoting with one person making the left foot remains NWB. She is unable to maintain standing position for long despite use of UEs on walker - with about 3 hops fatiguing her and needing to sit immediately. She is fearful of falling. She needs assistance for LB ADLs and set up for UB ADLs. Patient will benefit from skilled OT services while in hospital to improve deficits and learn compensatory strategies as needed in order to improve functional abilities and reduce caregiver burden. She exhibits good rehab potential. Would benefit from consistent and structured therapy.     Recommendations for follow up therapy are one component of a multi-disciplinary discharge planning process, led by the attending physician.  Recommendations may be updated  based on patient status, additional functional criteria and insurance authorization.   Follow Up Recommendations  SNF    Equipment Recommendations  None recommended by OT    Recommendations for Other Services       Precautions / Restrictions Precautions Precautions: Fall Restrictions Weight Bearing Restrictions: Yes LLE Weight Bearing: Non weight bearing      Mobility Bed Mobility Overal bed mobility: Modified Independent                  Transfers Overall transfer level: Needs assistance Equipment used: Rolling walker (2 wheeled) Transfers: Sit to/from [employer] Transfers Sit to Stand: Min assist;+2 safety/equipment Stand pivot transfers: Min assist;+2 safety/equipment            Balance Overall balance assessment: Needs assistance Sitting-balance support: No upper extremity supported Sitting balance-Leahy Scale: Good     Standing balance support: Bilateral upper extremity supported Standing balance-Leahy Scale: Poor                             ADL either performed or assessed with clinical judgement   ADL Overall ADL's : Needs assistance/impaired Eating/Feeding: Independent   Grooming: Set up;Sitting   Upper Body Bathing: Set up;Sitting   Lower Body Bathing: Moderate assistance;Sitting/lateral leans;Bed level   Upper Body Dressing : Set up;Sitting;Bed level   Lower Body Dressing: Maximal assistance;Bed level   Toilet Transfer: Minimal assistance;+2 for safety/equipment;BSC;Stand-pivot;RW Toilet Transfer Details (indicate cue type and reason): able to pivot with min assist and walker. One person to steady patient and instruct the other to make sure foot doesn't touch the ground. Toileting- Clothing Manipulation and Hygiene: Maximal assistance;Sitting/lateral  lean Toileting - Clothing Manipulation Details (indicate cue type and reason): able to wipe herself but unable to manage clothing due to needing hands on walker.              Vision Patient Visual Report: No change from baseline       Perception     Praxis      Pertinent Vitals/Pain Pain Assessment: Faces Faces Pain Scale: Hurts little more Pain Location: L foot Pain Descriptors / Indicators: Aching Pain Intervention(s): Monitored during session;Premedicated before session     Hand Dominance Right   Extremity/Trunk Assessment Upper Extremity Assessment Upper Extremity Assessment: Overall WFL for tasks assessed (Overall 4/5 strength with MMT. Though endurance is poor and unable to resist for longer than a couple of seconds.)   Lower Extremity Assessment Lower Extremity Assessment: Defer to PT evaluation   Cervical / Trunk Assessment Cervical / Trunk Assessment: Normal   Communication Communication Communication: Expressive difficulties (some difficuly with expressing herself. Repeats herself with some word finding difficulty.)   Cognition Arousal/Alertness: Awake/alert Behavior During Therapy: WFL for tasks assessed/performed Overall Cognitive Status: Within Functional Limits for tasks assessed                                     General Comments       Exercises     Shoulder Instructions      Home Living Family/patient expects to be discharged to:: Skilled nursing facility                                        Prior Functioning/Environment Level of Independence: Needs assistance  Gait / Transfers Assistance Needed: Has been at facility - limited mobility due to NWB ADL's / Homemaking Assistance Needed: assistance for ADLs. Mostly at bed level            OT Problem List: Decreased strength;Decreased activity tolerance;Impaired balance (sitting and/or standing);Decreased cognition;Decreased safety awareness;Pain;Decreased knowledge of precautions;Decreased knowledge of use of DME or AE      OT Treatment/Interventions: Self-care/ADL training;Therapeutic exercise;DME and/or AE  instruction;Therapeutic activities;Balance training;Patient/family education    OT Goals(Current goals can be found in the care plan section) Acute Rehab OT Goals OT Goal Formulation: Patient unable to participate in goal setting Time For Goal Achievement: 02/13/21 Potential to Achieve Goals: Good ADL Goals Pt Will Perform Lower Body Dressing: with min assist;sitting/lateral leans;bed level Pt Will Transfer to Toilet: with min guard assist;bedside commode;regular height toilet;stand pivot transfer Pt Will Perform Toileting - Clothing Manipulation and hygiene: with min guard assist;sitting/lateral leans Additional ADL Goal #1: Patient will stand x 1 min with RW as avidence of improving strength and endurance - and as needed for ADLs.  OT Frequency: Min 2X/week   Barriers to D/C:            Co-evaluation              AM-PAC OT "6 Clicks" Daily Activity     Outcome Measure Help from another person eating meals?: None Help from another person taking care of personal grooming?: None Help from another person toileting, which includes using toliet, bedpan, or urinal?: A Lot Help from another person bathing (including washing, rinsing, drying)?: A Lot Help from another person to put on and taking off regular upper body clothing?: A Little Help from  another person to put on and taking off regular lower body clothing?: A Lot 6 Click Score: 17   End of Session Equipment Utilized During Treatment: Rolling walker;Gait belt Nurse Communication: Mobility status  Activity Tolerance: Patient tolerated treatment well Patient left: with call bell/phone within reach;in chair;with chair alarm set  OT Visit Diagnosis: Other abnormalities of gait and mobility (R26.89);Pain;Other symptoms and signs involving cognitive function                Time: 9038-3338 OT Time Calculation (min): 24 min Charges:  OT General Charges $OT Visit: 1 Visit OT Evaluation $OT Eval Low Complexity: 1 Low OT  Treatments $Self Care/Home Management : 8-22 mins  Lynne Takemoto, OTR/L [place]  Office [phone] Pager: [place] 01/30/2021, 11:37 AM

## 2021-01-30 NOTE — Progress Notes (Signed)
Subjective: POD #1 s/p left foot Lisfranc arthrodesis.  She is doing well this morning.  No fevers or chills.  She does get some "Moderate" pain but seems to be controlled this morning. Denies any systemic complaints such as fevers, chills, nausea, vomiting.   Objective: NAD Dressing clean, dry, intact without any strikethrough.  Immediate cap refill time throughout the digits.  Motor function intact.  No pain with calf compression, erythema or warmth.  Assessment: POD #1 s/p left foot Lisfranc arthrodesis  Plan: Discussed to continue ice and elevation.  Pain medication as needed.  She is nonweightbearing.  Keep dressings intact.  Will need to be discharged to rehab. Podiatry will continue to follow  Celesta Gentile, DPM

## 2021-01-30 NOTE — Progress Notes (Signed)
PROGRESS NOTE  [name]  DOB: [birth year]  PCP: Hoyt Koch, MD ONG:[id number]  DOA: 01/29/2021  LOS: 1 day  Hospital Day: 2   Chief complaint: Leg pain  Brief narrative: Nancy Brooks is a 70 y.o. female with PMH significant for schizophrenia, anxiety/depression, osteoporosis, esophageal dyskinesis and history of Lisfranc injury of left foot. On 12/11/2020, patient underwent open reduction of Lisfranc fracture on the left with reduction of dislocation and percutaneous pinning. On 9/23, patient underwent left foot tarsometatarsal arthrodesis for metatarsal fracture. Postoperative, admitted to hospital service for medical management See below for details  Subjective: Patient was seen and examined this morning.  Pleasant elderly Caucasian female.  With baseline psychiatric and cognitive issues.  Alert, awake, not in pain.  Mumbling.  Answers few questions.  Family not at bedside. Chart reviewed Heart rate in 90s, blood pressures running low in 80s and 90s this morning.  Breathing on room air. Labs this morning with WBC count up at 12.2, hemoglobin 10.9, BMP unremarkable  Assessment/Plan: Left foot Lisfranc injury -First surgery on 8/5, second on 9/23. -Postop pain control, PT OT consult -Per podiatry, strict nonweightbearing on left leg.  Keep left leg elevated -DVT prophylaxis with aspirin 325 mg twice daily  History of psychiatric disorder including schizophrenia, anxiety, depression -Continue psych meds which includes Librium, clozapine.  Chronic constipation -Continue senna, Linzess, MiraLAX.  Impaired mobility -Pending PT OT eval -Patient was previously at [facility] and prefers to go there if SNF is recommended.   Mobility: PT eval pending Code Status:   Code Status: Full Code  Nutritional status: Body mass index is 23.44 kg/m.     Diet:  Diet Order             Diet regular Room service appropriate? Yes; Fluid consistency: Thin  Diet  effective ____                  DVT prophylaxis:  enoxaparin (LOVENOX) injection 40 mg Start: 01/30/21 1000   Antimicrobials: Per podiatry Fluid: None Consultants: Podiatry Family Communication: Family not at bedside  Status is: Inpatient  Remains inpatient appropriate because: Pending placement after surgery  Dispo: The patient is from: Home              Anticipated d/c is to: SNF              Patient currently is medically stable to d/c.   Difficult to place patient No     Infusions:   sodium chloride      ceFAZolin (ANCEF) IV 1 g (01/30/21 0554)   lactated ringers 10 mL/hr at 01/29/21 1625    Scheduled Meds:  aspirin  325 mg Oral BID   chlordiazePOXIDE  10 mg Oral BID   cloZAPine  100 mg Oral Daily   cloZAPine  400 mg Oral QHS   enoxaparin (LOVENOX) injection  40 mg Subcutaneous Q24H   famotidine  10 mg Oral Daily   fentaNYL (SUBLIMAZE) injection  50 mcg Intravenous UD   linaclotide  72 mcg Oral Daily   midazolam  1-2 mg Intravenous UD   polyethylene glycol  17 g Oral Daily   senna  1 tablet Oral QHS   simvastatin  40 mg Oral q1800   sodium chloride flush  3 mL Intravenous Q12H    Antimicrobials: Anti-infectives (From admission, onward)    Start     Dose/Rate Route Frequency Ordered Stop   01/29/21 2200  ceFAZolin (ANCEF) IVPB 1 g/50 mL  premix        1 g 100 mL/hr over 30 Minutes Intravenous Every 8 hours 01/29/21 1918 01/30/21 2159   01/29/21 1400  ceFAZolin (ANCEF) IVPB 2g/100 mL premix        2 g 200 mL/hr over 30 Minutes Intravenous On call to O.R. 01/29/21 1348 01/29/21 1636       PRN meds: sodium chloride, ibuprofen, oxyCODONE, sodium chloride flush   Objective: Vitals:   01/30/21 0231 01/30/21 0512  BP: (!) 97/54 (!) 88/46  Pulse: 91 92  Resp: 17 15  Temp: 98.9 F (37.2 C) 97.7 F (36.5 C)  SpO2: 94% 95%    Intake/Output Summary (Last 24 hours) at 01/30/2021 1011 Last data filed at 01/29/2021 2200 Gross per 24 hour  Intake 2230  ml  Output 110 ml  Net 2120 ml   Filed Weights   01/28/21 1308 01/29/21 1411  Weight: 54.4 kg 54.4 kg   Weight change: 0 kg Body mass index is 23.44 kg/m.   Physical Exam: General exam: Pleasant, elderly Caucasian female.  Not in physical distress Skin: No rashes, lesions or ulcers. HEENT: Atraumatic, normocephalic, no obvious bleeding Lungs: Clear to auscultation bilaterally CVS: Regular rate and rhythm, no murmur GI/Abd soft, nontender, nondistended, bowel present CNS: Alert, awake, mumbling, able to answer few questions. Psychiatry: Depressed look Extremities: No pedal edema, no calf tenderness.  Left foot on immobilizer.  Data Review: I have personally reviewed the laboratory data and studies available.  Recent Labs  Lab 01/29/21 2124 01/30/21 0411  WBC 7.6 12.2*  HGB 12.5 10.9*  HCT 39.0 33.7*  MCV 90.3 89.4  PLT 248 244   Recent Labs  Lab 01/29/21 2124 01/30/21 0411  NA  --  138  K  --  4.0  CL  --  103  CO2  --  27  GLUCOSE  --  142*  BUN  --  17  CREATININE 0.81 0.78  CALCIUM  --  8.8*    F/u labs ordered. Unresulted Labs (From admission, onward)     Start     Ordered   02/05/21 0500  Creatinine, serum  (enoxaparin (LOVENOX)    CrCl >/= 30 ml/min)  Weekly,   R     Comments: while on enoxaparin therapy    01/29/21 2023            Signed, Terrilee Croak, MD [facility] 01/30/2021

## 2021-01-30 NOTE — Evaluation (Signed)
Physical Therapy Evaluation Patient Details Name: Nancy Brooks MRN: [id number] DOB: 1950/12/24 Today's Date: 01/30/2021  History of Present Illness  Nancy Brooks is a 70 y.o. female with PMH significant for schizophrenia, anxiety/depression, osteoporosis, esophageal dyskinesis and history of Lisfranc injury of left foot.  On 12/11/2020, patient underwent open reduction of Lisfranc fracture on the left with reduction of dislocation and percutaneous pinning.  On 9/23, patient underwent left foot tarsometatarsal arthrodesis for metatarsal fracture. Patient NWB  Clinical Impression  Pt admitted with above diagnosis. Pt ambulated 8' with RW, with good adherence to NWB status LLE. Assisted pt with BLE strengthening exercises, which she tolerated well. Pt denied pain throughout PT session.  Pt currently with functional limitations due to the deficits listed below (see PT Problem List). Pt will benefit from skilled PT to increase their independence and safety with mobility to allow discharge to the venue listed below.          Recommendations for follow up therapy are one component of a multi-disciplinary discharge planning process, led by the attending physician.  Recommendations may be updated based on patient status, additional functional criteria and insurance authorization.  Follow Up Recommendations SNF;Supervision for mobility/OOB    Equipment Recommendations  None recommended by PT    Recommendations for Other Services       Precautions / Restrictions Precautions Precautions: Fall Restrictions Weight Bearing Restrictions: No LLE Weight Bearing: Weight bearing as tolerated      Mobility  Bed Mobility Overal bed mobility: Modified Independent                  Transfers Overall transfer level: Needs assistance Equipment used: Rolling walker (2 wheeled) Transfers: Sit to/from Stand Sit to Stand: Min assist Stand pivot transfers: Min assist;+2 safety/equipment        General transfer comment: VCs hand placement, min A to power up  Ambulation/Gait Ambulation/Gait assistance: Min guard Gait Distance (Feet): 8 Feet Assistive device: Rolling walker (2 wheeled) Gait Pattern/deviations: Step-to pattern Gait velocity: decr   General Gait Details: good adherence to NWB status LLE  Stairs            Wheelchair Mobility    Modified Rankin (Stroke Patients Only)       Balance Overall balance assessment: Needs assistance Sitting-balance support: No upper extremity supported Sitting balance-Leahy Scale: Good     Standing balance support: Bilateral upper extremity supported Standing balance-Leahy Scale: Poor                               Pertinent Vitals/Pain Pain Assessment: Faces Faces Pain Scale: No hurt (pt denied pain with activity) Pain Location: L foot Pain Descriptors / Indicators: Aching Pain Intervention(s): Monitored during session;Premedicated before session    Home Living Family/patient expects to be discharged to:: Skilled nursing facility                      Prior Function Level of Independence: Needs assistance   Gait / Transfers Assistance Needed: Has been at facility - limited mobility due to NWB, pt doesn't recall if she'd been ambulating at SNF  ADL's / Homemaking Assistance Needed: assistance for ADLs. Mostly at bed level        Hand Dominance   Dominant Hand: Right    Extremity/Trunk Assessment   Upper Extremity Assessment Upper Extremity Assessment: Overall WFL for tasks assessed    Lower Extremity Assessment Lower Extremity  Assessment: LLE deficits/detail LLE Deficits / Details: knee ext +4/5, SLR -3/5, ankle in boot LLE: Unable to fully assess due to immobilization LLE Coordination: WNL    Cervical / Trunk Assessment Cervical / Trunk Assessment: Normal  Communication   Communication: Expressive difficulties (some difficuly with expressing herself. Repeats herself with  some word finding difficulty.)  Cognition Arousal/Alertness: Awake/alert Behavior During Therapy: WFL for tasks assessed/performed Overall Cognitive Status: No family/caregiver present to determine baseline cognitive functioning                                 General Comments: explained to pt how to use room phone, she had difficulty remembering instructions, I wrote them down for her and she still required verbal reinforcement of steps to make a call      General Comments      Exercises General Exercises - Lower Extremity Quad Sets: AROM;Both;5 reps;Supine Long Arc Quad: AROM;Both;10 reps;Seated Straight Leg Raises: AAROM;Left;10 reps;Supine Hip Flexion/Marching: AROM;Both;10 reps;Seated   Assessment/Plan    PT Assessment Patient needs continued PT services  PT Problem List Decreased activity tolerance;Decreased strength;Decreased mobility;Decreased balance       PT Treatment Interventions Functional mobility training;Therapeutic exercise;Balance training;Gait training;Patient/family education;Therapeutic activities    PT Goals (Current goals can be found in the Care Plan section)  Acute Rehab PT Goals Patient Stated Goal: walk farther PT Goal Formulation: With patient Time For Goal Achievement: 02/13/21 Potential to Achieve Goals: Good    Frequency Min 3X/week   Barriers to discharge        Co-evaluation               AM-PAC PT "6 Clicks" Mobility  Outcome Measure Help needed turning from your back to your side while in a flat bed without using bedrails?: A Little Help needed moving from lying on your back to sitting on the side of a flat bed without using bedrails?: A Little Help needed moving to and from a bed to a chair (including a wheelchair)?: A Little Help needed standing up from a chair using your arms (e.g., wheelchair or bedside chair)?: A Little Help needed to walk in hospital room?: A Little Help needed climbing 3-5 steps with a  railing? : A Lot 6 Click Score: 17    End of Session Equipment Utilized During Treatment: Gait belt Activity Tolerance: Patient tolerated treatment well Patient left: in chair;with call bell/phone within reach;with chair alarm set Nurse Communication: Mobility status PT Visit Diagnosis: Difficulty in walking, not elsewhere classified (R26.2);Muscle weakness (generalized) (M62.81)    Time: 3419-3790 PT Time Calculation (min) (ACUTE ONLY): 15 min   Charges:   PT Evaluation $PT Eval Moderate Complexity: 1 Mod         Philomena Doheny PT 01/30/2021  Acute Rehabilitation Services Pager [phone] Office [phone]

## 2021-01-31 DIAGNOSIS — S92302A Fracture of unspecified metatarsal bone(s), left foot, initial encounter for closed fracture: Secondary | ICD-10-CM | POA: Diagnosis not present

## 2021-01-31 DIAGNOSIS — S93325D Dislocation of tarsometatarsal joint of left foot, subsequent encounter: Secondary | ICD-10-CM

## 2021-01-31 MED ORDER — ADULT MULTIVITAMIN W/MINERALS CH
1.0000 | ORAL_TABLET | Freq: Every day | ORAL | Status: DC
  Administered 2021-01-31 – 2021-02-06 (×7): 1 via ORAL
  Filled 2021-01-31 (×7): qty 1

## 2021-01-31 MED ORDER — SODIUM CHLORIDE 0.9 % IV SOLN: INTRAVENOUS | Status: DC

## 2021-01-31 MED ORDER — SODIUM CHLORIDE 0.9 % IV BOLUS
1000.0000 mL | Freq: Once | INTRAVENOUS | Status: AC
  Administered 2021-02-01: 1000 mL via INTRAVENOUS

## 2021-01-31 MED ORDER — ENSURE ENLIVE PO LIQD
237.0000 mL | Freq: Two times a day (BID) | ORAL | Status: DC
  Administered 2021-02-02 – 2021-02-06 (×6): 237 mL via ORAL

## 2021-01-31 MED ORDER — PANTOPRAZOLE SODIUM 40 MG PO TBEC
40.0000 mg | DELAYED_RELEASE_TABLET | Freq: Every day | ORAL | Status: DC
  Administered 2021-01-31 – 2021-02-06 (×7): 40 mg via ORAL
  Filled 2021-01-31 (×7): qty 1

## 2021-01-31 NOTE — Op Note (Signed)
Patient Name: Nancy Brooks DOB: 1951/05/01  MRN: [id number]   Date of Service: 01/29/2021  Surgeon: Dr. Lanae Crumbly, DPM Assistants: None Pre-operative Diagnosis:  1) Lisfranc fractures left foot 2) post traumatic arthritis left foot Post-operative Diagnosis:  1) Lisfranc fractures left foot 2) post traumatic arthritis left foot 3) major osseous defects Procedures:  1) Arthrodesis midtarsal, multiple left foot  2) bone graft minor from left calcaneus to fusion site Pathology/Specimens: * No specimens in log * Anesthesia: General and regional Hemostasis:  Total Tourniquet Time Documented: Calf (Left) - 115 minutes Total: Calf (Left) - 115 minutes  Estimated Blood Loss: 10 mL Materials:  Implant Name Type Inv. Item Serial No. Manufacturer Lot No. LRB No. Used Action  PUTTY DBM STAGRAFT PLUS 10CC - [id number] Putty PUTTY DBM STAGRAFT PLUS 10CC  [employer](ORTH,TRAU,BIO,SG) [id number] Left 1 Implanted  EASYFUSE DYNAMIC COMPRESSION SYSTEM 2 LEG STAPLE 20MMX20MM    [employer] [id number] Left 1 Implanted  EASYFUSE DYNAMIC COMPRESSION SYSTEM 2 LEG STAPLE 25MMX20MM    [employer] [id number] Left 1 Implanted  EASYFUSE DYNAMIC COMPRESSION SYSTEM 2 LEG STAPLE 20MMX15MM    [employer] [id number] Left 1 Implanted  5 HOLE STRAIGHT PLATE    [employer]  Left 1 Implanted  3.5 X 18 LOCK SCREW    [employer]  Left 1 Implanted  3.5 X 24 LOCK SCREW    [employer]  Left 2 Implanted  SCREW LOCK THRD T10 3.5XL22 - [id number] Screw SCREW LOCK THRD T10 3.5XL22  [employer]  Left 1 Implanted   Medications: None Complications: None  Indications for Procedure:  This is a 70 y.o. female with a history of prior Lisfranc fracture dislocation and she previously underwent open reduction of this with percutaneous pinning due to her soft tissue swelling and trauma that prevented the definitive procedure from being performed at the initial time of injury.  She  returns today for the second stage of her procedure for arthrodesis of the tarsometatarsal joints 1 2 and 3.  Informed consent was signed prior to surgery all questions were addressed for the patient and her family.  Procedure in Detail: Patient was identified in pre-operative holding area. Formal consent was signed and the left lower extremity was marked. Patient was brought back to the operating room. Anesthesia was induced.  Left foot was scrubbed with a Betadine scrub brush and then the Steinmann pins were removed.  A curette was used to curette the pin sites of all crust.  The extremity was prepped and draped in the usual sterile fashion. Timeout was taken to confirm patient name, laterality, and procedure prior to incision.   Attention was then directed to the left foot where a linear incision was made over the first tarsometatarsal joint just medial to the extensor hallucis longus tendon.  Dissection was carried down to the level of the joint and the collateral ligaments were released.  An osteotome was used to free the plantar soft tissue attachments of the joint.  The joint was then resected using a sagittal saw osteotome and rongeur.  Cancellous bleeding bone was present.  Attention was then directed to the second and third tarsometatarsal joints via a linear incision between these 2 joints.  The extensor tendons were divided and the neurovascular bundles were protected throughout this portion of the procedure.  Second and third tarsometatarsal joints were identified and resected using sagittal saw osteotome and rongeur.  Once adequate resection of the articular surface was completed bleeding cancellous bone was present.  All fusion sites were then fenestrated with a 2 mm drill.  I then directed attention to the left heel where a linear incision was used to expose the lateral calcaneal wall.  An Acumed bone graft harvester was then used to obtain approximately 3 cc of cancellous bone graft.  This was  mixed with approximately 3 cc of demineralized bone matrix putty.  This was then placed into the arthrodesis site and manually compressed.  A reduction clamp was used to maintain reduction of the previous dislocation.  K wires were then used to temporarily fixate the arthrodesis site.  Stryker EZ fuse staples were then used to fixate and compress the arthrodesis site of the first second and third tarsometatarsal joints.  The first metatarsal was additionally fixated with a 5 hole locking plate with locking screws.  Fluoroscopy was used throughout the procedure for guidance and final films were taken once correction was adequate.  All wounds were then thoroughly irrigated with normal sterile saline and closed with 3-0 Monocryl and 3-0 nylon.  The foot was then dressed with Xeroform and dry sterile dressings and an Ace wrap. Patient tolerated the procedure well.   Disposition: Following a period of post-operative monitoring, patient will be transferred to the floor.  She will be nonweightbearing to the left lower extremity.  She would likely require inpatient rehab due to her mental health issues as well as inability to stay off her foot.[name]

## 2021-01-31 NOTE — Anesthesia Postprocedure Evaluation (Signed)
Anesthesia Post Note  Patient: Nancy Brooks  Procedure(s) Performed: ARTHRODESIS TARSOMETATARSAL LEFT FOOT (Left: Foot)     Patient location during evaluation: PACU Anesthesia Type: General and Regional Level of consciousness: awake and alert Pain management: pain level controlled Vital Signs Assessment: post-procedure vital signs reviewed and stable Respiratory status: spontaneous breathing, nonlabored ventilation, respiratory function stable and patient connected to nasal cannula oxygen Cardiovascular status: blood pressure returned to baseline and stable Postop Assessment: no apparent nausea or vomiting Anesthetic complications: no   No notable events documented.  Last Vitals:  Vitals:   01/31/21 0051 01/31/21 0531  BP: (!) 103/58 104/62  Pulse: 96 94  Resp: 16 17  Temp: 37 C 36.7 C  SpO2: 99% 96%    Last Pain:  Vitals:   01/31/21 0051  TempSrc: Oral  PainSc:                  [place]

## 2021-01-31 NOTE — Progress Notes (Signed)
Subjective: POD #2 s/p left foot Lisfranc arthrodesis. She states that she is having some soreness. No fevers, chills. She has no concerns this morning.   Objective: NAD CAM boot intact Dressing clean, dry, intact without any strikethrough.  Immediate cap refill time throughout the digits.  Motor function intact.  No pain with calf compression, erythema or warmth.  Assessment: POD #2 s/p left foot Lisfranc arthrodesis  Plan: Discussed to continue ice and elevation.  Keep the dressings clean, dry, intact.  Pain medication as needed.  Continue nonweightbearing.Will need to be discharged to rehab. Podiatry will continue to follow.   Attempted to call patient's sister,Janet, for an update. No answer unable to leave voicemail as it was full.  Celesta Gentile, DPM

## 2021-01-31 NOTE — Progress Notes (Signed)
Initial Nutrition Assessment  DOCUMENTATION CODES:   Not applicable  INTERVENTION:   - Please obtain measured admission weight  - Ensure Enlive po BID, each supplement provides 350 kcal and 20 grams of protein  - MVI with minerals daily  - Encourage PO intake  NUTRITION DIAGNOSIS:   Increased nutrient needs related to post-op healing as evidenced by estimated needs.  GOAL:   Patient will meet greater than or equal to 90% of their needs  MONITOR:   PO intake, Supplement acceptance, Labs, Weight trends, Skin  REASON FOR ASSESSMENT:   Malnutrition Screening Tool    ASSESSMENT:   70 year old female who presented on 9/23 with lisfranc fracture to L foot. Pt had first stage of repair last month and discharged to SNF. Presented for second stage of repair with podiatry. PMH of schizophrenia, HLD, anxiety, depression, osteoporosis, esophageal dyskinesis.  9/23 - s/p arthrodesis tarsometatarsal L foot  RD working remotely. Attempted to reach pt via phone call to room; however, no answer. Unable to obtain diet and weight history at this time.  Reviewed available weight history in chart. Weight on admission of 120 lbs (54.4 kg) appears stated rather than measured but is consistent with weights from encounters on 11/12/20 (55.6 kg) and 12/17/20 (54.5 kg). Weight appears to be trending up overall since November 2020 (51.7 kg).  Given pt with variable PO intake on regular diet, will order oral nutrition supplements to aid pt in meeting kcal and protein needs. Will also order daily MVI with minerals.  Meal Completion: 0-75%  Medications reviewed and include: pepcid, protonix, miralax, senna  Labs reviewed. CBG's: 117   I/O's: +3.1 L since admit  NUTRITION - FOCUSED PHYSICAL EXAM:  Unable to complete at this time. RD working remotely.  Diet Order:   Diet Order             Diet regular Room service appropriate? No; Fluid consistency: Thin  Diet effective ____                    EDUCATION NEEDS:   No education needs have been identified at this time  Skin:  Skin Assessment: Reviewed RN Assessment (closed incision L foot)  Last BM:  01/31/21 medium type 4  Height:   Ht Readings from Last 1 Encounters:  01/29/21 5' (1.524 m)    Weight:   Wt Readings from Last 1 Encounters:  01/29/21 54.4 kg    BMI:  Body mass index is 23.44 kg/m.  Estimated Nutritional Needs:   Kcal:  1500-1700  Protein:  70-85 grams  Fluid:  1.5-1.7 L    Gustavus Bryant, MS, RD, LDN Inpatient Clinical Dietitian Please see AMiON for contact information.

## 2021-01-31 NOTE — Progress Notes (Signed)
PROGRESS NOTE  Nancy Brooks  DOB: Jan 30, 1951  PCP: Hoyt Koch, MD QIO:[id number]  DOA: 01/29/2021  LOS: 2 days  Hospital Day: 3   Chief complaint: Leg pain  Brief narrative: Nancy Brooks is a 70 y.o. female with PMH significant for schizophrenia, anxiety/depression, osteoporosis, esophageal dyskinesis and history of Lisfranc injury of left foot. On 12/11/2020, patient underwent open reduction of Lisfranc fracture on the left with reduction of dislocation and percutaneous pinning. On 9/23, patient underwent left foot tarsometatarsal arthrodesis for metatarsal fracture. Postoperative, admitted to hospital service for medical management See below for details  Subjective: Patient was seen and examined this morning. Lying on bed.  Not in distress.  Complains that she is not being addressed properly.  She has not been feeling well this morning. I related her concerns to nursing staff. Pain controlled.  Assessment/Plan: Left foot Lisfranc injury -First surgery on 8/5, second on 9/23. -Postop pain control, PT OT consult appreciated.  SNF recommended. -Per podiatry, strict nonweightbearing on left leg.  Keep left leg elevated -DVT prophylaxis with aspirin 325 mg twice daily per podiatry.  I will combine it with Protonix for GI prophylaxis.  Leukocytosis -Slight increase in WBC count today.  No evidence of infection.  Continue to monitor. Recent Labs  Lab 01/29/21 2124 01/30/21 0411  WBC 7.6 12.2*   History of psychiatric disorder including schizophrenia, anxiety, depression -Continue psych meds which includes Librium, clozapine.  Chronic constipation -Continue senna, Linzess, MiraLAX.  Impaired mobility -PT OT eval obtained. -Patient was previously at [facility] and prefers to go there if SNF is recommended.   Mobility: PT eval pending Code Status:   Code Status: Full Code  Nutritional status: Body mass index is 23.44 kg/m.     Diet:  Diet Order              Diet regular Room service appropriate? No; Fluid consistency: Thin  Diet effective ____                  DVT prophylaxis:    Aspirin 325 mg twice daily per podiatry.     Antimicrobials: Per podiatry Fluid: None Consultants: Podiatry Family Communication: Family not at bedside  Status is: Inpatient  Remains inpatient appropriate because: Pending placement after surgery  Dispo: The patient is from: Home              Anticipated d/c is to: SNF              Patient currently is medically stable to d/c.   Difficult to place patient No     Infusions:   sodium chloride     lactated ringers 10 mL/hr at 01/29/21 1625    Scheduled Meds:  aspirin  325 mg Oral BID   chlordiazePOXIDE  10 mg Oral BID   cloZAPine  100 mg Oral Daily   cloZAPine  400 mg Oral QHS   famotidine  10 mg Oral Daily   fentaNYL (SUBLIMAZE) injection  50 mcg Intravenous UD   linaclotide  72 mcg Oral Daily   midazolam  1-2 mg Intravenous UD   pantoprazole  40 mg Oral Daily   polyethylene glycol  17 g Oral Daily   senna  1 tablet Oral QHS   simvastatin  40 mg Oral q1800   sodium chloride flush  3 mL Intravenous Q12H    Antimicrobials: Anti-infectives (From admission, onward)    Start     Dose/Rate Route Frequency Ordered Stop   01/29/21  2200  ceFAZolin (ANCEF) IVPB 1 g/50 mL premix        1 g 100 mL/hr over 30 Minutes Intravenous Every 8 hours 01/29/21 1918 01/30/21 1448   01/29/21 1400  ceFAZolin (ANCEF) IVPB 2g/100 mL premix        2 g 200 mL/hr over 30 Minutes Intravenous On call to O.R. 01/29/21 1348 01/29/21 1636       PRN meds: sodium chloride, ibuprofen, oxyCODONE, sodium chloride flush   Objective: Vitals:   01/31/21 0051 01/31/21 0531  BP: (!) 103/58 104/62  Pulse: 96 94  Resp: 16 17  Temp: 98.6 F (37 C) 98 F (36.7 C)  SpO2: 99% 96%    Intake/Output Summary (Last 24 hours) at 01/31/2021 1046 Last data filed at 01/30/2021 1800 Gross per 24 hour  Intake 630 ml   Output --  Net 630 ml   Filed Weights   01/28/21 1308 01/29/21 1411  Weight: 54.4 kg 54.4 kg   Weight change:  Body mass index is 23.44 kg/m.   Physical Exam: General exam: Pleasant, elderly Caucasian female.  Thin built.  Not in physical distress.  No new symptoms Skin: No rashes, lesions or ulcers. HEENT: Atraumatic, normocephalic, no obvious bleeding Lungs: Clear to auscultation bilaterally CVS: Regular rate and rhythm, no murmur GI/Abd soft, nontender, nondistended, bowel present CNS: Alert, awake, mumbling, able to answer few questions. Psychiatry: Depressed look Extremities: No pedal edema, no calf tenderness.  Left foot on immobilizer.  Data Review: I have personally reviewed the laboratory data and studies available.  Recent Labs  Lab 01/29/21 2124 01/30/21 0411  WBC 7.6 12.2*  NEUTROABS  --  11.5*  HGB 12.5 10.9*  HCT 39.0 33.7*  MCV 90.3 89.4  PLT 248 244   Recent Labs  Lab 01/29/21 2124 01/30/21 0411  NA  --  138  K  --  4.0  CL  --  103  CO2  --  27  GLUCOSE  --  142*  BUN  --  17  CREATININE 0.81 0.78  CALCIUM  --  8.8*    F/u labs ordered. Unresulted Labs (From admission, onward)    None       Signed, Terrilee Croak, MD [facility] 01/31/2021

## 2021-02-01 ENCOUNTER — Encounter: Payer: Self-pay | Admitting: Gastroenterology

## 2021-02-01 DIAGNOSIS — S93325D Dislocation of tarsometatarsal joint of left foot, subsequent encounter: Secondary | ICD-10-CM | POA: Diagnosis not present

## 2021-02-01 NOTE — Care Management (Addendum)
RE: Nancy Brooks Date of Birth: 13-Nov-1950 Date: 02/01/2021 MUST ID: [id number]    To Whom It May Concern:   Please be advised that the above name patient will require a short-term nursing home stay--anticipated 30 days or less rehabilitation and strengthening. The plan is for return home.

## 2021-02-01 NOTE — NC FL2 (Signed)
[place] LEVEL OF CARE SCREENING TOOL     IDENTIFICATION  Patient Name: ARBIE REISZ Birthdate: 01/25/1951 Sex: female Admission Date (Current Location): 01/29/2021  [facility] and [place] Number:  Herbalist and Address:  [facility],  [address] [place], [place]      Provider Number: [id number]  Attending Physician Name and Address:  Terrilee Croak, MD  Relative Name and Phone Number:  [name] (sister) Ph: [phone]    Current Level of Care: Hospital Recommended Level of Care: [facility] Prior Approval Number:    Date Approved/Denied:   PASRR Number: Pending  Discharge Plan: SNF    Current Diagnoses: Patient Active Problem List   Diagnosis Date Noted   Closed displaced fracture of metatarsal bone of left foot    Lisfranc dislocation 01/29/2021   Chills    Constipation 12/11/2020   Foot fracture, left 12/10/2020   Dislocation of tarsometatarsal joint of left foot 11/30/2020   Incontinence of feces 03/12/2019   B12 deficiency 12/01/2017   Routine general medical examination at a health care facility 05/22/2015   Senile osteoporosis 10/05/2011   Hyperlipidemia 01/20/2009   GERD 01/20/2009   Schizophrenia, unspecified type ([place]) 07/06/2006    Orientation RESPIRATION BLADDER Height & Weight     Self, Situation, Place  Normal Incontinent Weight: 120 lb (54.4 kg) Height:  5' (152.4 cm)  BEHAVIORAL SYMPTOMS/MOOD NEUROLOGICAL BOWEL NUTRITION STATUS      Continent Diet (Regular diet)  AMBULATORY STATUS COMMUNICATION OF NEEDS Skin   Limited Assist Verbally Surgical wounds                       Personal Care Assistance Level of Assistance  Bathing, Feeding, Dressing Bathing Assistance: Limited assistance Feeding assistance: Independent Dressing Assistance: Maximum assistance     Functional Limitations Info  Sight, Hearing, Speech Sight Info: Adequate Hearing Info: Adequate Speech Info:  Adequate    SPECIAL CARE FACTORS FREQUENCY  PT (By licensed PT), OT (By licensed OT)     PT Frequency: 5x's/week OT Frequency: 5x's/week            Contractures Contractures Info: Not present    Additional Factors Info  Code Status, Allergies, Psychotropic Code Status Info: Full Allergies Info: NKA Psychotropic Info: Librium (chlordiazepoxide), Clozaril (clozapine)         Current Medications (02/01/2021):  This is the current hospital active medication list Current Facility-Administered Medications  Medication Dose Route Frequency Provider Last Rate Last Admin   0.9 %  sodium chloride infusion  250 mL Intravenous PRN Wouk, Ailene Rud, MD       0.9 %  sodium chloride infusion   Intravenous Continuous Athena Masse, MD 125 mL/hr at 02/01/21 0912 New Bag at 02/01/21 0912   aspirin tablet 325 mg  325 mg Oral BID [name], DPM   325 mg at 02/01/21 0858   chlordiazePOXIDE (LIBRIUM) capsule 10 mg  10 mg Oral BID Gwynne Edinger, MD   10 mg at 02/01/21 2542   cloZAPine (CLOZARIL) tablet 100 mg  100 mg Oral Daily Gwynne Edinger, MD   100 mg at 02/01/21 0859   cloZAPine (CLOZARIL) tablet 400 mg  400 mg Oral QHS Gwynne Edinger, MD   400 mg at 01/31/21 2051   famotidine (PEPCID) tablet 10 mg  10 mg Oral Daily Gwynne Edinger, MD   10 mg at 02/01/21 0859   feeding supplement (ENSURE  ENLIVE / ENSURE PLUS) liquid 237 mL  237 mL Oral BID BM Dahal, Binaya, MD       fentaNYL (SUBLIMAZE) injection 50 mcg  50 mcg Intravenous UD Wouk, Ailene Rud, MD   50 mcg at 01/29/21 1432   ibuprofen (ADVIL) tablet 600 mg  600 mg Oral Q6H PRN Wouk, Ailene Rud, MD       lactated ringers infusion   Intravenous Continuous Gwynne Edinger, MD 10 mL/hr at 01/29/21 1625 New Bag at 01/29/21 1707   linaclotide (LINZESS) capsule 72 mcg  72 mcg Oral Daily Gwynne Edinger, MD   72 mcg at 02/01/21 0858   midazolam (VERSED) injection 1-2 mg  1-2 mg Intravenous UD Wouk, Ailene Rud, MD        multivitamin with minerals tablet 1 tablet  1 tablet Oral Daily Dahal, Marlowe Aschoff, MD   1 tablet at 02/01/21 0859   oxyCODONE (Oxy IR/ROXICODONE) immediate release tablet 5 mg  5 mg Oral Q6H PRN Gwynne Edinger, MD   5 mg at 01/31/21 1252   pantoprazole (PROTONIX) EC tablet 40 mg  40 mg Oral Daily Dahal, Marlowe Aschoff, MD   40 mg at 02/01/21 0859   polyethylene glycol (MIRALAX / GLYCOLAX) packet 17 g  17 g Oral Daily Gwynne Edinger, MD   17 g at 02/01/21 0859   senna (SENOKOT) tablet 8.6 mg  1 tablet Oral QHS Gwynne Edinger, MD   8.6 mg at 01/31/21 2052   simvastatin (ZOCOR) tablet 40 mg  40 mg Oral q1800 Gwynne Edinger, MD   40 mg at 01/31/21 1747   sodium chloride flush (NS) 0.9 % injection 3 mL  3 mL Intravenous Q12H Gwynne Edinger, MD   3 mL at 01/31/21 2052   sodium chloride flush (NS) 0.9 % injection 3 mL  3 mL Intravenous PRN Wouk, Ailene Rud, MD         Discharge Medications: Please see discharge summary for a list of discharge medications.  Relevant Imaging Results:  Relevant Lab Results:   Additional Information SSN: [id number]  Sherie Don, LCSW

## 2021-02-01 NOTE — Progress Notes (Signed)
   02/01/21 0121  Assess: MEWS Score  Temp (!) 97.5 F (36.4 C)  BP 95/64  Pulse Rate (!) 101  Resp 18  SpO2 95 %  O2 Device Room Air  Assess: MEWS Score  MEWS Temp 0  MEWS Systolic 1  MEWS Pulse 1  MEWS RR 0  MEWS LOC 0  MEWS Score 2  MEWS Score Color Yellow  Assess: if the MEWS score is Yellow or Red  Were vital signs taken at a resting state? Yes  Focused Assessment No change from prior assessment  Does the patient meet 2 or more of the SIRS criteria? No  MEWS guidelines implemented *See Row Information* Yes  Treat  MEWS Interventions Administered prn meds/treatments  Take Vital Signs  Increase Vital Sign Frequency  Yellow: Q 2hr X 2 then Q 4hr X 2, if remains yellow, continue Q 4hrs  Notify: Charge Nurse/RN  Name of Charge Nurse/RN Notified Shepard Keltz RN  Date Charge Nurse/RN Notified 02/01/21  Time Charge Nurse/RN Notified 0121  Notify: Provider  Provider Name/Title Judd Gaudier  Date Provider Notified 01/31/21  Time Provider Notified 2338  Notification Type Page  Notification Reason Change in status  Provider response See new orders  Date of Provider Response 01/31/21  Time of Provider Response 2352  Document  Patient Outcome Stabilized after interventions  Progress note created (see row info) Yes  Assess: SIRS CRITERIA  SIRS Temperature  0  SIRS Pulse 1  SIRS Respirations  0  SIRS WBC 0  SIRS Score Sum  1

## 2021-02-01 NOTE — Progress Notes (Signed)
Physical Therapy Treatment Patient Details Name: Nancy Brooks MRN: [id number] DOB: 09/20/1950 Today's Date: 02/01/2021   History of Present Illness Nancy Brooks is a 70 y.o. female with PMH significant for schizophrenia, anxiety/depression, osteoporosis, esophageal dyskinesis and history of Lisfranc injury of left foot.  On 12/11/2020, patient underwent open reduction of Lisfranc fracture on the left with reduction of dislocation and percutaneous pinning.  On 9/23, patient underwent left foot tarsometatarsal arthrodesis for metatarsal fracture. Patient NWB    PT Comments    Pt with incr gait distance today however end of distance  pt requiring incr assist to maintain upright/RW proximity from self, NWB and  to prevent fall. Pt assisted back and descent to chair.  Recommendations for follow up therapy are one component of a multi-disciplinary discharge planning process, led by the attending physician.  Recommendations may be updated based on patient status, additional functional criteria and insurance authorization.  Follow Up Recommendations  SNF     Equipment Recommendations  None recommended by PT    Recommendations for Other Services       Precautions / Restrictions Precautions Precautions: Fall Required Braces or Orthoses: Other Brace Other Brace: camboot LLE Restrictions LLE Weight Bearing: Non weight bearing     Mobility  Bed Mobility Overal bed mobility: Needs Assistance Bed Mobility: Supine to Sit     Supine to sit: Supervision          Transfers Overall transfer level: Needs assistance Equipment used: Rolling walker (2 wheeled) Transfers: Sit to/from Stand Sit to Stand: Min assist         General transfer comment: VCs hand placement, min A to power up for sit to stand; pt requiring max assist to control descent and get close enough to chair to prevent fall  Ambulation/Gait Ambulation/Gait assistance: Min assist;Mod assist Gait Distance (Feet): 13  Feet Assistive device: Rolling walker (2 wheeled)       General Gait Details: pt adheres to NWB initially however began dragging LLE last 5 feet, requiring mod to max assist to maintain upright, maintain correct proximity to RW and NWB. multi-modal cues needed for NWB, RW position, trunk extension and use of UEs   Stairs             Wheelchair Mobility    Modified Rankin (Stroke Patients Only)       Balance   Sitting-balance support: No upper extremity supported Sitting balance-Leahy Scale: Good     Standing balance support: Bilateral upper extremity supported Standing balance-Leahy Scale: Poor                              Cognition Arousal/Alertness: Awake/alert Behavior During Therapy: WFL for tasks assessed/performed;Flat affect Overall Cognitive Status: No family/caregiver present to determine baseline cognitive functioning                                 General Comments: decr recall of useof phone and call bell; pt cannot remember if she has eaten lunch, asking for "recipe things" --pt referring to menu      Exercises      General Comments        Pertinent Vitals/Pain Pain Assessment: Faces Faces Pain Scale: Hurts little more Pain Location: L foot Pain Descriptors / Indicators: Aching Pain Intervention(s): Limited activity within patient's tolerance;Monitored during session;Repositioned;Patient requesting pain meds-RN notified    Home Living  Prior Function            PT Goals (current goals can now be found in the care plan section) Acute Rehab PT Goals Patient Stated Goal: walk more PT Goal Formulation: With patient Time For Goal Achievement: 02/13/21 Potential to Achieve Goals: Good Progress towards PT goals: Progressing toward goals    Frequency    Min 3X/week      PT Plan Current plan remains appropriate    Co-evaluation              AM-PAC PT "6 Clicks" Mobility    Outcome Measure  Help needed turning from your back to your side while in a flat bed without using bedrails?: A Little Help needed moving from lying on your back to sitting on the side of a flat bed without using bedrails?: A Little Help needed moving to and from a bed to a chair (including a wheelchair)?: A Little Help needed standing up from a chair using your arms (e.g., wheelchair or bedside chair)?: A Little Help needed to walk in hospital room?: A Lot Help needed climbing 3-5 steps with a railing? : Total 6 Click Score: 15    End of Session Equipment Utilized During Treatment: Gait belt Activity Tolerance: Patient tolerated treatment well;Patient limited by fatigue Patient left: in chair;with call bell/phone within reach;with chair alarm set Nurse Communication: Mobility status PT Visit Diagnosis: Difficulty in walking, not elsewhere classified (R26.2);Muscle weakness (generalized) (M62.81)     Time: 2446-2863 PT Time Calculation (min) (ACUTE ONLY): 14 min  Charges:  $Gait Training: 8-22 mins                     Baxter Flattery, PT  Acute Rehab Dept ([place]) [phone] Pager [phone]  02/01/2021    [facility] 02/01/2021, 12:51 PM

## 2021-02-01 NOTE — Progress Notes (Signed)
  Subjective:  Patient ID: Nancy Brooks, female    DOB: [birth year],  MRN: [id number]  She is doing well she is having very little pain she has been staying off the foot she says and keeping it iced and elevated  Negative for chest pain and shortness of breath Fever: no Night sweats: no Constitutional signs: no Review of all other systems is negative Objective:   Vitals:   02/01/21 0300 02/01/21 1356  BP: 96/67 (!) 91/54  Pulse: 92 100  Resp: 18 18  Temp: (!) 97.5 F (36.4 C) 97.7 F (36.5 C)  SpO2: 96% 100%   General AA&O x3. Normal mood and affect.  Vascular Dorsalis pedis and posterior tibial pulses 2/4 bilat. Brisk capillary refill to all digits. Pedal hair present.  Neurologic Epicritic sensation grossly intact.  Dermatologic Dressing clean dry and intact, I changed her dressing and inspected the incisions and they are healing well without signs of infection little edema no ecchymosis and skin bridge is well perfused  Orthopedic: MMT 5/5     Assessment & Plan:  Patient was evaluated and treated and all questions answered.  POD #3 midtarsal arthrodesis left foot with autograft from calcaneus -Doing very well.  She did have an elevated WBC today.  I recommend trending her WBC and I have another one ordered for tomorrow morning.  Suspect this is postoperative change, there is no signs of infection in the foot itself. -I spoke with her sister about her discharge planning and hopefully she will be able to go back to [facility] facility if they have space.  From my standpoint she is stable for discharge whenever this is possible -She has an appointment scheduled with me on Thursday, if she leaves the hospital tomorrow we will keep that appointment if she leaves Wednesday or later we will have her follow-up in 1 to 2 weeks since I saw her today and change her dressing -Continue strict NWB LLE -Ice behind the knee and elevation LLE.  Nursing doing an excellent job of maintaining  this.   [name], DPM  Accessible via secure chat for questions or concerns.

## 2021-02-01 NOTE — Progress Notes (Signed)
PROGRESS NOTE  Nancy Brooks  DOB: 10-16-1950  PCP: Hoyt Koch, MD VOJ:[id number]  DOA: 01/29/2021  LOS: 3 days  Hospital Day: 4   Chief complaint: Leg pain  Brief narrative: Nancy Brooks is a 70 y.o. female with PMH significant for schizophrenia, anxiety/depression, osteoporosis, esophageal dyskinesis and history of Lisfranc injury of left foot. On 12/11/2020, patient underwent open reduction of Lisfranc fracture on the left with reduction of dislocation and percutaneous pinning. On 9/23, patient underwent left foot tarsometatarsal arthrodesis for metatarsal fracture. Postoperative, admitted to hospital service for medical management See below for details  Subjective: Patient was seen and examined this afternoon.  Lying on bed.  Not in distress.  No new symptoms.  Assessment/Plan: Left foot Lisfranc injury -First surgery on 8/5, second on 9/23. -Postop pain control, PT OT consult appreciated.  SNF recommended. -Per podiatry, strict nonweightbearing on left leg.  Keep left leg elevated -DVT prophylaxis with aspirin 325 mg twice daily per podiatry.  Continue Protonix for GI prophylaxis.  Leukocytosis -Slight increase in WBC count today.  No evidence of infection.  Continue to monitor. Recent Labs  Lab 01/29/21 2124 01/30/21 0411  WBC 7.6 12.2*    History of psychiatric disorder including schizophrenia, anxiety, depression -Continue psych meds which includes Librium, clozapine.  Chronic constipation -Continue senna, Linzess, MiraLAX.  Impaired mobility -PT OT eval obtained. -Patient was previously at [facility] and prefers to go there if SNF is recommended.   Mobility: PT eval pending Code Status:   Code Status: Full Code  Nutritional status: Body mass index is 23.44 kg/m. Nutrition Problem: Increased nutrient needs Etiology: post-op healing Signs/Symptoms: estimated needs Diet:  Diet Order             Diet regular Room service appropriate?  No; Fluid consistency: Thin  Diet effective ____                  DVT prophylaxis:    Aspirin 325 mg twice daily per podiatry.     Antimicrobials: Per podiatry Fluid: None Consultants: Podiatry Family Communication: Family not at bedside  Status is: Inpatient  Remains inpatient appropriate because: Pending placement after surgery  Dispo: The patient is from: Home              Anticipated d/c is to: SNF              Patient currently is medically stable to d/c.   Difficult to place patient No     Infusions:   sodium chloride     sodium chloride 125 mL/hr at 02/01/21 0912   lactated ringers 10 mL/hr at 01/29/21 1625    Scheduled Meds:  aspirin  325 mg Oral BID   chlordiazePOXIDE  10 mg Oral BID   cloZAPine  100 mg Oral Daily   cloZAPine  400 mg Oral QHS   famotidine  10 mg Oral Daily   feeding supplement  237 mL Oral BID BM   fentaNYL (SUBLIMAZE) injection  50 mcg Intravenous UD   linaclotide  72 mcg Oral Daily   midazolam  1-2 mg Intravenous UD   multivitamin with minerals  1 tablet Oral Daily   pantoprazole  40 mg Oral Daily   polyethylene glycol  17 g Oral Daily   senna  1 tablet Oral QHS   simvastatin  40 mg Oral q1800   sodium chloride flush  3 mL Intravenous Q12H    Antimicrobials: Anti-infectives (From admission, onward)    Start  Dose/Rate Route Frequency Ordered Stop   01/29/21 2200  ceFAZolin (ANCEF) IVPB 1 g/50 mL premix        1 g 100 mL/hr over 30 Minutes Intravenous Every 8 hours 01/29/21 1918 01/30/21 1448   01/29/21 1400  ceFAZolin (ANCEF) IVPB 2g/100 mL premix        2 g 200 mL/hr over 30 Minutes Intravenous On call to O.R. 01/29/21 1348 01/29/21 1636       PRN meds: sodium chloride, ibuprofen, oxyCODONE, sodium chloride flush   Objective: Vitals:   02/01/21 0300 02/01/21 1356  BP: 96/67 (!) 91/54  Pulse: 92 (!) 103  Resp: 18 18  Temp: (!) 97.5 F (36.4 C) 97.7 F (36.5 C)  SpO2: 96% 100%    Intake/Output Summary (Last  24 hours) at 02/01/2021 1423 Last data filed at 02/01/2021 1000 Gross per 24 hour  Intake 1761.67 ml  Output 0 ml  Net 1761.67 ml    Filed Weights   01/28/21 1308 01/29/21 1411  Weight: 54.4 kg 54.4 kg   Weight change:  Body mass index is 23.44 kg/m.   Physical Exam: General exam: Pleasant, elderly Caucasian female.  Thin built.  Not in physical distress.  No new symptoms.   Skin: No rashes, lesions or ulcers. HEENT: Atraumatic, normocephalic, no obvious bleeding Lungs: Clear to auscultation bilaterally CVS: Regular rate and rhythm, no murmur GI/Abd soft, nontender, nondistended, bowel present CNS: Alert, awake, mumbling, able to answer few questions. Psychiatry: Depressed look Extremities: No pedal edema, no calf tenderness.  Left foot on immobilizer.  Data Review: I have personally reviewed the laboratory data and studies available.  Recent Labs  Lab 01/29/21 2124 01/30/21 0411  WBC 7.6 12.2*  NEUTROABS  --  11.5*  HGB 12.5 10.9*  HCT 39.0 33.7*  MCV 90.3 89.4  PLT 248 244    Recent Labs  Lab 01/29/21 2124 01/30/21 0411  NA  --  138  K  --  4.0  CL  --  103  CO2  --  27  GLUCOSE  --  142*  BUN  --  17  CREATININE 0.81 0.78  CALCIUM  --  8.8*     F/u labs ordered. Unresulted Labs (From admission, onward)     Start     Ordered   Pending  MRSA Next Gen by PCR, Nasal  Once,   R        Pending            Signed, Terrilee Croak, MD [facility] 02/01/2021

## 2021-02-01 NOTE — TOC Progression Note (Signed)
Transition of Care [facility]) - Progression Note   Patient Details  Name: KAITY PITSTICK MRN: [id number] Date of Birth: 10-06-1950  Transition of Care [facility]) CM/SW [place], LCSW Phone Number: 02/01/2021, 2:35 PM  Clinical Narrative: CSW spoke with patient's sister, [name], regarding PT recommendations. Per sister, she is aware the patient is now in copay days and was told at [facility] the patient would have to pay $80/day once she ran out of days. Sister is agreeable to paying for copay days as the patient needs rehab and is aware that a facility may require payment up front. Sister is agreeable to [facility] if the facility will accept the patient.  FL2 done; PASRR pending. CSW uploaded requested documents to [facility] MUST for PASRR. Initial referral faxed out. TOC awaiting bed offers.  Expected Discharge Plan: [place] Barriers to Discharge: Continued Medical Work up  Expected Discharge Plan and Services Expected Discharge Plan: [place] In-house Referral: Clinical Social Work Post Acute Care Choice: [place] Living arrangements for the past 2 months: Single Family Home  Readmission Risk Interventions No flowsheet data found.

## 2021-02-02 DIAGNOSIS — F32A Depression, unspecified: Secondary | ICD-10-CM | POA: Insufficient documentation

## 2021-02-02 DIAGNOSIS — S93325D Dislocation of tarsometatarsal joint of left foot, subsequent encounter: Secondary | ICD-10-CM | POA: Diagnosis not present

## 2021-02-02 DIAGNOSIS — F419 Anxiety disorder, unspecified: Secondary | ICD-10-CM | POA: Insufficient documentation

## 2021-02-02 LAB — CBC
HCT: 29.6 % — ABNORMAL LOW (ref 36.0–46.0)
Hemoglobin: 9.4 g/dL — ABNORMAL LOW (ref 12.0–15.0)
MCH: 28.8 pg (ref 26.0–34.0)
MCHC: 31.8 g/dL (ref 30.0–36.0)
MCV: 90.8 fL (ref 80.0–100.0)
Platelets: 195 10*3/uL (ref 150–400)
RBC: 3.26 MIL/uL — ABNORMAL LOW (ref 3.87–5.11)
RDW: 14.9 % (ref 11.5–15.5)
WBC: 6.9 10*3/uL (ref 4.0–10.5)
nRBC: 0 % (ref 0.0–0.2)

## 2021-02-02 NOTE — Progress Notes (Signed)
PROGRESS NOTE  Nancy Brooks  DOB: [birth year]  PCP: Hoyt Koch, MD NTI:[id number]  DOA: 01/29/2021  LOS: 4 days  Hospital Day: 5   Chief complaint: Leg pain  Brief narrative: Nancy Brooks is a 70 y.o. female with PMH significant for schizophrenia, anxiety/depression, osteoporosis, esophageal dyskinesis and history of Lisfranc injury of left foot. On 12/11/2020, patient underwent open reduction of Lisfranc fracture on the left with reduction of dislocation and percutaneous pinning. On 9/23, patient underwent left foot tarsometatarsal arthrodesis for metatarsal fracture. Postoperative, admitted to hospital service for medical management See below for details  Subjective: Patient was seen and examined this morning.  Lying on bed.  Not in distress.  No new symptoms.  Asking when she will have a bed available at rehab.  No family at bedside.  Assessment/Plan: Left foot Lisfranc injury -First surgery on 8/5, second on 9/23. -Postop pain control, PT OT consult appreciated.  SNF recommended. -Per podiatry, strict nonweightbearing on left leg.  Keep left leg elevated -DVT prophylaxis with aspirin 325 mg twice daily per podiatry.  Continue Protonix for GI prophylaxis.  Leukocytosis -WBC count improved today. Recent Labs  Lab 01/29/21 2124 01/30/21 0411 02/02/21 0458  WBC 7.6 12.2* 6.9    History of psychiatric disorder including schizophrenia, anxiety, depression -Continue psych meds which includes Librium, clozapine.  Chronic constipation -Continue senna, Linzess, MiraLAX.  Impaired mobility -PT OT eval obtained. -Patient was previously at [facility] and prefers to go there if SNF is recommended.   Mobility: PT eval appreciated. Code Status:   Code Status: Full Code  Nutritional status: Body mass index is 23.44 kg/m. Nutrition Problem: Increased nutrient needs Etiology: post-op healing Signs/Symptoms: estimated needs Diet:  Diet Order              Diet regular Room service appropriate? No; Fluid consistency: Thin  Diet effective ____                  DVT prophylaxis:    Aspirin 325 mg twice daily per podiatry.     Antimicrobials: Per podiatry Fluid: None Consultants: Podiatry Family Communication: Family not at bedside  Status is: Inpatient  Remains inpatient appropriate because: Pending placement  Dispo: The patient is from: Home              Anticipated d/c is to: SNF              Patient currently is medically stable to d/c.   Difficult to place patient No     Infusions:   sodium chloride     sodium chloride 125 mL/hr at 02/02/21 0044   lactated ringers 10 mL/hr at 01/29/21 1625    Scheduled Meds:  aspirin  325 mg Oral BID   chlordiazePOXIDE  10 mg Oral BID   cloZAPine  100 mg Oral Daily   cloZAPine  400 mg Oral QHS   famotidine  10 mg Oral Daily   feeding supplement  237 mL Oral BID BM   fentaNYL (SUBLIMAZE) injection  50 mcg Intravenous UD   linaclotide  72 mcg Oral Daily   midazolam  1-2 mg Intravenous UD   multivitamin with minerals  1 tablet Oral Daily   pantoprazole  40 mg Oral Daily   polyethylene glycol  17 g Oral Daily   senna  1 tablet Oral QHS   simvastatin  40 mg Oral q1800   sodium chloride flush  3 mL Intravenous Q12H    Antimicrobials: Anti-infectives (From admission,  onward)    Start     Dose/Rate Route Frequency Ordered Stop   01/29/21 2200  ceFAZolin (ANCEF) IVPB 1 g/50 mL premix        1 g 100 mL/hr over 30 Minutes Intravenous Every 8 hours 01/29/21 1918 01/30/21 1448   01/29/21 1400  ceFAZolin (ANCEF) IVPB 2g/100 mL premix        2 g 200 mL/hr over 30 Minutes Intravenous On call to O.R. 01/29/21 1348 01/29/21 1636       PRN meds: sodium chloride, ibuprofen, oxyCODONE, sodium chloride flush   Objective: Vitals:   02/02/21 0545 02/02/21 1334  BP: 118/61 (!) 112/57  Pulse: 92 (!) 102  Resp: 16 18  Temp: 98 F (36.7 C) 98.8 F (37.1 C)  SpO2: 100% 99%     Intake/Output Summary (Last 24 hours) at 02/02/2021 1411 Last data filed at 02/02/2021 1000 Gross per 24 hour  Intake 3250 ml  Output 0 ml  Net 3250 ml    Filed Weights   01/28/21 1308 01/29/21 1411  Weight: 54.4 kg 54.4 kg   Weight change:  Body mass index is 23.44 kg/m.   Physical Exam: General exam: Pleasant, elderly Caucasian female.  Thin built.  Not in physical distress.  No new symptoms.   Skin: No rashes, lesions or ulcers. HEENT: Atraumatic, normocephalic, no obvious bleeding Lungs: Clear to auscultation bilaterally CVS: Regular rate and rhythm, no murmur GI/Abd soft, nontender, nondistended, bowel present CNS: Alert, awake, mumbling, able to answer few questions.  Baseline cognitive issues. Psychiatry: Depressed look Extremities: No pedal edema, no calf tenderness.  Left foot on immobilizer.  Data Review: I have personally reviewed the laboratory data and studies available.  Recent Labs  Lab 01/29/21 2124 01/30/21 0411 02/02/21 0458  WBC 7.6 12.2* 6.9  NEUTROABS  --  11.5*  --   HGB 12.5 10.9* 9.4*  HCT 39.0 33.7* 29.6*  MCV 90.3 89.4 90.8  PLT 248 244 195    Recent Labs  Lab 01/29/21 2124 01/30/21 0411  NA  --  138  K  --  4.0  CL  --  103  CO2  --  27  GLUCOSE  --  142*  BUN  --  17  CREATININE 0.81 0.78  CALCIUM  --  8.8*     F/u labs ordered. Unresulted Labs (From admission, onward)    None       Signed, Terrilee Croak, MD [facility] 02/02/2021

## 2021-02-02 NOTE — NC FL2 (Signed)
[place] LEVEL OF CARE SCREENING TOOL     IDENTIFICATION  Patient Name: Nancy Brooks Birthdate: [birth year] Sex: female Admission Date (Current Location): 01/29/2021  [facility] and [place] Number:  Herbalist and Address:  [facility],  [place], [place]      Provider Number: [id number]  Attending Physician Name and Address:  Terrilee Croak, MD  Relative Name and Phone Number:  [name] (sister) Ph: [phone]    Current Level of Care: Hospital Recommended Level of Care: [place] Prior Approval Number:    Date Approved/Denied:   PASRR Number: Pending  Discharge Plan: SNF    Current Diagnoses: Patient Active Problem List   Diagnosis Date Noted   Depression 02/02/2021   Anxiety 02/02/2021   Closed displaced fracture of metatarsal bone of left foot    Lisfranc dislocation 01/29/2021   Chills    Constipation 12/11/2020   Foot fracture, left 12/10/2020   Dislocation of tarsometatarsal joint of left foot 11/30/2020   Incontinence of feces 03/12/2019   B12 deficiency 12/01/2017   Routine general medical examination at a health care facility 05/22/2015   Senile osteoporosis 10/05/2011   Hyperlipidemia 01/20/2009   GERD 01/20/2009   Schizophrenia, unspecified type ([place]) 07/06/2006    Orientation RESPIRATION BLADDER Height & Weight     Self, Situation, Place  Normal Incontinent Weight: 120 lb (54.4 kg) Height:  5' (152.4 cm)  BEHAVIORAL SYMPTOMS/MOOD NEUROLOGICAL BOWEL NUTRITION STATUS      Continent Diet (Regular diet)  AMBULATORY STATUS COMMUNICATION OF NEEDS Skin   Limited Assist Verbally Surgical wounds                       Personal Care Assistance Level of Assistance  Bathing, Feeding, Dressing Bathing Assistance: Limited assistance Feeding assistance: Independent Dressing Assistance: Maximum assistance     Functional Limitations Info  Sight, Hearing, Speech Sight Info:  Adequate Hearing Info: Adequate Speech Info: Adequate    SPECIAL CARE FACTORS FREQUENCY  PT (By licensed PT), OT (By licensed OT)     PT Frequency: 5x's/week OT Frequency: 5x's/week            Contractures Contractures Info: Not present    Additional Factors Info  Code Status, Allergies, Psychotropic Code Status Info: Full Allergies Info: NKA Psychotropic Info: Librium (chlordiazepoxide), Clozaril (clozapine)         Current Medications (02/02/2021):  This is the current hospital active medication list Current Facility-Administered Medications  Medication Dose Route Frequency Provider Last Rate Last Admin   0.9 %  sodium chloride infusion  250 mL Intravenous PRN Wouk, Ailene Rud, MD       0.9 %  sodium chloride infusion   Intravenous Continuous Athena Masse, MD 125 mL/hr at 02/02/21 0044 New Bag at 02/02/21 0044   aspirin tablet 325 mg  325 mg Oral BID [name], DPM   325 mg at 02/02/21 0848   chlordiazePOXIDE (LIBRIUM) capsule 10 mg  10 mg Oral BID Gwynne Edinger, MD   10 mg at 02/02/21 0848   cloZAPine (CLOZARIL) tablet 100 mg  100 mg Oral Daily Gwynne Edinger, MD   100 mg at 02/02/21 0849   cloZAPine (CLOZARIL) tablet 400 mg  400 mg Oral QHS Gwynne Edinger, MD   400 mg at 02/01/21 2158   famotidine (PEPCID) tablet 10 mg  10 mg Oral Daily Wouk, Ailene Rud, MD   10 mg  at 02/02/21 0848   feeding supplement (ENSURE ENLIVE / ENSURE PLUS) liquid 237 mL  237 mL Oral BID BM Dahal, Marlowe Aschoff, MD   237 mL at 02/02/21 0849   fentaNYL (SUBLIMAZE) injection 50 mcg  50 mcg Intravenous UD Wouk, Ailene Rud, MD   50 mcg at 01/29/21 1432   ibuprofen (ADVIL) tablet 600 mg  600 mg Oral Q6H PRN Wouk, Ailene Rud, MD       lactated ringers infusion   Intravenous Continuous Gwynne Edinger, MD 10 mL/hr at 01/29/21 1625 New Bag at 01/29/21 1707   linaclotide (LINZESS) capsule 72 mcg  72 mcg Oral Daily Gwynne Edinger, MD   72 mcg at 02/02/21 0849   midazolam  (VERSED) injection 1-2 mg  1-2 mg Intravenous UD Wouk, Ailene Rud, MD       multivitamin with minerals tablet 1 tablet  1 tablet Oral Daily Dahal, Marlowe Aschoff, MD   1 tablet at 02/02/21 0849   oxyCODONE (Oxy IR/ROXICODONE) immediate release tablet 5 mg  5 mg Oral Q6H PRN Gwynne Edinger, MD   5 mg at 02/01/21 1343   pantoprazole (PROTONIX) EC tablet 40 mg  40 mg Oral Daily Dahal, Marlowe Aschoff, MD   40 mg at 02/02/21 0848   polyethylene glycol (MIRALAX / GLYCOLAX) packet 17 g  17 g Oral Daily Gwynne Edinger, MD   17 g at 02/02/21 0848   senna (SENOKOT) tablet 8.6 mg  1 tablet Oral QHS Gwynne Edinger, MD   8.6 mg at 02/01/21 2158   simvastatin (ZOCOR) tablet 40 mg  40 mg Oral q1800 Gwynne Edinger, MD   40 mg at 02/01/21 1720   sodium chloride flush (NS) 0.9 % injection 3 mL  3 mL Intravenous Q12H Gwynne Edinger, MD   3 mL at 01/31/21 2052   sodium chloride flush (NS) 0.9 % injection 3 mL  3 mL Intravenous PRN Wouk, Ailene Rud, MD         Discharge Medications: Please see discharge summary for a list of discharge medications.  Relevant Imaging Results:  Relevant Lab Results:   Additional Information SSN: [id number]  Sherie Don, LCSW

## 2021-02-02 NOTE — Care Management Important Message (Signed)
Important Message  Patient Details IM Letter given to the Patient. Name: Nancy Brooks MRN: [id number] Date of Birth: May 03, 1951   Medicare Important Message Given:  Yes     Kerin Salen 02/02/2021, 11:33 AM

## 2021-02-02 NOTE — Progress Notes (Signed)
Occupational Therapy Treatment Patient Details Name: Nancy Brooks MRN: [id number] DOB: [birth year] Today's Date: 02/02/2021   History of present illness Nancy Brooks is a 70 y.o. female with PMH significant for schizophrenia, anxiety/depression, osteoporosis, esophageal dyskinesis and history of Lisfranc injury of left foot.  On 12/11/2020, patient underwent open reduction of Lisfranc fracture on the left with reduction of dislocation and percutaneous pinning.  On 9/23, patient underwent left foot tarsometatarsal arthrodesis for metatarsal fracture. Patient NWB   OT comments  Patient in bed upon arrival, asking to use bathroom. Needing min A for steadying and cues to hop to maintain NWB, patient able to do so ~75% of transfer to bedside commode. Needs max A for clothing management due to reliant on UE support on walker due WB status, able to perform peri care seated/lateral leans. Patient min A to transfer back to bed, turns quickly and sits with limited eccentric control onto bed needing cues for safety.    Recommendations for follow up therapy are one component of a multi-disciplinary discharge planning process, led by the attending physician.  Recommendations may be updated based on patient status, additional functional criteria and insurance authorization.    Follow Up Recommendations  SNF    Equipment Recommendations  None recommended by OT       Precautions / Restrictions Precautions Precautions: Fall Required Braces or Orthoses: Other Brace Other Brace: camboot LLE Restrictions Weight Bearing Restrictions: Yes LLE Weight Bearing: Non weight bearing       Mobility Bed Mobility Overal bed mobility: Modified Independent                  Transfers Overall transfer level: Needs assistance Equipment used: Rolling walker (2 wheeled) Transfers: Sit to/from [facility] Sit to Stand: Min assist Stand pivot transfers: Min assist       General transfer  comment: verbal cues for hand placement, min A for safety as patient having difficulty maintaining NWB and is impulsive with turning walker to sit back onto edge of bed    Balance Overall balance assessment: Needs assistance Sitting-balance support: Feet supported Sitting balance-Leahy Scale: Good     Standing balance support: Bilateral upper extremity supported Standing balance-Leahy Scale: Poor                             ADL either performed or assessed with clinical judgement   ADL Overall ADL's : Needs assistance/impaired     Grooming: Wash/dry hands;Set up;Sitting               Lower Body Dressing: Supervision/safety;Sitting/lateral leans Lower Body Dressing Details (indicate cue type and reason): to pull up R sock seated edge of bed Toilet Transfer: Minimal assistance;Stand-pivot;Cueing for sequencing;Cueing for safety;BSC;RW Toilet Transfer Details (indicate cue type and reason): difficulty maintaining NWB of L LE Toileting- Clothing Manipulation and Hygiene: Maximal assistance;Sitting/lateral lean;Sit to/from stand Toileting - Clothing Manipulation Details (indicate cue type and reason): due to reliant on UE support in standing to manage brief, able to perform peri care after voiding in sitting/lateral leans     Functional mobility during ADLs: Minimal assistance;Rolling walker;Cueing for sequencing;Cueing for safety        Cognition Arousal/Alertness: Awake/alert Behavior During Therapy: Flat affect Overall Cognitive Status: No family/caregiver present to determine baseline cognitive functioning  General Comments: repeatedly states "can't touch it to the floor and nothing can touch it" referring to NWB L foot                   Pertinent Vitals/ Pain       Pain Assessment: Faces Faces Pain Scale: No hurt         Frequency  Min 2X/week        Progress Toward Goals  OT Goals(current  goals can now be found in the care plan section)  Progress towards OT goals: Progressing toward goals  Acute Rehab OT Goals Patient Stated Goal: use bathroom OT Goal Formulation: With patient Time For Goal Achievement: 02/13/21 Potential to Achieve Goals: Good ADL Goals Pt Will Perform Lower Body Dressing: with min assist;sitting/lateral leans;bed level Pt Will Transfer to Toilet: with min guard assist;bedside commode;regular height toilet;stand pivot transfer Pt Will Perform Toileting - Clothing Manipulation and hygiene: with min guard assist;sitting/lateral leans Additional ADL Goal #1: Patient will stand x 1 min with RW as avidence of improving strength and endurance - and as needed for ADLs.  Plan Discharge plan remains appropriate       AM-PAC OT "6 Clicks" Daily Activity     Outcome Measure   Help from another person eating meals?: None Help from another person taking care of personal grooming?: None Help from another person toileting, which includes using toliet, bedpan, or urinal?: A Lot Help from another person bathing (including washing, rinsing, drying)?: A Lot Help from another person to put on and taking off regular upper body clothing?: A Little Help from another person to put on and taking off regular lower body clothing?: A Lot 6 Click Score: 17    End of Session Equipment Utilized During Treatment: Gait belt;Rolling walker  OT Visit Diagnosis: Other abnormalities of gait and mobility (R26.89);Pain;Other symptoms and signs involving cognitive function   Activity Tolerance Patient tolerated treatment well   Patient Left in bed;with call bell/phone within reach;with bed alarm set   Nurse Communication Mobility status        Time: 3546-5681 OT Time Calculation (min): 14 min  Charges: OT General Charges $OT Visit: 1 Visit OT Treatments $Self Care/Home Management : 8-22 mins  Delbert Phenix OT OT pager: [phone]   Rosemary Holms 02/02/2021, 10:42 AM

## 2021-02-02 NOTE — TOC Progression Note (Signed)
Transition of Care [facility]) - Progression Note   Patient Details  Name: Nancy Brooks MRN: [id number] Date of Birth: [birth year]  Transition of Care [facility]) CM/SW [place], LCSW Phone Number: 02/02/2021, 2:05 PM  Clinical Narrative: Additional documents uploaded to [place] for review. CSW confirmed with sister that she wants the patient to return to [facility] for rehab. CSW confirmed with Narda Rutherford the patient can be admitted tomorrow if PASRR has resulted. CSW started insurance authorization. Reference ID # is: [id number]. TOC awaiting PASRR completion.  Expected Discharge Plan: [place] Barriers to Discharge: Continued Medical Work up  Expected Discharge Plan and Services Expected Discharge Plan: [place] In-house Referral: Clinical Social Work Post Acute Care Choice: [place] Living arrangements for the past 2 months: Single Family Home  Readmission Risk Interventions No flowsheet data found.

## 2021-02-03 DIAGNOSIS — S93325D Dislocation of tarsometatarsal joint of left foot, subsequent encounter: Secondary | ICD-10-CM | POA: Diagnosis not present

## 2021-02-03 NOTE — Progress Notes (Signed)
Did not see patient today but should be stable for discharge.  She can follow-up with me in 1 week in my office, we will reschedule her appointment from tomorrow to next week.  Continue nonweightbearing LLE.  No dressing changes required.  Please call if questions  Lanae Crumbly, [facility] 02/03/2021

## 2021-02-03 NOTE — Progress Notes (Signed)
PROGRESS NOTE  Nancy Brooks  DOB: 05/30/1950  PCP: Hoyt Koch, MD DGU:[id number]  DOA: 01/29/2021  LOS: 5 days  Hospital Day: 6  Chief complaint: Leg pain  Brief narrative: Nancy Brooks is a 70 y.o. female with PMH significant for schizophrenia, anxiety/depression, osteoporosis, esophageal dyskinesis and history of Lisfranc injury of left foot. On 12/11/2020, patient underwent open reduction of Lisfranc fracture on the left with reduction of dislocation and percutaneous pinning. On 9/23, patient underwent left foot tarsometatarsal arthrodesis for metatarsal fracture. Postoperative, admitted to hospital service for medical management See below for details  Subjective: Patient was seen and examined this morning.  Lying on bed.  Not in distress.  No new symptoms.   Asking when she will have a bed available at rehab.   No family at bedside.  Assessment/Plan: Left foot Lisfranc injury -First surgery on 8/5, second on 9/23. -Postop pain control, PT OT consult appreciated.  SNF recommended. -Per podiatry, strict nonweightbearing on left leg.  Keep left leg elevated -DVT prophylaxis with aspirin 325 mg twice daily per podiatry.  Continue Protonix for GI prophylaxis.  Leukocytosis -WBC count improved on repeat check on 9/27. Recent Labs  Lab 01/29/21 2124 01/30/21 0411 02/02/21 0458  WBC 7.6 12.2* 6.9    History of psychiatric disorder including schizophrenia, anxiety, depression -Continue psych meds which includes Librium, clozapine.  Chronic constipation -Continue senna, Linzess, MiraLAX.  Impaired mobility -PT OT eval obtained. -Patient was previously at [facility] and prefers to go there if SNF is recommended.   Mobility: PT eval appreciated. Code Status:   Code Status: Full Code  Nutritional status: Body mass index is 23.44 kg/m. Nutrition Problem: Increased nutrient needs Etiology: post-op healing Signs/Symptoms: estimated needs Diet:  Diet  Order             Diet regular Room service appropriate? No; Fluid consistency: Thin  Diet effective ____                  DVT prophylaxis:    Aspirin 325 mg twice daily per podiatry.     Antimicrobials: Per podiatry Fluid: None Consultants: Podiatry Family Communication: Family not at bedside  Status is: Inpatient  Remains inpatient appropriate because: Pending placement  Dispo: The patient is from: Home              Anticipated d/c is to: SNF              Patient currently is medically stable to d/c.   Difficult to place patient No     Infusions:   sodium chloride     sodium chloride 125 mL/hr at 02/02/21 0044   lactated ringers 10 mL/hr at 01/29/21 1625    Scheduled Meds:  aspirin  325 mg Oral BID   chlordiazePOXIDE  10 mg Oral BID   cloZAPine  100 mg Oral Daily   cloZAPine  400 mg Oral QHS   famotidine  10 mg Oral Daily   feeding supplement  237 mL Oral BID BM   fentaNYL (SUBLIMAZE) injection  50 mcg Intravenous UD   linaclotide  72 mcg Oral Daily   midazolam  1-2 mg Intravenous UD   multivitamin with minerals  1 tablet Oral Daily   pantoprazole  40 mg Oral Daily   polyethylene glycol  17 g Oral Daily   senna  1 tablet Oral QHS   simvastatin  40 mg Oral q1800   sodium chloride flush  3 mL Intravenous Q12H  Antimicrobials: Anti-infectives (From admission, onward)    Start     Dose/Rate Route Frequency Ordered Stop   01/29/21 2200  ceFAZolin (ANCEF) IVPB 1 g/50 mL premix        1 g 100 mL/hr over 30 Minutes Intravenous Every 8 hours 01/29/21 1918 01/30/21 1448   01/29/21 1400  ceFAZolin (ANCEF) IVPB 2g/100 mL premix        2 g 200 mL/hr over 30 Minutes Intravenous On call to O.R. 01/29/21 1348 01/29/21 1636       PRN meds: sodium chloride, ibuprofen, oxyCODONE, sodium chloride flush   Objective: Vitals:   02/02/21 1334 02/02/21 2127  BP: (!) 112/57 116/65  Pulse: (!) 102 (!) 106  Resp: 18 18  Temp: 98.8 F (37.1 C) 98.1 F (36.7 C)   SpO2: 99% 97%    Intake/Output Summary (Last 24 hours) at 02/03/2021 1138 Last data filed at 02/03/2021 1000 Gross per 24 hour  Intake 1068.33 ml  Output 700 ml  Net 368.33 ml    Filed Weights   01/28/21 1308 01/29/21 1411  Weight: 54.4 kg 54.4 kg   Weight change:  Body mass index is 23.44 kg/m.   Physical Exam: General exam: Pleasant, elderly Caucasian female.  Thin built.  Not in physical distress.  No new symptoms.   Skin: No rashes, lesions or ulcers. HEENT: Atraumatic, normocephalic, no obvious bleeding Lungs: Clear to auscultation bilaterally CVS: Regular rate and rhythm, no murmur GI/Abd soft, nontender, nondistended, bowel present CNS: Alert, awake, mumbling, able to answer few questions.  Baseline cognitive issues. Psychiatry: Depressed look Extremities: No pedal edema, no calf tenderness.  Left foot on immobilizer.  Data Review: I have personally reviewed the laboratory data and studies available.  Recent Labs  Lab 01/29/21 2124 01/30/21 0411 02/02/21 0458  WBC 7.6 12.2* 6.9  NEUTROABS  --  11.5*  --   HGB 12.5 10.9* 9.4*  HCT 39.0 33.7* 29.6*  MCV 90.3 89.4 90.8  PLT 248 244 195    Recent Labs  Lab 01/29/21 2124 01/30/21 0411  NA  --  138  K  --  4.0  CL  --  103  CO2  --  27  GLUCOSE  --  142*  BUN  --  17  CREATININE 0.81 0.78  CALCIUM  --  8.8*     F/u labs ordered. Unresulted Labs (From admission, onward)    None       Signed, Terrilee Croak, MD [facility] 02/03/2021

## 2021-02-03 NOTE — TOC Progression Note (Signed)
Transition of Care [facility]) - Progression Note   Patient Details  Name: [name] MRN: [id number] Date of Birth: 08-Apr-1951  Transition of Care [facility]) CM/SW [place], LCSW Phone Number: 02/03/2021, 2:05 PM  Clinical Narrative: [facility] is still [facility]. CSW followed up with PASRR to see if the patient could be expedited so she can start rehab. PASRR would not expedite the patient as she was assigned a level II PASRR and the assigned worker has up to 3 business days to complete it. CSW updated Janie with [facility] and patient's sister. TOC to follow.  Expected Discharge Plan: Skilled Nursing Facility Barriers to Discharge: [place] [facility])  Expected Discharge Plan and Services Expected Discharge Plan: [place] In-house Referral: Clinical Social Work Post Acute Care Choice: [place] Living arrangements for the past 2 months: Single Family Home  Readmission Risk Interventions No flowsheet data found.

## 2021-02-04 ENCOUNTER — Encounter: Payer: Medicare Other | Admitting: Gastroenterology

## 2021-02-04 ENCOUNTER — Encounter: Payer: Medicare Other | Admitting: Podiatry

## 2021-02-04 DIAGNOSIS — S92302A Fracture of unspecified metatarsal bone(s), left foot, initial encounter for closed fracture: Secondary | ICD-10-CM | POA: Diagnosis not present

## 2021-02-04 DIAGNOSIS — F209 Schizophrenia, unspecified: Secondary | ICD-10-CM | POA: Diagnosis not present

## 2021-02-04 DIAGNOSIS — E785 Hyperlipidemia, unspecified: Secondary | ICD-10-CM | POA: Diagnosis not present

## 2021-02-04 DIAGNOSIS — S93325D Dislocation of tarsometatarsal joint of left foot, subsequent encounter: Secondary | ICD-10-CM | POA: Diagnosis not present

## 2021-02-04 NOTE — Plan of Care (Signed)
  Problem: Activity: Goal: Risk for activity intolerance will decrease Outcome: Progressing   Problem: Pain Managment: Goal: General experience of comfort will improve Outcome: Progressing   Problem: Safety: Goal: Ability to remain free from injury will improve Outcome: Progressing   

## 2021-02-04 NOTE — Progress Notes (Signed)
PROGRESS NOTE  Nancy Brooks  QQP:[id number] DOB: 09-Mar-1951 DOA: 01/29/2021 PCP: Hoyt Koch, MD   Brief Narrative: Nancy Brooks is a 70 y.o. female with PMH significant for schizophrenia, anxiety/depression, osteoporosis, esophageal dyskinesis and history of Lisfranc injury of left foot s/p open reduction and percutaneous pinning 8/5 and subsequent tarsometatarsal arthrodesis for metatarsal fracture on 9/23 after which she was admitted to the hospital for postoperative pain control and PT evaluation. SNF has been recommended and is delayed by PASRR manual review.   Assessment & Plan: Active Problems:   Hyperlipidemia   Schizophrenia, unspecified type ([place])   Lisfranc dislocation   Closed displaced fracture of metatarsal bone of left foot  Left foot Lisfranc injury: s/p first surgery on 8/5, second on 9/23. - Per podiatry, strict nonweightbearing on left leg.  Keep left leg elevated  Leukocytosis: Resolved without directed Tx.   History of psychiatric disorder including schizophrenia, anxiety, depression: This is currently stable, not impacting medical care.  - Continue home meds of librium, clozapine.   Chronic constipation - Continue senna, miralax, linzess.   Impaired mobility - PT OT eval obtained and recommended SNF. Pt prefers [facility] to which she has been accepted pending PASRR manual review.  DVT prophylaxis: Aspirin 325mg  BID per podiatry Code Status: Full Family Communication: None at bedside Disposition Plan:  Status is: Inpatient  Remains inpatient appropriate because:Unsafe d/c plan  Dispo: The patient is from: Home              Anticipated d/c is to: SNF              Patient currently is medically stable to d/c. Awaiting PASRR.  Consultants:  Podiatry, Dr. Sherryle Lis  Procedures:  01/29/21 ARTHRODESIS TARSOMETATARSAL LEFT FOOT [name], DPM    Antimicrobials: Ancef perioperatively   Subjective: No complaints. Pain is  controlled with intermittent prns of oxycodone (once all of yesterday, none thus far today)  Objective: Vitals:   02/03/21 1329 02/03/21 1333 02/03/21 2242 02/04/21 0509  BP: (!) 86/35 (!) 93/49 (!) 93/53 103/65  Pulse: (!) 102 98 88 85  Resp: 18 17 20 20   Temp: 98.7 F (37.1 C) 98.8 F (37.1 C) 98.7 F (37.1 C) (!) 97.4 F (36.3 C)  TempSrc: Oral Oral Oral Oral  SpO2: 97% 99% 97% 100%  Weight:      Height:        Intake/Output Summary (Last 24 hours) at 02/04/2021 1334 Last data filed at 02/04/2021 1300 Gross per 24 hour  Intake 240 ml  Output 300 ml  Net -60 ml   Filed Weights   01/28/21 1308 01/29/21 1411  Weight: 54.4 kg 54.4 kg    Gen: 70 y.o. female in no distress Pulm: Non-labored breathing room air. Clear to auscultation bilaterally.  CV: Regular rate and rhythm. No murmur, rub, or gallop. No JVD, no pedal edema. GI: Abdomen soft, non-tender, non-distended, with normoactive bowel sounds. No organomegaly or masses felt. Ext: Warm, no deformities Skin: No rashes, lesions or ulcers on visualized skin. Dressing L foot c/d/i   Neuro: Alert and oriented. No focal neurological deficits. Psych: Judgement and insight appear intact. Mood & affect appropriate.   Data Reviewed: I have personally reviewed following labs and imaging studies  CBC: Recent Labs  Lab 01/29/21 2124 01/30/21 0411 02/02/21 0458  WBC 7.6 12.2* 6.9  NEUTROABS  --  11.5*  --   HGB 12.5 10.9* 9.4*  HCT 39.0 33.7* 29.6*  MCV 90.3 89.4  90.8  PLT 248 244 509   Basic Metabolic Panel: Recent Labs  Lab 01/29/21 2124 01/30/21 0411  NA  --  138  K  --  4.0  CL  --  103  CO2  --  27  GLUCOSE  --  142*  BUN  --  17  CREATININE 0.81 0.78  CALCIUM  --  8.8*   GFR: Estimated Creatinine Clearance: 49 mL/min (by C-G formula based on SCr of 0.78 mg/dL). Liver Function Tests: No results for input(s): AST, ALT, ALKPHOS, BILITOT, PROT, ALBUMIN in the last 168 hours. No results for input(s): LIPASE,  AMYLASE in the last 168 hours. No results for input(s): AMMONIA in the last 168 hours. Coagulation Profile: No results for input(s): INR, PROTIME in the last 168 hours. Cardiac Enzymes: No results for input(s): CKTOTAL, CKMB, CKMBINDEX, TROPONINI in the last 168 hours. BNP (last 3 results) No results for input(s): PROBNP in the last 8760 hours. HbA1C: No results for input(s): HGBA1C in the last 72 hours. CBG: Recent Labs  Lab 01/30/21 0804  GLUCAP 117*   Lipid Profile: No results for input(s): CHOL, HDL, LDLCALC, TRIG, CHOLHDL, LDLDIRECT in the last 72 hours. Thyroid Function Tests: No results for input(s): TSH, T4TOTAL, FREET4, T3FREE, THYROIDAB in the last 72 hours. Anemia Panel: No results for input(s): VITAMINB12, FOLATE, FERRITIN, TIBC, IRON, RETICCTPCT in the last 72 hours. Urine analysis:    Component Value Date/Time   COLORURINE AMBER (A) 12/16/2020 1900   APPEARANCEUR TURBID (A) 12/16/2020 1900   LABSPEC 1.036 (H) 12/16/2020 1900   PHURINE 5.0 12/16/2020 1900   GLUCOSEU NEGATIVE 12/16/2020 1900   GLUCOSEU NEGATIVE 10/05/2011 1146   HGBUR NEGATIVE 12/16/2020 1900   BILIRUBINUR NEGATIVE 12/16/2020 1900   KETONESUR 5 (A) 12/16/2020 1900   PROTEINUR 30 (A) 12/16/2020 1900   UROBILINOGEN 0.2 10/05/2011 1146   NITRITE NEGATIVE 12/16/2020 1900   LEUKOCYTESUR NEGATIVE 12/16/2020 1900   Recent Results (from the past 240 hour(s))  SARS Coronavirus 2 by RT PCR (hospital order, performed in [place] hospital lab) Nasopharyngeal Nasopharyngeal Swab     Status: None   Collection Time: 01/29/21  1:51 PM   Specimen: Nasopharyngeal Swab  Result Value Ref Range Status   SARS Coronavirus 2 NEGATIVE NEGATIVE Final    Comment: (NOTE) SARS-CoV-2 target nucleic acids are NOT DETECTED.  The SARS-CoV-2 RNA is generally detectable in upper and lower respiratory specimens during the acute phase of infection. The lowest concentration of SARS-CoV-2 viral copies this assay can detect  is 250 copies / mL. A negative result does not preclude SARS-CoV-2 infection and should not be used as the sole basis for treatment or other patient management decisions.  A negative result may occur with improper specimen collection / handling, submission of specimen other than nasopharyngeal swab, presence of viral mutation(s) within the areas targeted by this assay, and inadequate number of viral copies (<250 copies / mL). A negative result must be combined with clinical observations, patient history, and epidemiological information.  Fact Sheet for Patients:   StrictlyIdeas.no  Fact Sheet for Healthcare Providers: BankingDealers.co.za  This test is not yet approved or  cleared by the [place] FDA and has been authorized for detection and/or diagnosis of SARS-CoV-2 by FDA under an Emergency Use Authorization (EUA).  This EUA will remain in effect (meaning this test can be used) for the duration of the COVID-19 declaration under Section 564(b)(1) of the Act, 21 U.S.C. section 360bbb-3(b)(1), unless the authorization is terminated or revoked  sooner.  Performed at [facility], [place] [address] [place], [place] [address]       Radiology Studies: No results found.  Scheduled Meds:  aspirin  325 mg Oral BID   chlordiazePOXIDE  10 mg Oral BID   cloZAPine  100 mg Oral Daily   cloZAPine  400 mg Oral QHS   famotidine  10 mg Oral Daily   feeding supplement  237 mL Oral BID BM   fentaNYL (SUBLIMAZE) injection  50 mcg Intravenous UD   linaclotide  72 mcg Oral Daily   midazolam  1-2 mg Intravenous UD   multivitamin with minerals  1 tablet Oral Daily   pantoprazole  40 mg Oral Daily   polyethylene glycol  17 g Oral Daily   senna  1 tablet Oral QHS   simvastatin  40 mg Oral q1800   sodium chloride flush  3 mL Intravenous Q12H   Continuous Infusions:  sodium chloride     lactated ringers 10 mL/hr at 01/29/21  1625     LOS: 6 days   Time spent: 25 minutes.  Patrecia Pour, MD [facility] [email or web address] 02/04/2021, 1:34 PM

## 2021-02-04 NOTE — TOC Progression Note (Signed)
Transition of Care [facility]) - Progression Note   Patient Details  Name: Nancy Brooks MRN: [id number] Date of Birth: 1950/12/20  Transition of Care [facility]) CM/SW [place], LCSW Phone Number: 02/04/2021, 3:21 PM  Clinical Narrative: Patient's PASRR is still pending. CSW updated sister. CSW requested an insurance authorization extension. Patient's reference # is: [id number] and the plan auth ID is: [id number]. Patient's new insurance authorization is good for 02/04/21-02/08/2021. TOC awaiting PASRR number.  Expected Discharge Plan: Skilled Nursing Facility Barriers to Discharge: [place] [facility])  Expected Discharge Plan and Services Expected Discharge Plan: [place] In-house Referral: Clinical Social Work Post Acute Care Choice: [place] Living arrangements for the past 2 months: Single Family Home Expected Discharge Date: 02/04/21                Readmission Risk Interventions No flowsheet data found.

## 2021-02-04 NOTE — Progress Notes (Signed)
Physical Therapy Treatment Patient Details Name: Nancy Brooks MRN: [id number] DOB: [birth year] Today's Date: 02/04/2021   History of Present Illness Nancy Brooks is a 70 y.o. female with PMH significant for schizophrenia, anxiety/depression, osteoporosis, esophageal dyskinesis and history of Lisfranc injury of left foot.  On 12/11/2020, patient underwent open reduction of Lisfranc fracture on the left with reduction of dislocation and percutaneous pinning.  On 9/23, patient underwent left foot tarsometatarsal arthrodesis for metatarsal fracture. Patient NWB    PT Comments    General Comments: AxO x 2 sweet - child like - following all directions and able to recall she was "no weight" NWB Assisted OOB to amb to bathroom General bed mobility comments: self able to transition to EOB General transfer comment: [place] for safety otherwise pt able to self rise and lower.  25% VC's on proper hand placement and safety with turns.  Also assisted with a toilet transfer.  Pt did well to stand on one foot but did get tired. General Gait Details: asssited with amb from bed to bathroom approx 8 feet.  pt did well to stay NWB but did have a tendency to push walker out too far front and did get fatigued.  Used a wheelchair to get her out of the bathroom.. Product manager mobility: Yes Wheelchair propulsion: Both upper extremities;Right lower extremity Distance: pt was able to self propell wc 85 feet with supervision using B UE's and some R LE (toe) she was happy to get out of the room. Assisted to recliner and positioned to comfort. Pt will need ST Rehab at SNF.   Recommendations for follow up therapy are one component of a multi-disciplinary discharge planning process, led by the attending physician.  Recommendations may be updated based on patient status, additional functional criteria and insurance authorization.  Follow Up Recommendations  SNF     Equipment Recommendations  None  recommended by PT    Recommendations for Other Services       Precautions / Restrictions Precautions Precautions: Fall Other Brace: camboot LLE Restrictions Weight Bearing Restrictions: Yes LLE Weight Bearing: Non weight bearing     Mobility  Bed Mobility Overal bed mobility: Modified Independent             General bed mobility comments: self able to transition to EOB    Transfers Overall transfer level: Needs assistance Equipment used: Rolling walker (2 wheeled) Transfers: Sit to/from [facility] Sit to Stand: Min assist Stand pivot transfers: Min assist       General transfer comment: [place] for safety otherwise pt able to self rise and lower.  25% VC's on proper hand placement and safety with turns.  Also assisted with a toilet transfer.  Pt did well to stand on one foot but did get tired.  Ambulation/Gait Ambulation/Gait assistance: Min assist;Mod assist Gait Distance (Feet): 8 Feet Assistive device: Rolling walker (2 wheeled) Gait Pattern/deviations: Step-to pattern Gait velocity: decr   General Gait Details: asssited with amb from bed to bathroom approx 8 feet.  pt did well to stay NWB but did have a tendency to push walker out too far front and did get fatigued.  Used a wheelchair to get her out of the bathroom.Electronics engineer mobility: Yes Wheelchair propulsion: Both upper extremities;Right lower extremity Distance: pt was able to self propell wc 85 feet with supervision using B UE's and  some R LE (toe) she was happy to get out of the room.  Modified Rankin (Stroke Patients Only)       Balance                                            Cognition Arousal/Alertness: Awake/alert Behavior During Therapy: WFL for tasks assessed/performed Overall Cognitive Status: Within Functional Limits for tasks assessed                                  General Comments: AxO x 2 sweet - child like - following all directions and able to recall she was "no weight" NWB      Exercises      General Comments        Pertinent Vitals/Pain Pain Assessment: No/denies pain Faces Pain Scale: No hurt    Home Living                      Prior Function            PT Goals (current goals can now be found in the care plan section) Progress towards PT goals: Progressing toward goals    Frequency    Min 3X/week      PT Plan Current plan remains appropriate    Co-evaluation              AM-PAC PT "6 Clicks" Mobility   Outcome Measure  Help needed turning from your back to your side while in a flat bed without using bedrails?: None Help needed moving from lying on your back to sitting on the side of a flat bed without using bedrails?: None Help needed moving to and from a bed to a chair (including a wheelchair)?: A Little Help needed standing up from a chair using your arms (e.g., wheelchair or bedside chair)?: A Little Help needed to walk in hospital room?: A Lot Help needed climbing 3-5 steps with a railing? : Total 6 Click Score: 17    End of Session Equipment Utilized During Treatment: Gait belt Activity Tolerance: Patient tolerated treatment well;Patient limited by fatigue Patient left: in chair;with call bell/phone within reach;with chair alarm set Nurse Communication: Mobility status PT Visit Diagnosis: Difficulty in walking, not elsewhere classified (R26.2);Muscle weakness (generalized) (M62.81)     Time: 4709-6283 PT Time Calculation (min) (ACUTE ONLY): 25 min  Charges:  $Gait Training: 8-22 mins $Therapeutic Activity: 8-22 mins                     Rica Koyanagi  PTA Acute  Rehabilitation Services Pager      [phone] Office      [phone]

## 2021-02-04 NOTE — Progress Notes (Signed)
Patient not seen today. Still stable for discharge. Will see tomorrow if still in house   Lanae Crumbly, [facility] 02/04/2021

## 2021-02-05 ENCOUNTER — Telehealth: Payer: Self-pay

## 2021-02-05 DIAGNOSIS — S93325D Dislocation of tarsometatarsal joint of left foot, subsequent encounter: Secondary | ICD-10-CM | POA: Diagnosis not present

## 2021-02-05 DIAGNOSIS — S92302A Fracture of unspecified metatarsal bone(s), left foot, initial encounter for closed fracture: Secondary | ICD-10-CM | POA: Diagnosis not present

## 2021-02-05 DIAGNOSIS — E785 Hyperlipidemia, unspecified: Secondary | ICD-10-CM | POA: Diagnosis not present

## 2021-02-05 DIAGNOSIS — F209 Schizophrenia, unspecified: Secondary | ICD-10-CM | POA: Diagnosis not present

## 2021-02-05 MED ORDER — ACETAMINOPHEN 325 MG PO TABS: 650.0000 mg | ORAL_TABLET | Freq: Four times a day (QID) | ORAL | Status: DC | PRN

## 2021-02-05 MED ORDER — IBUPROFEN 600 MG PO TABS: 600.0000 mg | ORAL_TABLET | Freq: Four times a day (QID) | ORAL | Status: DC | PRN

## 2021-02-05 MED ORDER — CHLORDIAZEPOXIDE HCL 10 MG PO CAPS: 10.0000 mg | ORAL_CAPSULE | Freq: Two times a day (BID) | ORAL | 0 refills | Status: DC

## 2021-02-05 MED ORDER — ASPIRIN 325 MG PO TABS: 325.0000 mg | ORAL_TABLET | Freq: Two times a day (BID) | ORAL | Status: AC
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
## 2021-02-05 NOTE — Care Management Important Message (Signed)
Medicare IM printed for Social Work at [facility] to be given to the patient 

## 2021-02-05 NOTE — Plan of Care (Signed)

## 2021-02-05 NOTE — Progress Notes (Addendum)
PROGRESS NOTE  Nancy Brooks  PYP:[id number] DOB: 11-04-1950 DOA: 01/29/2021 PCP: Hoyt Koch, MD   Brief Narrative: Nancy Brooks is a 70 y.o. female with PMH significant for schizophrenia, anxiety/depression, osteoporosis, esophageal dyskinesis and history of Lisfranc injury of left foot s/p open reduction and percutaneous pinning 8/5 and subsequent tarsometatarsal arthrodesis for metatarsal fracture on 9/23 after which she was admitted to the hospital for postoperative pain control and PT evaluation. SNF has been recommended and is delayed by PASRR manual review.   Assessment & Plan: Active Problems:   Hyperlipidemia   Schizophrenia, unspecified type ([place])   Lisfranc dislocation   Closed displaced fracture of metatarsal bone of left foot  Left foot Lisfranc injury: s/p first surgery on 8/5, second on 9/23. - Per podiatry, strict nonweightbearing on left leg.  Keep left leg elevated. - Will need outpatient follow up with Dr. Sherryle Lis who continues to follow while patient is hospitalized awaiting placement.  Leukocytosis: Resolved without directed Tx.   History of psychiatric disorder including schizophrenia, anxiety, depression: This is currently stable, not impacting medical care.  - Continue home meds of librium, clozapine.   Chronic constipation - Continue senna, miralax, linzess.   Impaired mobility - PT OT eval obtained and recommended SNF. Pt prefers [facility] to which she has been accepted pending PASRR manual review.  DVT prophylaxis: Aspirin 325mg  BID per podiatry Code Status: Full Family Communication: None at bedside Disposition Plan:  Status is: Inpatient  Remains inpatient appropriate because:Unsafe d/c plan  Dispo: The patient is from: Home              Anticipated d/c is to: SNF              Patient currently is medically stable to d/c. Awaiting PASRR.  Consultants:  Podiatry, Dr. Sherryle Lis  Procedures:  01/29/21 ARTHRODESIS TARSOMETATARSAL  LEFT FOOT [name], DPM   Antimicrobials: Ancef perioperatively   Subjective: No complaints today. Pain is controlled. Eating well. NAEON.  Objective: Vitals:   02/04/21 1412 02/04/21 2111 02/04/21 2111 02/05/21 0506  BP: (!) 96/45 (!) 107/53 (!) 107/53 118/61  Pulse: 97 (!) 105 (!) 105 88  Resp: 18 16 16 16   Temp: 98.2 F (36.8 C) 97.8 F (36.6 C) 97.8 F (36.6 C) (!) 97.3 F (36.3 C)  TempSrc: Oral Oral Oral Oral  SpO2: 98% 95% 97% 97%  Weight:      Height:        Intake/Output Summary (Last 24 hours) at 02/05/2021 1204 Last data filed at 02/05/2021 0900 Gross per 24 hour  Intake 840 ml  Output --  Net 840 ml   Filed Weights   01/28/21 1308 01/29/21 1411  Weight: 54.4 kg 54.4 kg   Gen: 70 y.o. female in no distress Pulm: Nonlabored breathing room air. Clear. CV: Regular rate and rhythm. No murmur, rub, or gallop. No JVD, no dependent edema. GI: Abdomen soft, non-tender, non-distended, with normoactive bowel sounds.  Ext: Warm, dry. LLE in postop shoe, toes with brisk cap refill, intact SILT and motor function.  Neuro: Alert and oriented. No focal neurological deficits. Psych: Judgement and insight appear fair. Mood euthymic & affect congruent. Behavior is appropriate.    Data Reviewed: I have personally reviewed following labs and imaging studies  CBC: Recent Labs  Lab 01/29/21 2124 01/30/21 0411 02/02/21 0458  WBC 7.6 12.2* 6.9  NEUTROABS  --  11.5*  --   HGB 12.5 10.9* 9.4*  HCT 39.0 33.7* 29.6*  MCV 90.3 89.4 90.8  PLT 248 244 761   Basic Metabolic Panel: Recent Labs  Lab 01/29/21 2124 01/30/21 0411  NA  --  138  K  --  4.0  CL  --  103  CO2  --  27  GLUCOSE  --  142*  BUN  --  17  CREATININE 0.81 0.78  CALCIUM  --  8.8*   GFR: Estimated Creatinine Clearance: 49 mL/min (by C-G formula based on SCr of 0.78 mg/dL). Liver Function Tests: No results for input(s): AST, ALT, ALKPHOS, BILITOT, PROT, ALBUMIN in the last 168 hours. No  results for input(s): LIPASE, AMYLASE in the last 168 hours. No results for input(s): AMMONIA in the last 168 hours. Coagulation Profile: No results for input(s): INR, PROTIME in the last 168 hours. Cardiac Enzymes: No results for input(s): CKTOTAL, CKMB, CKMBINDEX, TROPONINI in the last 168 hours. BNP (last 3 results) No results for input(s): PROBNP in the last 8760 hours. HbA1C: No results for input(s): HGBA1C in the last 72 hours. CBG: Recent Labs  Lab 01/30/21 0804  GLUCAP 117*   Lipid Profile: No results for input(s): CHOL, HDL, LDLCALC, TRIG, CHOLHDL, LDLDIRECT in the last 72 hours. Thyroid Function Tests: No results for input(s): TSH, T4TOTAL, FREET4, T3FREE, THYROIDAB in the last 72 hours. Anemia Panel: No results for input(s): VITAMINB12, FOLATE, FERRITIN, TIBC, IRON, RETICCTPCT in the last 72 hours. Urine analysis:    Component Value Date/Time   COLORURINE AMBER (A) 12/16/2020 1900   APPEARANCEUR TURBID (A) 12/16/2020 1900   LABSPEC 1.036 (H) 12/16/2020 1900   PHURINE 5.0 12/16/2020 1900   GLUCOSEU NEGATIVE 12/16/2020 1900   GLUCOSEU NEGATIVE 10/05/2011 1146   HGBUR NEGATIVE 12/16/2020 1900   BILIRUBINUR NEGATIVE 12/16/2020 1900   KETONESUR 5 (A) 12/16/2020 1900   PROTEINUR 30 (A) 12/16/2020 1900   UROBILINOGEN 0.2 10/05/2011 1146   NITRITE NEGATIVE 12/16/2020 1900   LEUKOCYTESUR NEGATIVE 12/16/2020 1900   Recent Results (from the past 240 hour(s))  SARS Coronavirus 2 by RT PCR (hospital order, performed in [place] hospital lab) Nasopharyngeal Nasopharyngeal Swab     Status: None   Collection Time: 01/29/21  1:51 PM   Specimen: Nasopharyngeal Swab  Result Value Ref Range Status   SARS Coronavirus 2 NEGATIVE NEGATIVE Final    Comment: (NOTE) SARS-CoV-2 target nucleic acids are NOT DETECTED.  The SARS-CoV-2 RNA is generally detectable in upper and lower respiratory specimens during the acute phase of infection. The lowest concentration of SARS-CoV-2 viral  copies this assay can detect is 250 copies / mL. A negative result does not preclude SARS-CoV-2 infection and should not be used as the sole basis for treatment or other patient management decisions.  A negative result may occur with improper specimen collection / handling, submission of specimen other than nasopharyngeal swab, presence of viral mutation(s) within the areas targeted by this assay, and inadequate number of viral copies (<250 copies / mL). A negative result must be combined with clinical observations, patient history, and epidemiological information.  Fact Sheet for Patients:   StrictlyIdeas.no  Fact Sheet for Healthcare Providers: BankingDealers.co.za  This test is not yet approved or  cleared by the [place] FDA and has been authorized for detection and/or diagnosis of SARS-CoV-2 by FDA under an Emergency Use Authorization (EUA).  This EUA will remain in effect (meaning this test can be used) for the duration of the COVID-19 declaration under Section 564(b)(1) of the Act, 21 U.S.C. section 360bbb-3(b)(1), unless the authorization is  terminated or revoked sooner.  Performed at [facility], [place] [address] [place], [place] [address]       Radiology Studies: No results found.  Scheduled Meds:  aspirin  325 mg Oral BID   chlordiazePOXIDE  10 mg Oral BID   cloZAPine  100 mg Oral Daily   cloZAPine  400 mg Oral QHS   famotidine  10 mg Oral Daily   feeding supplement  237 mL Oral BID BM   fentaNYL (SUBLIMAZE) injection  50 mcg Intravenous UD   linaclotide  72 mcg Oral Daily   midazolam  1-2 mg Intravenous UD   multivitamin with minerals  1 tablet Oral Daily   pantoprazole  40 mg Oral Daily   polyethylene glycol  17 g Oral Daily   senna  1 tablet Oral QHS   simvastatin  40 mg Oral q1800   sodium chloride flush  3 mL Intravenous Q12H   Continuous Infusions:  sodium chloride     lactated  ringers 10 mL/hr at 01/29/21 1625     LOS: 7 days   Time spent: 15 minutes.  Patrecia Pour, MD [facility] [email or web address] 02/05/2021, 12:04 PM

## 2021-02-05 NOTE — Discharge Summary (Addendum)
Physician Discharge Summary  Nancy Brooks QQP:[id number] DOB: 1951-01-29 DOA: 01/29/2021  PCP: Hoyt Koch, MD  Admit date: 01/29/2021 Discharge date: 02/06/2021  Admitted From: Home Disposition: SNF   Recommendations for Outpatient Follow-up:  Follow up with Podiatry, Dr. Lanae Crumbly, in 1 week. Keep LLE elevated and strictly NWB.  Home Health: Per SNF Equipment/Devices: Per SNF Discharge Condition: Stable CODE STATUS: Full Diet recommendation: Heart healthy  Brief/Interim Summary: Nancy Brooks is a 70 y.o. female with PMH significant for schizophrenia, anxiety/depression, osteoporosis, esophageal dyskinesis and history of Lisfranc injury of left foot s/p open reduction and percutaneous pinning 8/5 and subsequent tarsometatarsal arthrodesis for metatarsal fracture on 9/23 after which she was admitted to the hospital for postoperative pain control and PT evaluation. SNF has been recommended and is being pursued.   Discharge Diagnoses:  Active Problems:   Hyperlipidemia   Schizophrenia, unspecified type ([place])   Lisfranc dislocation   Closed displaced fracture of metatarsal bone of left foot  Left foot Lisfranc injury: s/p first surgery on 8/5, second on 9/23. - Per podiatry, strict nonweightbearing on left leg.  Keep left leg elevated. - Will need outpatient follow up with Dr. Sherryle Lis   - Continue pain control with tlyenol, ibuprofen. Has not required opioid analgesic in >48 hours.   Leukocytosis: Resolved without directed Tx.   History of psychiatric disorder including schizophrenia, anxiety, depression: This is currently stable, not impacting medical care.  - Continue home meds of librium, clozapine.   Chronic constipation - Continue senna, miralax, linzess.   Impaired mobility - PT OT eval obtained and recommended SNF   Discharge Instructions  Allergies as of 02/05/2021   No Known Allergies      Medication List     STOP taking these medications     HYDROcodone-acetaminophen 5-325 MG tablet Commonly known as: NORCO/VICODIN       TAKE these medications    acetaminophen 325 MG tablet Commonly known as: Tylenol Take 2 tablets (650 mg total) by mouth every 6 (six) hours as needed for mild pain.   calcium elemental as carbonate 400 MG chewable tablet Commonly known as: BARIATRIC TUMS ULTRA Chew 1,000 mg by mouth daily.   chlordiazePOXIDE 10 MG capsule Commonly known as: LIBRIUM Take 1 capsule (10 mg total) by mouth in the morning and at bedtime for 3 days.   cholecalciferol 25 MCG (1000 UNIT) tablet Commonly known as: VITAMIN D3 Take 1,000 Units by mouth daily.   cloZAPine 100 MG tablet Commonly known as: CLOZARIL TAKE 1 TABLET BY MOUTH EVERY MORNING AND 4 TABLETS EVERY EVENING   famotidine 10 MG tablet Commonly known as: PEPCID Take 10 mg by mouth daily.   ibuprofen 600 MG tablet Commonly known as: ADVIL Take 1 tablet (600 mg total) by mouth every 6 (six) hours as needed for fever, headache or moderate pain (severe pain).   Linzess 72 MCG capsule Generic drug: linaclotide Take 72 mcg by mouth daily.   polyethylene glycol 17 g packet Commonly known as: MIRALAX / GLYCOLAX Take 17 g by mouth daily.   senna 8.6 MG Tabs tablet Commonly known as: SENOKOT Take 1 tablet by mouth at bedtime.   simvastatin 40 MG tablet Commonly known as: ZOCOR TAKE 1 TABLET(40 MG) BY MOUTH DAILY AT 6 PM   vitamin B-12 1000 MCG tablet Commonly known as: CYANOCOBALAMIN Take 1 tablet (1,000 mcg total) by mouth daily.        Follow-up Information     Nancy Brooks,  DPM. Go on 02/11/2021.   Specialty: Podiatry Why: 3:30 PM, For wound re-check Contact information: [place] [address] [phone]                No Known Allergies  Consultations: Podiatry  Procedures/Studies: 01/29/21 ARTHRODESIS TARSOMETATARSAL LEFT FOOT Lanae Crumbly, DPM   Subjective: No complaints today. Pain is  controlled. Eating well. NAEON. No subjective fever or chills. Pain controlled with ibuprofen last night, last oxycodone was 9/28 AM.  Discharge Exam: Vitals:   02/04/21 2111 02/05/21 0506  BP: (!) 107/53 118/61  Pulse: (!) 105 88  Resp: 16 16  Temp: 97.8 F (36.6 C) (!) 97.3 F (36.3 C)  SpO2: 97% 97%   Gen: 70 y.o. female in no distress Pulm: Nonlabored breathing room air. Clear. CV: Regular rate and rhythm. No murmur, rub, or gallop. No JVD, no dependent edema. GI: Abdomen soft, non-tender, non-distended, with normoactive bowel sounds.  Ext: Warm, dry. LLE in postop shoe, toes with brisk cap refill, intact SILT and motor function.  Neuro: Alert and oriented. No focal neurological deficits. Psych: Judgement and insight appear fair. Mood euthymic & affect congruent. Behavior is appropriate.    Labs: BNP (last 3 results) No results for input(s): BNP in the last 8760 hours. Basic Metabolic Panel: Recent Labs  Lab 01/29/21 2124 01/30/21 0411  NA  --  138  K  --  4.0  CL  --  103  CO2  --  27  GLUCOSE  --  142*  BUN  --  17  CREATININE 0.81 0.78  CALCIUM  --  8.8*   Liver Function Tests: No results for input(s): AST, ALT, ALKPHOS, BILITOT, PROT, ALBUMIN in the last 168 hours. No results for input(s): LIPASE, AMYLASE in the last 168 hours. No results for input(s): AMMONIA in the last 168 hours. CBC: Recent Labs  Lab 01/29/21 2124 01/30/21 0411 02/02/21 0458  WBC 7.6 12.2* 6.9  NEUTROABS  --  11.5*  --   HGB 12.5 10.9* 9.4*  HCT 39.0 33.7* 29.6*  MCV 90.3 89.4 90.8  PLT 248 244 195   Cardiac Enzymes: No results for input(s): CKTOTAL, CKMB, CKMBINDEX, TROPONINI in the last 168 hours. BNP: Invalid input(s): POCBNP CBG: Recent Labs  Lab 01/30/21 0804  GLUCAP 117*   D-Dimer No results for input(s): DDIMER in the last 72 hours. Hgb A1c No results for input(s): HGBA1C in the last 72 hours. Lipid Profile No results for input(s): CHOL, HDL, LDLCALC, TRIG,  CHOLHDL, LDLDIRECT in the last 72 hours. Thyroid function studies No results for input(s): TSH, T4TOTAL, T3FREE, THYROIDAB in the last 72 hours.  Invalid input(s): FREET3 Anemia work up No results for input(s): VITAMINB12, FOLATE, FERRITIN, TIBC, IRON, RETICCTPCT in the last 72 hours. Urinalysis    Component Value Date/Time   COLORURINE AMBER (A) 12/16/2020 1900   APPEARANCEUR TURBID (A) 12/16/2020 1900   LABSPEC 1.036 (H) 12/16/2020 1900   PHURINE 5.0 12/16/2020 1900   GLUCOSEU NEGATIVE 12/16/2020 1900   GLUCOSEU NEGATIVE 10/05/2011 1146   HGBUR NEGATIVE 12/16/2020 1900   BILIRUBINUR NEGATIVE 12/16/2020 1900   KETONESUR 5 (A) 12/16/2020 1900   PROTEINUR 30 (A) 12/16/2020 1900   UROBILINOGEN 0.2 10/05/2011 1146   NITRITE NEGATIVE 12/16/2020 1900   LEUKOCYTESUR NEGATIVE 12/16/2020 1900    Microbiology Recent Results (from the past 240 hour(s))  SARS Coronavirus 2 by RT PCR (hospital order, performed in [place] hospital lab) Nasopharyngeal Nasopharyngeal Swab     Status:  None   Collection Time: 01/29/21  1:51 PM   Specimen: Nasopharyngeal Swab  Result Value Ref Range Status   SARS Coronavirus 2 NEGATIVE NEGATIVE Final    Comment: (NOTE) SARS-CoV-2 target nucleic acids are NOT DETECTED.  The SARS-CoV-2 RNA is generally detectable in upper and lower respiratory specimens during the acute phase of infection. The lowest concentration of SARS-CoV-2 viral copies this assay can detect is 250 copies / mL. A negative result does not preclude SARS-CoV-2 infection and should not be used as the sole basis for treatment or other patient management decisions.  A negative result may occur with improper specimen collection / handling, submission of specimen other than nasopharyngeal swab, presence of viral mutation(s) within the areas targeted by this assay, and inadequate number of viral copies (<250 copies / mL). A negative result must be combined with clinical observations, patient  history, and epidemiological information.  Fact Sheet for Patients:   StrictlyIdeas.no  Fact Sheet for Healthcare Providers: BankingDealers.co.za  This test is not yet approved or  cleared by the [place] FDA and has been authorized for detection and/or diagnosis of SARS-CoV-2 by FDA under an Emergency Use Authorization (EUA).  This EUA will remain in effect (meaning this test can be used) for the duration of the COVID-19 declaration under Section 564(b)(1) of the Act, 21 U.S.C. section 360bbb-3(b)(1), unless the authorization is terminated or revoked sooner.  Performed at [facility], [place] [address], [place], [place] [address]     Time coordinating discharge: Approximately 40 minutes  Patrecia Pour, MD  [facility] 02/05/2021, 12:13 PM

## 2021-02-05 NOTE — TOC Progression Note (Signed)
Transition of Care [facility]) - Progression Note    Patient Details  Name: Nancy Brooks MRN: [id number] Date of Birth: 1950-07-12  Transition of Care [facility]) CM/SW Contact  Leeroy Cha, RN Phone Number: 02/05/2021, 11:38 AM  Clinical Narrative:    PassaR NUMBER STILL PENDING    Expected Discharge Plan: [place] Barriers to Discharge: [place] [facility])  Expected Discharge Plan and Services Expected Discharge Plan: [place] In-house Referral: Clinical Social Work   Post Acute Care Choice: [place] Living arrangements for the past 2 months: Single Family Home Expected Discharge Date: 02/04/21                                     Social Determinants of Health (SDOH) Interventions    Readmission Risk Interventions No flowsheet data found.

## 2021-02-06 LAB — RESP PANEL BY RT-PCR (FLU A&B, COVID) ARPGX2
Influenza A by PCR: NEGATIVE
Influenza B by PCR: NEGATIVE
SARS Coronavirus 2 by RT PCR: NEGATIVE

## 2021-02-06 NOTE — TOC Transition Note (Signed)
Transition of Care [facility]) - CM/SW Discharge Note   Patient Details  Name: Nancy Brooks MRN: [id number] Date of Birth: 1950/07/13  [facility] of [facility]) CM/SW Contact:  Lennart Pall, LCSW Phone Number: 02/06/2021, 12:19 PM   Clinical Narrative:    Pt medically cleared for dc/ PASRR auth and ins auth received.  [facility] SNF can admit pt today and pt/ sister aware and agreeable.  PTAR called at 12:20pm.  RN to call report to [phone].  No further TOC needs.   Final next level of care: Skilled Nursing Facility Barriers to Discharge: Barriers Resolved   Patient Goals and CMS Choice   CMS Medicare.gov Compare Post Acute Care list provided to:: Patient Choice offered to / list presented to : Patient  Discharge Placement PASRR number recieved: 02/05/21            Patient [facility] bed at: [facility] Patient to be transferred to facility by: [place] Name of family member notified: sister, [name] Patient and family notified of of transfer: 02/06/21  Discharge Plan and Services In-house Referral: Clinical Social Work   Post Acute Care Choice: [place]                               Social Determinants of Health (SDOH) Interventions     Readmission Risk Interventions No flowsheet data found.

## 2021-02-06 NOTE — NC FL2 (Signed)
[place] LEVEL OF CARE SCREENING TOOL     IDENTIFICATION  Patient Name: Nancy Brooks Birthdate: 11-11-1950 Sex: female Admission Date (Current Location): 01/29/2021  [facility] and [place] Number:  Herbalist and Address:  [facility],  [place], [place]      Provider Number: [id number]  Attending Physician Name and Address:  Patrecia Pour, MD  Relative Name and Phone Number:  [name] (sister) Ph: [phone]    Current Level of Care: Hospital Recommended Level of Care: [place] Prior Approval Number:    Date Approved/Denied:   PASRR Number: [id number] F  Discharge Plan: SNF    Current Diagnoses: Patient Active Problem List   Diagnosis Date Noted   Depression 02/02/2021   Anxiety 02/02/2021   Closed displaced fracture of metatarsal bone of left foot    Lisfranc dislocation 01/29/2021   Chills    Constipation 12/11/2020   Foot fracture, left 12/10/2020   Dislocation of tarsometatarsal joint of left foot 11/30/2020   Incontinence of feces 03/12/2019   B12 deficiency 12/01/2017   Routine general medical examination at a health care facility 05/22/2015   Senile osteoporosis 10/05/2011   Hyperlipidemia 01/20/2009   GERD 01/20/2009   Schizophrenia, unspecified type ([place]) 07/06/2006    Orientation RESPIRATION BLADDER Height & Weight     Self, Situation, Place  Normal Incontinent Weight: 120 lb (54.4 kg) Height:  5' (152.4 cm)  BEHAVIORAL SYMPTOMS/MOOD NEUROLOGICAL BOWEL NUTRITION STATUS      Continent Diet (Regular diet)  AMBULATORY STATUS COMMUNICATION OF NEEDS Skin   Limited Assist Verbally Surgical wounds                       Personal Care Assistance Level of Assistance  Bathing, Feeding, Dressing Bathing Assistance: Limited assistance Feeding assistance: Independent Dressing Assistance: Maximum assistance     Functional Limitations Info  Sight, Hearing, Speech Sight  Info: Adequate Hearing Info: Adequate Speech Info: Adequate    SPECIAL CARE FACTORS FREQUENCY  PT (By licensed PT), OT (By licensed OT)     PT Frequency: 5x's/week OT Frequency: 5x's/week            Contractures Contractures Info: Not present    Additional Factors Info  Code Status, Allergies, Psychotropic Code Status Info: Full Allergies Info: NKA Psychotropic Info: Librium (chlordiazepoxide), Clozaril (clozapine)         Current Medications (02/06/2021):  This is the current hospital active medication list Current Facility-Administered Medications  Medication Dose Route Frequency Provider Last Rate Last Admin   0.9 %  sodium chloride infusion  250 mL Intravenous PRN Wouk, Ailene Rud, MD       aspirin tablet 325 mg  325 mg Oral BID [name], DPM   325 mg at 02/06/21 5329   chlordiazePOXIDE (LIBRIUM) capsule 10 mg  10 mg Oral BID Gwynne Edinger, MD   10 mg at 02/06/21 9242   cloZAPine (CLOZARIL) tablet 100 mg  100 mg Oral Daily Gwynne Edinger, MD   100 mg at 02/06/21 6834   cloZAPine (CLOZARIL) tablet 400 mg  400 mg Oral QHS Gwynne Edinger, MD   400 mg at 02/05/21 2141   famotidine (PEPCID) tablet 10 mg  10 mg Oral Daily Gwynne Edinger, MD   10 mg at 02/06/21 0806   feeding supplement (ENSURE ENLIVE / ENSURE PLUS) liquid 237 mL  237 mL Oral BID BM Terrilee Croak, MD  237 mL at 02/06/21 0808   fentaNYL (SUBLIMAZE) injection 50 mcg  50 mcg Intravenous UD Wouk, Ailene Rud, MD   50 mcg at 01/29/21 1432   ibuprofen (ADVIL) tablet 600 mg  600 mg Oral Q6H PRN Gwynne Edinger, MD   600 mg at 02/04/21 2135   lactated ringers infusion   Intravenous Continuous Wouk, Ailene Rud, MD 10 mL/hr at 01/29/21 1625 New Bag at 01/29/21 1707   linaclotide (LINZESS) capsule 72 mcg  72 mcg Oral Daily Gwynne Edinger, MD   72 mcg at 02/06/21 2836   midazolam (VERSED) injection 1-2 mg  1-2 mg Intravenous UD Wouk, Ailene Rud, MD       multivitamin with minerals  tablet 1 tablet  1 tablet Oral Daily Dahal, Marlowe Aschoff, MD   1 tablet at 02/06/21 6294   oxyCODONE (Oxy IR/ROXICODONE) immediate release tablet 5 mg  5 mg Oral Q6H PRN Gwynne Edinger, MD   5 mg at 02/03/21 1716   pantoprazole (PROTONIX) EC tablet 40 mg  40 mg Oral Daily Dahal, Marlowe Aschoff, MD   40 mg at 02/06/21 0807   polyethylene glycol (MIRALAX / GLYCOLAX) packet 17 g  17 g Oral Daily Gwynne Edinger, MD   17 g at 02/05/21 7654   senna (SENOKOT) tablet 8.6 mg  1 tablet Oral QHS Gwynne Edinger, MD   8.6 mg at 02/04/21 2135   simvastatin (ZOCOR) tablet 40 mg  40 mg Oral q1800 Gwynne Edinger, MD   40 mg at 02/05/21 1743   sodium chloride flush (NS) 0.9 % injection 3 mL  3 mL Intravenous Q12H Gwynne Edinger, MD   3 mL at 02/06/21 6503   sodium chloride flush (NS) 0.9 % injection 3 mL  3 mL Intravenous PRN Wouk, Ailene Rud, MD         Discharge Medications: Please see discharge summary for a list of discharge medications.  Relevant Imaging Results:  Relevant Lab Results:   Additional Information SSN: [id number]  Lennart Pall, LCSW

## 2021-02-06 NOTE — Plan of Care (Signed)
Discharge instructions and report were given to facility. All questions were answered. Patient was transported to facility by PTAR. ? ?

## 2021-02-06 NOTE — Plan of Care (Signed)
Plan of care reviewed. 

## 2021-02-08 ENCOUNTER — Encounter: Payer: Self-pay | Admitting: Podiatry

## 2021-02-08 ENCOUNTER — Encounter (HOSPITAL_COMMUNITY): Payer: Self-pay | Admitting: Podiatry

## 2021-02-08 DIAGNOSIS — R600 Localized edema: Secondary | ICD-10-CM

## 2021-02-08 DIAGNOSIS — S92302D Fracture of unspecified metatarsal bone(s), left foot, subsequent encounter for fracture with routine healing: Secondary | ICD-10-CM

## 2021-02-10 NOTE — Telephone Encounter (Signed)
Did you want me to call her sister or have you reviewed anything?

## 2021-02-11 ENCOUNTER — Ambulatory Visit (INDEPENDENT_AMBULATORY_CARE_PROVIDER_SITE_OTHER): Payer: Medicare Other | Admitting: Podiatry

## 2021-02-11 ENCOUNTER — Ambulatory Visit (INDEPENDENT_AMBULATORY_CARE_PROVIDER_SITE_OTHER): Payer: Medicare Other

## 2021-02-11 ENCOUNTER — Other Ambulatory Visit: Payer: Self-pay

## 2021-02-11 DIAGNOSIS — S92302D Fracture of unspecified metatarsal bone(s), left foot, subsequent encounter for fracture with routine healing: Secondary | ICD-10-CM

## 2021-02-11 DIAGNOSIS — Z9889 Other specified postprocedural states: Secondary | ICD-10-CM

## 2021-02-11 NOTE — Patient Instructions (Signed)
Continue physical therapy for non weightbearing in the boot  Keep the boot on at all times. Can remove boot and ACE wrap to bathe. No soaking foot or scrubbing but can shower and sponge bathe.  Can apply moisturizing lotion to foot, don't put directly on the incision/sutures

## 2021-02-12 ENCOUNTER — Telehealth: Payer: Self-pay | Admitting: Psychiatry

## 2021-02-12 ENCOUNTER — Other Ambulatory Visit: Payer: Self-pay | Admitting: Psychiatry

## 2021-02-12 ENCOUNTER — Other Ambulatory Visit: Payer: Self-pay | Admitting: *Deleted

## 2021-02-12 MED ORDER — CHLORDIAZEPOXIDE HCL 10 MG PO CAPS: 10.0000 mg | ORAL_CAPSULE | Freq: Every evening | ORAL | 1 refills | Status: DC

## 2021-02-12 NOTE — Telephone Encounter (Signed)
I printed it off.  Please bring it for me to sign and then fax it.

## 2021-02-12 NOTE — Telephone Encounter (Signed)
Please review

## 2021-02-12 NOTE — Telephone Encounter (Signed)
Pt's sister [name] called reporting Nicole in [place] rehab after foot surgery. Hospital D/C  Librium after 3 days. Pt has always taken this in the morning and @ bedtime to help with sleep. Pt is not sleeping. Will you send order to [facility] fax # [phone] attn: Barbie Banner nurse director for Librium 10 mg?  Apt 10/25

## 2021-02-12 NOTE — Telephone Encounter (Signed)
faxed

## 2021-02-14 ENCOUNTER — Other Ambulatory Visit: Payer: Self-pay | Admitting: Internal Medicine

## 2021-02-15 NOTE — Progress Notes (Signed)
  Subjective:  Patient ID: Nancy Brooks, female    DOB: 1951-02-22,  MRN: [id number]  Chief Complaint  Patient presents with   Routine Post Op    POV #1 DOS 01/29/2021 TARSOMETATARSAL ARTHRODESIS LT     70 y.o. female returns for post-op check.  Doing well her pain is improving  Review of Systems: Negative except as noted in the HPI. Denies N/V/F/Ch.   Objective:  There were no vitals filed for this visit. There is no height or weight on file to calculate BMI. Constitutional Well developed. Well nourished.  Vascular Foot warm and well perfused. Capillary refill normal to all digits.   Neurologic Normal speech. Oriented to person, place, and time. Epicritic sensation to light touch grossly present bilaterally.  Dermatologic Skin healing well without signs of infection. Skin edges well coapted without signs of infection.  Orthopedic: Tenderness to palpation noted about the surgical site.  Minimal edema   Multiple view plain film radiographs: Tarsometatarsal arthrodesis with calcaneal bone graft hardware intact in good position and consistent with immediate postop films Assessment:   1. Closed fracture of base of metatarsal bone with routine healing, left    Plan:  Patient was evaluated and treated and all questions answered.  S/p foot surgery left -Progressing as expected post-operatively. -XR: As above -WB Status: NWB in CAM boot -Sutures: We will remove next visit. -Medications: No refills required -Foot redressed.  Dressing may be removed to bathe.  Only needs Ace wrap at this point  Return in about 1 week (around 02/18/2021) for suture removal, post op (no x-rays).

## 2021-02-15 NOTE — Telephone Encounter (Signed)
See other phone messages.

## 2021-02-15 NOTE — Telephone Encounter (Signed)
[name], pt's sister called today asking about the order for Librium. I believe the wrong fax # was given.  I faxed order to [phone] Attn: Barbie Banner P. [phone]

## 2021-02-17 ENCOUNTER — Telehealth: Payer: Self-pay | Admitting: *Deleted

## 2021-02-17 NOTE — Telephone Encounter (Signed)
-----   Message from [name], DPM sent at 02/10/2021  8:47 AM EDT ----- Regarding: Home PT Can you send a referral to [facility] for PT for this patient? Her sister is bringing her home from [place]. Kim with [id number] # is [phone]

## 2021-02-17 NOTE — Telephone Encounter (Signed)
Called [facility] (Kimberly)for the status of patient's referral and they have received it but Ritta Slot will not release patient to come home because of an Covid outbreak and family would have to give notice before releasing patient.

## 2021-02-18 ENCOUNTER — Other Ambulatory Visit: Payer: Self-pay

## 2021-02-18 ENCOUNTER — Ambulatory Visit (INDEPENDENT_AMBULATORY_CARE_PROVIDER_SITE_OTHER): Payer: Medicare Other | Admitting: Podiatry

## 2021-02-18 DIAGNOSIS — S92302D Fracture of unspecified metatarsal bone(s), left foot, subsequent encounter for fracture with routine healing: Secondary | ICD-10-CM

## 2021-02-18 DIAGNOSIS — Z9889 Other specified postprocedural states: Secondary | ICD-10-CM

## 2021-02-18 NOTE — Patient Instructions (Signed)
Continue no weight on the foot in the boot  Bathe the foot regularly. Wash, dry thoroughly and once dry apply the ACE wrap

## 2021-02-23 NOTE — Progress Notes (Signed)
  Subjective:  Patient ID: Nancy Brooks, female    DOB: 29-Dec-1950,  MRN: [id number]  Chief Complaint  Patient presents with   Routine Post Op    POV #2 DOS 01/29/2021 TARSOMETATARSAL ARTHRODESIS LT , pt hasnt been bearing any weight , some pain not much , swelling and some redness     70 y.o. female returns for post-op check.  Doing well her pain is improving  Review of Systems: Negative except as noted in the HPI. Denies N/V/F/Ch.   Objective:  There were no vitals filed for this visit. There is no height or weight on file to calculate BMI. Constitutional Well developed. Well nourished.  Vascular Foot warm and well perfused. Capillary refill normal to all digits.   Neurologic Normal speech. Oriented to person, place, and time. Epicritic sensation to light touch grossly present bilaterally.  Dermatologic Skin is wet as well as her CAM boot  Orthopedic: Tenderness to palpation noted about the surgical site.  Minimal edema   Multiple view plain film radiographs: Tarsometatarsal arthrodesis with calcaneal bone graft hardware intact in good position and consistent with immediate postop films Assessment:   1. Closed fracture of base of metatarsal bone with routine healing, left   2. Post-operative state     Plan:  Patient was evaluated and treated and all questions answered.  S/p foot surgery left -Sutures removed today I wrote orders for her to have regular bathing at this point and dry the foot appropriately it was wet on arrival and gotten wet inside the boot I think the cast bag had leaked.  Dispensed a new boot today that was dry and clean.  Follow-up in 5 weeks for new x-rays and hopeful transition to weightbearing at that point  Return in about 5 weeks (around 03/25/2021) for post op (new x-rays).

## 2021-03-01 ENCOUNTER — Telehealth: Payer: Self-pay | Admitting: *Deleted

## 2021-03-01 ENCOUNTER — Ambulatory Visit: Payer: Medicare Other | Admitting: Internal Medicine

## 2021-03-01 NOTE — Telephone Encounter (Signed)
Patient's sister(Janet) is calling to request a referral to [facility]. She said that the other company([facility] will not be able to come out until next week. Called [place] and they have the same time frame(next week), informed the sister of this but she still wants to go with them. Please advise.

## 2021-03-01 NOTE — Telephone Encounter (Signed)
Yes that's fine to send referral

## 2021-03-02 ENCOUNTER — Ambulatory Visit (INDEPENDENT_AMBULATORY_CARE_PROVIDER_SITE_OTHER): Payer: Medicare Other | Admitting: Psychiatry

## 2021-03-02 ENCOUNTER — Other Ambulatory Visit: Payer: Self-pay

## 2021-03-02 ENCOUNTER — Encounter: Payer: Self-pay | Admitting: Psychiatry

## 2021-03-02 DIAGNOSIS — Z79899 Other long term (current) drug therapy: Secondary | ICD-10-CM | POA: Diagnosis not present

## 2021-03-02 DIAGNOSIS — K5903 Drug induced constipation: Secondary | ICD-10-CM | POA: Diagnosis not present

## 2021-03-02 DIAGNOSIS — F411 Generalized anxiety disorder: Secondary | ICD-10-CM | POA: Diagnosis not present

## 2021-03-02 DIAGNOSIS — F200 Paranoid schizophrenia: Secondary | ICD-10-CM | POA: Diagnosis not present

## 2021-03-02 MED ORDER — CHLORDIAZEPOXIDE HCL 10 MG PO CAPS: ORAL_CAPSULE | ORAL | 5 refills | Status: DC

## 2021-03-02 NOTE — Telephone Encounter (Signed)
Faxed referral to [facility] per patient's sister's request, received confirmation-03/02/21
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
## 2021-03-11 ENCOUNTER — Encounter: Payer: Medicare Other | Admitting: Podiatry

## 2021-03-18 ENCOUNTER — Other Ambulatory Visit: Payer: Self-pay | Admitting: Psychiatry

## 2021-03-18 ENCOUNTER — Telehealth: Payer: Self-pay

## 2021-03-18 DIAGNOSIS — Z79899 Other long term (current) drug therapy: Secondary | ICD-10-CM

## 2021-03-18 DIAGNOSIS — F200 Paranoid schizophrenia: Secondary | ICD-10-CM

## 2021-03-18 NOTE — Telephone Encounter (Signed)
Nancy Brooks, pt's sister requesting a lab order for her Clozapine/CBC and writing standing order X 6 months 03/18/2021-09/16/2021 on her order be faxed to her at [phone].  Order printed, signed and faxed.    Pt is due for her CBC now and she is going on Monday 03/22/2021

## 2021-03-19 ENCOUNTER — Telehealth: Payer: Self-pay | Admitting: *Deleted

## 2021-03-19 NOTE — Telephone Encounter (Signed)
[facility]([phone])is calling to make doctor aware that the patient had a fall last night trying to use the bedside commode, fell on her bottom, not sure if she hit left leg or not, pain level is now 3-4,usually does not complain of pain, was wearing her boot at time of fall. Please advise.

## 2021-03-23 NOTE — Telephone Encounter (Signed)
Called and left vmessage concerning patient's fall and giving physician's information about upcoming appointment.

## 2021-03-24 ENCOUNTER — Encounter: Payer: Self-pay | Admitting: Gastroenterology

## 2021-03-25 ENCOUNTER — Ambulatory Visit (INDEPENDENT_AMBULATORY_CARE_PROVIDER_SITE_OTHER): Payer: Medicare Other

## 2021-03-25 ENCOUNTER — Other Ambulatory Visit: Payer: Self-pay

## 2021-03-25 ENCOUNTER — Ambulatory Visit (INDEPENDENT_AMBULATORY_CARE_PROVIDER_SITE_OTHER): Payer: Medicare Other | Admitting: Podiatry

## 2021-03-25 ENCOUNTER — Encounter: Payer: Self-pay | Admitting: Psychiatry

## 2021-03-25 DIAGNOSIS — S92302D Fracture of unspecified metatarsal bone(s), left foot, subsequent encounter for fracture with routine healing: Secondary | ICD-10-CM

## 2021-03-25 NOTE — Patient Instructions (Signed)
Over the next 2 weeks, increase the amount you are in a shoe/sneaker by about 20-30 minutes a day. I hope that in 2 weeks you can be in a shoe most of the day. If the foot hurts too much doing an activity stick with the boot (or if taking ibuprofen more than once a day). No impactful activity or exercise (running / jumping)  Work on walking in a shoe with physical therapy gradually.

## 2021-03-29 ENCOUNTER — Telehealth: Payer: Self-pay | Admitting: *Deleted

## 2021-03-29 DIAGNOSIS — S92302D Fracture of unspecified metatarsal bone(s), left foot, subsequent encounter for fracture with routine healing: Secondary | ICD-10-CM

## 2021-03-29 NOTE — Telephone Encounter (Signed)
Cherise w/ [facility] also wanted to request an OT evaluation now that patient is moving around and on feet for more independence at home and taking showers.

## 2021-03-29 NOTE — Telephone Encounter (Signed)
Faxed the OT order to [facility], received confirmation-03/29/21.

## 2021-03-29 NOTE — Progress Notes (Signed)
  Subjective:  Patient ID: Nancy Brooks, female    DOB: Jul 18, 1950,  MRN: [id number]  Chief Complaint  Patient presents with   Fracture      (xray)POV #3 DOS 01/29/2021 TARSOMETATARSAL ARTHRODESIS LT     70 y.o. female returns for post-op check.  Doing well her pain is improving, swelling is coming down quite a bit  Review of Systems: Negative except as noted in the HPI. Denies N/V/F/Ch.   Objective:  There were no vitals filed for this visit. There is no height or weight on file to calculate BMI. Constitutional Well developed. Well nourished.  Vascular Foot warm and well perfused. Capillary refill normal to all digits.   Neurologic Normal speech. Oriented to person, place, and time. Epicritic sensation to light touch grossly present bilaterally.  Dermatologic Skin is wet as well as her CAM boot  Orthopedic: Tenderness to palpation noted about the surgical site.  Minimal edema   Multiple view plain film radiographs: New films taken today show increasing consolidation of arthrodesis sites Assessment:   1. Closed fracture of base of metatarsal bone with routine healing, left     Plan:  Patient was evaluated and treated and all questions answered.  S/p foot surgery left -Overall doing well she may begin to transition to weightbearing in the boot.  She can begin physical therapy for conditioning as well.  Hopefully transition to shoe by next visit.  Advised to let me know if there is any significant increases in pain swelling or other issues.  Return in about 4 weeks (around 04/22/2021) for fracture follow up in 4 weeks (new xrays) .

## 2021-03-29 NOTE — Telephone Encounter (Signed)
Returned the call to PT(Cherise), no answer,left vmessage for a verbal orders for exercises and recommendations for OT.

## 2021-03-29 NOTE — Telephone Encounter (Signed)
Physical Therapy w/ [facility] is calling for clarification on exercises for patient , should there be range of motion and strengthening for the left foot? She is also wanting to let the doctor know that cam boot that patient missing a strap, and soft part of the boot is coming out , not attached hard part causing a fall risk. She wanted patient's next upcoming appointment.Please advise. Returned the call to PT,no answer, left vmessage that patient may return the boot for exchange and her next appointment date and time.

## 2021-03-29 NOTE — Telephone Encounter (Signed)
Exercises should include range of motion, strengthening and conditioning of the left foot ankle and leg. Boot exchange is fine at next appt

## 2021-04-05 ENCOUNTER — Telehealth: Payer: Self-pay | Admitting: Podiatry

## 2021-04-05 NOTE — Telephone Encounter (Signed)
I called will back from [facility] and he stated he will have [facility] fax [place] the form.

## 2021-04-05 NOTE — Telephone Encounter (Signed)
Will from [facility] called to request prior authorization for OT for 2 weeks .   Please advise.Marland KitchenMarland Kitchen

## 2021-04-07 ENCOUNTER — Telehealth: Payer: Self-pay | Admitting: *Deleted

## 2021-04-07 NOTE — Telephone Encounter (Addendum)
PT with [facility] [phone]) is calling to clarify weight bearing precautions for patient allowed, if they can do active range of motion,strengthening on ankle. Please advise. Returned the call to [facility] answer and left vmessage of Dr Maxie Barb recommendations.

## 2021-04-08 ENCOUNTER — Encounter: Payer: Self-pay | Admitting: Podiatry

## 2021-04-15 ENCOUNTER — Encounter: Payer: Medicare Other | Admitting: Gastroenterology

## 2021-04-22 ENCOUNTER — Encounter: Payer: Medicare Other | Admitting: Podiatry

## 2021-04-26 ENCOUNTER — Telehealth: Payer: Self-pay | Admitting: *Deleted

## 2021-04-26 NOTE — Telephone Encounter (Signed)
[facility] PT calling to let the doctor know that patient fell today @ 7:30 am trying to get from the bedroom to living room, has an appointment tomorrow, pain level is 2 out 10.Please advise

## 2021-04-26 NOTE — Telephone Encounter (Signed)
We'll xray her tomorrow and evaluate

## 2021-04-26 NOTE — Telephone Encounter (Signed)
Called and spoke with patient's sister giving information concerning patient's fall.  I also called and left vmessage w/ [facility] PT@ [phone].

## 2021-04-27 ENCOUNTER — Other Ambulatory Visit: Payer: Self-pay | Admitting: Internal Medicine

## 2021-04-27 ENCOUNTER — Other Ambulatory Visit: Payer: Self-pay

## 2021-04-27 ENCOUNTER — Ambulatory Visit (INDEPENDENT_AMBULATORY_CARE_PROVIDER_SITE_OTHER): Payer: Medicare Other

## 2021-04-27 ENCOUNTER — Ambulatory Visit (INDEPENDENT_AMBULATORY_CARE_PROVIDER_SITE_OTHER): Payer: Medicare Other | Admitting: Podiatry

## 2021-04-27 DIAGNOSIS — S92302D Fracture of unspecified metatarsal bone(s), left foot, subsequent encounter for fracture with routine healing: Secondary | ICD-10-CM

## 2021-04-27 NOTE — Progress Notes (Signed)
°  Subjective:  Patient ID: Nancy Brooks, female    DOB: May 21, 1950,  MRN: [id number]  Chief Complaint  Patient presents with   Fracture    post op  fracture follow up in 4 weeks (new xrays)     70 y.o. female returns for post-op check.  She is doing very well PT has completed at this point.  She fell at home but is not having any pain from it  Review of Systems: Negative except as noted in the HPI. Denies N/V/F/Ch.   Objective:  There were no vitals filed for this visit. There is no height or weight on file to calculate BMI. Constitutional Well developed. Well nourished.  Vascular Foot warm and well perfused. Capillary refill normal to all digits.   Neurologic Normal speech. Oriented to person, place, and time. Epicritic sensation to light touch grossly present bilaterally.  Dermatologic Incisions are well-healed there are some suture strands remaining  Orthopedic: She has 5 out of 5 strength in dorsiflexion plantarflexion inversion and eversion.  She has minimal to no tenderness to palpation noted about the surgical site.  Minimal edema   Multiple view plain film radiographs: New films taken today show consolidation across the arthrodesis sites Assessment:   1. Closed fracture of base of metatarsal bone with routine healing, left     Plan:  Patient was evaluated and treated and all questions answered.  S/p foot surgery left -She is doing very well and is having no pain some residual swelling.  Advised that this may continue for another couple months.  She has good healing and consolidation across her arthrodesis sites.  No pain on the hardware.  She return to see me as needed and no further restrictions for her at this point.  Return if symptoms worsen or fail to improve.

## 2021-04-29 MED ORDER — SIMVASTATIN 40 MG PO TABS: 40.0000 mg | ORAL_TABLET | Freq: Every day | ORAL | 0 refills | Status: DC

## 2021-04-29 NOTE — Telephone Encounter (Signed)
Patient calling in  Requesting short refill until CPE visit 01.12.23

## 2021-05-11 ENCOUNTER — Encounter: Payer: Self-pay | Admitting: Psychiatry

## 2021-05-11 ENCOUNTER — Other Ambulatory Visit: Payer: Self-pay

## 2021-05-11 ENCOUNTER — Ambulatory Visit: Payer: Medicare Other | Admitting: Psychiatry

## 2021-05-11 DIAGNOSIS — F200 Paranoid schizophrenia: Secondary | ICD-10-CM | POA: Diagnosis not present

## 2021-05-11 DIAGNOSIS — F411 Generalized anxiety disorder: Secondary | ICD-10-CM | POA: Diagnosis not present

## 2021-05-11 DIAGNOSIS — Z79899 Other long term (current) drug therapy: Secondary | ICD-10-CM

## 2021-05-11 DIAGNOSIS — K5903 Drug induced constipation: Secondary | ICD-10-CM | POA: Diagnosis not present
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
## 2021-05-14 ENCOUNTER — Ambulatory Visit: Payer: Medicare Other

## 2021-05-17 ENCOUNTER — Other Ambulatory Visit: Payer: Self-pay

## 2021-05-17 ENCOUNTER — Ambulatory Visit (AMBULATORY_SURGERY_CENTER): Payer: Medicare Other | Admitting: *Deleted

## 2021-05-17 VITALS — Ht 61.0 in | Wt 114.0 lb

## 2021-05-17 DIAGNOSIS — Z86010 Personal history of colonic polyps: Secondary | ICD-10-CM

## 2021-05-17 NOTE — Progress Notes (Signed)
Patient's pre-visit was done today over the phone with the patient. Name,DOB and address verified. Patient denies any allergies to Eggs and Soy. Patient denies any problems with anesthesia/sedation. Patient is not taking any diet pills or blood thinners. No home Oxygen.  Prep instructions sent to pt's MyChart (if activated).Patient understands to call us back with any questions or concerns. Patient is aware of our care-partner policy and [id number] safety protocol.   EMMI education assigned to the patient for the procedure, sent to [place].   The patient is COVID-19 vaccinated.

## 2021-05-20 ENCOUNTER — Encounter: Payer: Self-pay | Admitting: Psychiatry

## 2021-05-20 ENCOUNTER — Encounter: Payer: Medicare Other | Admitting: Internal Medicine

## 2021-05-24 ENCOUNTER — Ambulatory Visit (INDEPENDENT_AMBULATORY_CARE_PROVIDER_SITE_OTHER): Payer: Medicare Other

## 2021-05-24 ENCOUNTER — Other Ambulatory Visit: Payer: Self-pay

## 2021-05-24 DIAGNOSIS — Z0000 Encounter for general adult medical examination without abnormal findings: Secondary | ICD-10-CM | POA: Diagnosis not present

## 2021-05-24 NOTE — Patient Instructions (Signed)
Nancy Brooks , Thank you for taking time to come for your Medicare Wellness Visit. I appreciate your ongoing commitment to your health goals. Please review the following plan we discussed and let me know if I can assist you in the future.   Screening recommendations/referrals: Colonoscopy: scheduled 05/31/2021 Mammogram: patient will schedule Bone Density: 07/03/2018 Recommended yearly ophthalmology/optometry visit for glaucoma screening and checkup Recommended yearly dental visit for hygiene and checkup  Vaccinations: Influenza vaccine: completed  Pneumococcal vaccine: completed  Tdap vaccine: 08/11/2014 Shingles vaccine: completed     Advanced directives: yes   Conditions/risks identified: none   Next appointment: none    Preventive Care 69 Years and Older, Female Preventive care refers to lifestyle choices and visits with your health care provider that can promote health and wellness. What does preventive care include? A yearly physical exam. This is also called an annual well check. Dental exams once or twice a year. Routine eye exams. Ask your health care provider how often you should have your eyes checked. Personal lifestyle choices, including: Daily care of your teeth and gums. Regular physical activity. Eating a healthy diet. Avoiding tobacco and drug use. Limiting alcohol use. Practicing safe sex. Taking low-dose aspirin every day. Taking vitamin and mineral supplements as recommended by your health care provider. What happens during an annual well check? The services and screenings done by your health care provider during your annual well check will depend on your age, overall health, lifestyle risk factors, and family history of disease. Counseling  Your health care provider may ask you questions about your: Alcohol use. Tobacco use. Drug use. Emotional well-being. Home and relationship well-being. Sexual activity. Eating habits. History of falls. Memory  and ability to understand (cognition). Work and work Statistician. Reproductive health. Screening  You may have the following tests or measurements: Height, weight, and BMI. Blood pressure. Lipid and cholesterol levels. These may be checked every 5 years, or more frequently if you are over 69 years old. Skin check. Lung cancer screening. You may have this screening every year starting at age 69 if you have a 30-pack-year history of smoking and currently smoke or have quit within the past 15 years. Fecal occult blood test (FOBT) of the stool. You may have this test every year starting at age 69. Flexible sigmoidoscopy or colonoscopy. You may have a sigmoidoscopy every 5 years or a colonoscopy every 10 years starting at age 69. Hepatitis C blood test. Hepatitis B blood test. Sexually transmitted disease (STD) testing. Diabetes screening. This is done by checking your blood sugar (glucose) after you have not eaten for a while (fasting). You may have this done every 1-3 years. Bone density scan. This is done to screen for osteoporosis. You may have this done starting at age 69. Mammogram. This may be done every 1-2 years. Talk to your health care provider about how often you should have regular mammograms. Talk with your health care provider about your test results, treatment options, and if necessary, the need for more tests. Vaccines  Your health care provider may recommend certain vaccines, such as: Influenza vaccine. This is recommended every year. Tetanus, diphtheria, and acellular pertussis (Tdap, Td) vaccine. You may need a Td booster every 10 years. Zoster vaccine. You may need this after age 69. Pneumococcal 13-valent conjugate (PCV13) vaccine. One dose is recommended after age 69. Pneumococcal polysaccharide (PPSV23) vaccine. One dose is recommended after age 69. Talk to your health care provider about which screenings and vaccines you need and  how often you need them. This  information is not intended to replace advice given to you by your health care provider. Make sure you discuss any questions you have with your health care provider. Document Released: 05/22/2015 Document Revised: 01/13/2016 Document Reviewed: 02/24/2015 Elsevier Interactive Patient Education  2017 [place] Prevention in the Home Falls can cause injuries. They can happen to people of all ages. There are many things you can do to make your home safe and to help prevent falls. What can I do on the outside of my home? Regularly fix the edges of walkways and driveways and fix any cracks. Remove anything that might make you trip as you walk through a door, such as a raised step or threshold. Trim any bushes or trees on the path to your home. Use bright outdoor lighting. Clear any walking paths of anything that might make someone trip, such as rocks or tools. Regularly check to see if handrails are loose or broken. Make sure that both sides of any steps have handrails. Any raised decks and porches should have guardrails on the edges. Have any leaves, snow, or ice cleared regularly. Use sand or salt on walking paths during winter. Clean up any spills in your garage right away. This includes oil or grease spills. What can I do in the bathroom? Use night lights. Install grab bars by the toilet and in the tub and shower. Do not use towel bars as grab bars. Use non-skid mats or decals in the tub or shower. If you need to sit down in the shower, use a plastic, non-slip stool. Keep the floor dry. Clean up any water that spills on the floor as soon as it happens. Remove soap buildup in the tub or shower regularly. Attach bath mats securely with double-sided non-slip rug tape. Do not have throw rugs and other things on the floor that can make you trip. What can I do in the bedroom? Use night lights. Make sure that you have a light by your bed that is easy to reach. Do not use any sheets or  blankets that are too big for your bed. They should not hang down onto the floor. Have a firm chair that has side arms. You can use this for support while you get dressed. Do not have throw rugs and other things on the floor that can make you trip. What can I do in the kitchen? Clean up any spills right away. Avoid walking on wet floors. Keep items that you use a lot in easy-to-reach places. If you need to reach something above you, use a strong step stool that has a grab bar. Keep electrical cords out of the way. Do not use floor polish or wax that makes floors slippery. If you must use wax, use non-skid floor wax. Do not have throw rugs and other things on the floor that can make you trip. What can I do with my stairs? Do not leave any items on the stairs. Make sure that there are handrails on both sides of the stairs and use them. Fix handrails that are broken or loose. Make sure that handrails are as long as the stairways. Check any carpeting to make sure that it is firmly attached to the stairs. Fix any carpet that is loose or worn. Avoid having throw rugs at the top or bottom of the stairs. If you do have throw rugs, attach them to the floor with carpet tape. Make sure that you have  a light switch at the top of the stairs and the bottom of the stairs. If you do not have them, ask someone to add them for you. What else can I do to help prevent falls? Wear shoes that: Do not have high heels. Have rubber bottoms. Are comfortable and fit you well. Are closed at the toe. Do not wear sandals. If you use a stepladder: Make sure that it is fully opened. Do not climb a closed stepladder. Make sure that both sides of the stepladder are locked into place. Ask someone to hold it for you, if possible. Clearly mark and make sure that you can see: Any grab bars or handrails. First and last steps. Where the edge of each step is. Use tools that help you move around (mobility aids) if they are  needed. These include: Canes. Walkers. Scooters. Crutches. Turn on the lights when you go into a dark area. Replace any light bulbs as soon as they burn out. Set up your furniture so you have a clear path. Avoid moving your furniture around. If any of your floors are uneven, fix them. If there are any pets around you, be aware of where they are. Review your medicines with your doctor. Some medicines can make you feel dizzy. This can increase your chance of falling. Ask your doctor what other things that you can do to help prevent falls. This information is not intended to replace advice given to you by your health care provider. Make sure you discuss any questions you have with your health care provider. Document Released: 02/19/2009 Document Revised: 10/01/2015 Document Reviewed: 05/30/2014 Elsevier Interactive Patient Education  2017 [employer].

## 2021-05-24 NOTE — Progress Notes (Signed)
Subjective:   Nancy Brooks is a 71 y.o. female who presents for Medicare Annual (Subsequent) preventive examination.  I connected with Lennon Alstrom today by telephone and verified that I am speaking with the correct person using two identifiers. Location patient: home Location provider: work Persons participating in the virtual visit: patient, provider.   I discussed the limitations, risks, security and privacy concerns of performing an evaluation and management service by telephone and the availability of in person appointments. I also discussed with the patient that there may be a patient responsible charge related to this service. The patient expressed understanding and verbally consented to this telephonic visit.    Interactive audio and video telecommunications were attempted between this provider and patient, however failed, due to patient having technical difficulties OR patient did not have access to video capability.  We continued and completed visit with audio only.    Review of Systems     Cardiac Risk Factors include: advanced age (>78men, >4 women)     Objective:    Today's Vitals   There is no height or weight on file to calculate BMI.  Advanced Directives 05/24/2021 01/29/2021 01/28/2021 12/10/2020 12/10/2020 09/23/2015  Does Patient Have a Medical Advance Directive? Yes Yes Yes No No Yes  Type of Paramedic of [place];Living will Healthcare Power of [place]  Does patient want to make changes to medical advance directive? - No - Patient declined No - Patient declined - - -  Copy of [place] in Chart? No - copy requested Yes - validated most recent copy scanned in chart (See row information) No - copy requested - - -  Would patient like information on creating a medical advance directive? - - - No - Patient declined No - Patient declined -    Current Medications  (verified) Outpatient Encounter Medications as of 05/24/2021  Medication Sig   acetaminophen (TYLENOL) 325 MG tablet Take 2 tablets (650 mg total) by mouth every 6 (six) hours as needed for mild pain.   calcium elemental as carbonate (BARIATRIC TUMS ULTRA) 400 MG chewable tablet Chew 1,000 mg by mouth daily.   chlordiazePOXIDE (LIBRIUM) 10 MG capsule 1 in the morning and 1 at bedtime   cholecalciferol (VITAMIN D3) 25 MCG (1000 UNIT) tablet Take 1,000 Units by mouth daily.   cloZAPine (CLOZARIL) 100 MG tablet TAKE 1 TABLET BY MOUTH EVERY MORNING AND 4 TABLETS EVERY EVENING   famotidine (PEPCID) 10 MG tablet Take 10 mg by mouth daily.    ibandronate (BONIVA) 150 MG tablet Take 150 mg by mouth every 30 (thirty) days. Take in the morning with a full glass of water, on an empty stomach, and do not take anything else by mouth or lie down for the next 30 min.   LINZESS 72 MCG capsule Take 72 mcg by mouth daily.   polyethylene glycol (MIRALAX / GLYCOLAX) 17 g packet Take 17 g by mouth daily.   simvastatin (ZOCOR) 40 MG tablet Take 1 tablet (40 mg total) by mouth daily at 6 PM. Due for physical at the end of November   vitamin B-12 (CYANOCOBALAMIN) 1000 MCG tablet Take 1 tablet (1,000 mcg total) by mouth daily.   No facility-administered encounter medications on file as of 05/24/2021.    Allergies (verified) Patient has no known allergies.   History: Past Medical History:  Diagnosis Date   Anxiety    BURSITIS, RIGHT KNEE  Depression    Dyskinesia of esophagus    GAIT DISTURBANCE    GERD    HEMORRHOIDS, NOS    HYPERLIPIDEMIA    MENOPAUSAL SYNDROME    Osteoporosis dx 03/2011   DEXA at gyn, started boniva in additon to Ca+D, changed to Prolia 09/2012   SCHIZOPHRENIA    SYMPTOM, INCONTINENCE, URGE    Past Surgical History:  Procedure Laterality Date   ARTHRODESIS METATARSAL Left 01/29/2021   Procedure: ARTHRODESIS TARSOMETATARSAL LEFT FOOT;  Surgeon: [name], DPM;  Location: [facility]  ORS;  Service: Podiatry;  Laterality: Left;  GENERAL WITH BLOCK   BREAST SURGERY  05/09/1998   left breast biopsy   CLOSED REDUCTION METATARSAL Left 12/11/2020   Procedure: CLOSED REDUCTION METATARSAL;  Surgeon: [name], DPM;  Location: [facility] ORS;  Service: Podiatry;  Laterality: Left;  reduction and pinning (2.4mm Steinmann pins)   COLONOSCOPY  03/16/2010   also 2017   Family History  Problem Relation Age of Onset   Hypertension Mother    Depression Mother    Prostate cancer Father    Colon cancer Brother 56   Esophageal cancer Neg Hx    Pancreatic cancer Neg Hx    Rectal cancer Neg Hx    Stomach cancer Neg Hx    Social History   Socioeconomic History   Marital status: Single    Spouse name: Not on file   Number of children: Not on file   Years of education: Not on file   Highest education level: Not on file  Occupational History   Not on file  Tobacco Use   Smoking status: Never   Smokeless tobacco: Never  Vaping Use   Vaping Use: Never used  Substance and Sexual Activity   Alcohol use: No    Alcohol/week: 0.0 standard drinks   Drug use: No   Sexual activity: Not on file  Other Topics Concern   Not on file  Social History Narrative   Lives alone in single family home. Sister provides support and come to visits. Pt drives and indep ADLs.    Social Determinants of Health   Financial Resource Strain: Low Risk    Difficulty of Paying Living Expenses: Not hard at all  Food Insecurity: No Food Insecurity   Worried About Charity fundraiser in the Last Year: Never true   [place] in the Last Year: Never true  Transportation Needs: No Transportation Needs   Lack of Transportation (Medical): No   Lack of Transportation (Non-Medical): No  Physical Activity: Insufficiently Active   Days of Exercise per Week: 5 days   Minutes of Exercise per Session: 20 min  Stress: No Stress Concern Present   Feeling of Stress : Not at all  Social Connections: Socially  Isolated   Frequency of Communication with Friends and Family: Twice a week   Frequency of Social Gatherings with Friends and Family: Twice a week   Attends Religious Services: Never   Marine scientist or Organizations: No   Attends Music therapist: Never   Marital Status: Never married    Tobacco Counseling Counseling given: Not Answered   Clinical Intake:  Pre-visit preparation completed: Yes  Pain : No/denies pain     Nutritional Risks: None Diabetes: No  How often do you need to have someone help you when you read instructions, pamphlets, or other written materials from your doctor or pharmacy?: 1 - Never What is the last grade level you  completed in school?: HS  Diabetic?no   Interpreter Needed?: No  Information entered by :: [place] of Daily Living In your present state of health, do you have any difficulty performing the following activities: 05/24/2021 01/30/2021  Hearing? N N  Vision? N N  Difficulty concentrating or making decisions? N Y  Walking or climbing stairs? N N  Dressing or bathing? N N  Doing errands, shopping? N N  Preparing Food and eating ? N -  Using the Toilet? N -  In the past six months, have you accidently leaked urine? N -  Do you have problems with loss of bowel control? N -  Managing your Medications? N -  Managing your Finances? N -  Housekeeping or managing your Housekeeping? N -  Some recent data might be hidden    Patient Care Team: Hoyt Koch, MD as PCP - General (Internal Medicine) Lafayette Dragon, MD (Inactive) as Consulting Physician (Gastroenterology) Cottle, Billey Co., MD as Consulting Physician (Psychiatry) Marylynn Pearson, MD (Obstetrics and Gynecology)  Indicate any recent Medical Services you may have received from other than [facility] providers in the past year (date may be approximate).     Assessment:   This is a routine wellness examination for  [place].  Hearing/Vision screen No results found.  Dietary issues and exercise activities discussed: Current Exercise Habits: Home exercise routine, Type of exercise: walking, Time (Minutes): 20, Frequency (Times/Week): 5, Weekly Exercise (Minutes/Week): 100, Intensity: Mild, Exercise limited by: None identified   Goals Addressed   None    Depression Screen PHQ 2/9 Scores 05/24/2021 05/24/2021 03/31/2020 03/12/2019 08/11/2014 07/17/2013  PHQ - 2 Score 0 0 2 0 0 2  PHQ- 9 Score - - - - - 6    Fall Risk Fall Risk  05/24/2021 03/31/2020 03/12/2019 08/11/2014 07/17/2013  Falls in the past year? 0 1 1 Yes No  Number falls in past yr: 0 1 1 - -  Injury with Fall? 0 0 0 - -  Risk for fall due to : - History of fall(s);Impaired balance/gait - Impaired mobility -  Follow up Falls evaluation completed Falls evaluation completed - - -    FALL RISK PREVENTION PERTAINING TO THE HOME:  Any stairs in or around the home? No  If so, are there any without handrails? No  Home free of loose throw rugs in walkways, pet beds, electrical cords, etc? Yes  Adequate lighting in your home to reduce risk of falls? Yes   ASSISTIVE DEVICES UTILIZED TO PREVENT FALLS:  Life alert? No  Use of a cane, walker or w/c? No  Grab bars in the bathroom? Yes  Shower chair or bench in shower? No  Elevated toilet seat or a handicapped toilet? No   Cognitive Function:  Normal cognitive status assessed by direct observation by this Nurse Health Advisor. No abnormalities found.        Immunizations Immunization History  Administered Date(s) Administered   Fluad Quad(high Dose 65+) 03/31/2020   Influenza Inj Mdck Quad Pf 03/12/2017   Influenza Split 02/02/2011, 03/03/2013   Influenza, High Dose Seasonal PF 02/03/2019   Influenza,inj,Quad PF,6+ Mos 02/24/2016   Influenza-Unspecified 03/22/2015, 03/12/2017, 02/25/2018, 02/21/2019   Moderna SARS-COV2 Booster Vaccination 08/19/2020   Moderna Sars-Covid-2 Vaccination  06/11/2019, 07/11/2019, 03/02/2020   Pneumococcal Conjugate-13 03/12/2019   Pneumococcal Polysaccharide-23 03/31/2020   Td 12/12/2003, 08/11/2014   Zoster Recombinat (Shingrix) 09/11/2016, 12/17/2016   Zoster, Live 10/23/2013    TDAP status: Up to  date  Flu Vaccine status: Up to date  Pneumococcal vaccine status: Up to date  Covid-19 vaccine status: Completed vaccines  Qualifies for Shingles Vaccine? Yes   Zostavax completed No   Shingrix Completed?: Yes  Screening Tests Health Maintenance  Topic Date Due   COLONOSCOPY (Pts 45-80yrs Insurance coverage will need to be confirmed)  10/07/2020   COVID-19 Vaccine (4 - Booster for Moderna series) 10/14/2020   INFLUENZA VACCINE  12/07/2020   MAMMOGRAM  04/14/2021   TETANUS/TDAP  08/10/2024   Pneumonia Vaccine 52+ Years old  Completed   DEXA SCAN  Completed   Hepatitis C Screening  Completed   Zoster Vaccines- Shingrix  Completed   HPV VACCINES  Aged Out    Health Maintenance  Health Maintenance Due  Topic Date Due   COLONOSCOPY (Pts 45-68yrs Insurance coverage will need to be confirmed)  10/07/2020   COVID-19 Vaccine (4 - Booster for Moderna series) 10/14/2020   INFLUENZA VACCINE  12/07/2020   MAMMOGRAM  04/14/2021    Colorectal cancer screening: Referral to GI placed scheduled 05/31/2021. Pt aware the office will call re: appt.  Mammogram status: Ordered 05/24/2021. Pt provided with contact info and advised to call to schedule appt.   Bone Density status: Completed 07/03/2018. Results reflect: Bone density results: OSTEOPENIA. Repeat every 5 years.  Lung Cancer Screening: (Low Dose CT Chest recommended if Age 39-80 years, 30 pack-year currently smoking OR have quit w/in 15years.) does not qualify.   Lung Cancer Screening Referral: n/a  Additional Screening:  Hepatitis C Screening: does not qualify; Completed 08/04/2016  Vision Screening: Recommended annual ophthalmology exams for early detection of glaucoma and  other disorders of the eye. Is the patient up to date with their annual eye exam?  Yes  Who is the provider or what is the name of the office in which the patient attends annual eye exams? Dr.McQuen  If pt is not established with a provider, would they like to be referred to a provider to establish care? No .   Dental Screening: Recommended annual dental exams for proper oral hygiene  Community Resource Referral / Chronic Care Management: CRR required this visit?  No   CCM required this visit?  No      Plan:     I have personally reviewed and noted the following in the patients chart:   Medical and social history Use of alcohol, tobacco or illicit drugs  Current medications and supplements including opioid prescriptions.  Functional ability and status Nutritional status Physical activity Advanced directives List of other physicians Hospitalizations, surgeries, and ER visits in previous 12 months Vitals Screenings to include cognitive, depression, and falls Referrals and appointments  In addition, I have reviewed and discussed with patient certain preventive protocols, quality metrics, and best practice recommendations. A written personalized care plan for preventive services as well as general preventive health recommendations were provided to patient.     Randel Pigg, LPN   2/84/1324   Nurse Notes: none

## 2021-05-29 ENCOUNTER — Encounter: Payer: Self-pay | Admitting: Certified Registered Nurse Anesthetist

## 2021-05-31 ENCOUNTER — Encounter: Payer: Medicare Other | Admitting: Gastroenterology

## 2021-05-31 ENCOUNTER — Ambulatory Visit (AMBULATORY_SURGERY_CENTER): Payer: Medicare Other | Admitting: Gastroenterology

## 2021-05-31 ENCOUNTER — Encounter: Payer: Self-pay | Admitting: Gastroenterology

## 2021-05-31 VITALS — BP 144/85 | HR 94 | Temp 98.0°F | Resp 17 | Ht 61.0 in

## 2021-05-31 DIAGNOSIS — D123 Benign neoplasm of transverse colon: Secondary | ICD-10-CM

## 2021-05-31 DIAGNOSIS — Z86010 Personal history of colonic polyps: Secondary | ICD-10-CM | POA: Diagnosis not present

## 2021-05-31 DIAGNOSIS — D122 Benign neoplasm of ascending colon: Secondary | ICD-10-CM | POA: Diagnosis not present

## 2021-05-31 DIAGNOSIS — D125 Benign neoplasm of sigmoid colon: Secondary | ICD-10-CM | POA: Diagnosis not present

## 2021-05-31 DIAGNOSIS — Z800 Family history of malignant neoplasm of digestive organs: Secondary | ICD-10-CM

## 2021-05-31 MED ORDER — SODIUM CHLORIDE 0.9 % IV SOLN: 500.0000 mL | Freq: Once | INTRAVENOUS | Status: DC

## 2021-05-31 NOTE — Progress Notes (Signed)
Report given to PACU, vss 

## 2021-05-31 NOTE — Op Note (Signed)
[place] Patient Name: Nancy Brooks Procedure Date: 05/31/2021 2:11 PM MRN: [id number] Endoscopist: Remo Lipps P. Havery Moros , MD Age: 71 Referring MD:  Date of Birth: 1951-03-27 Gender: Female Account #: [id number] Procedure:                Colonoscopy Indications:              High risk colon cancer surveillance: Personal                            history of colonic polyps (6/17 - sessile serrated                            polyps x 2), brother with colon cancer age 47 Medicines:                Monitored Anesthesia Care Procedure:                Pre-Anesthesia Assessment:                           - Prior to the procedure, a History and Physical                            was performed, and patient medications and                            allergies were reviewed. The patient's tolerance of                            previous anesthesia was also reviewed. The risks                            and benefits of the procedure and the sedation                            options and risks were discussed with the patient.                            All questions were answered, and informed consent                            was obtained. Prior Anticoagulants: The patient has                            taken no previous anticoagulant or antiplatelet                            agents. ASA Grade Assessment: II - A patient with                            mild systemic disease. After reviewing the risks                            and benefits, the patient was deemed in  satisfactory condition to undergo the procedure.                           After obtaining informed consent, the colonoscope                            was passed under direct vision. Throughout the                            procedure, the patient's blood pressure, pulse, and                            oxygen saturations were monitored continuously. The                            PCF-HQ190L  Colonoscope was introduced through the                            anus and advanced to the the cecum, identified by                            appendiceal orifice and ileocecal valve. The                            colonoscopy was performed without difficulty. The                            patient tolerated the procedure well. The quality                            of the bowel preparation was adequate. The                            ileocecal valve, appendiceal orifice, and rectum                            were photographed. Scope In: 2:21:33 PM Scope Out: 2:43:15 PM Scope Withdrawal Time: 0 hours 14 minutes 2 seconds  Total Procedure Duration: 0 hours 21 minutes 42 seconds  Findings:                 The perianal and digital rectal examinations were                            normal.                           A 3 to 4 mm polyp was found in the ascending colon.                            The polyp was sessile. The polyp was removed with a                            cold snare. Resection and retrieval were complete.  A 8 mm polyp was found in the hepatic flexure. The                            polyp was flat. The polyp was removed with a cold                            snare. Resection and retrieval were complete.                           Two sessile polyps were found in the sigmoid colon.                            The polyps were 3 to 6 mm in size. These polyps                            were removed with a cold snare. Resection and                            retrieval were complete.                           Internal hemorrhoids were found during retroflexion.                           The exam was otherwise without abnormality. Complications:            No immediate complications. Estimated blood loss:                            Minimal. Estimated Blood Loss:     Estimated blood loss was minimal. Impression:               - One 3 to 4 mm polyp in the  ascending colon,                            removed with a cold snare. Resected and retrieved.                           - One 8 mm polyp at the hepatic flexure, removed                            with a cold snare. Resected and retrieved.                           - Two 3 to 6 mm polyps in the sigmoid colon,                            removed with a cold snare. Resected and retrieved.                           - Internal hemorrhoids.                           -  The examination was otherwise normal. Recommendation:           - Patient has a contact number available for                            emergencies. The signs and symptoms of potential                            delayed complications were discussed with the                            patient. Return to normal activities tomorrow.                            Written discharge instructions were provided to the                            patient.                           - Resume previous diet.                           - Continue present medications.                           - Await pathology results. Remo Lipps P. Havery Moros, MD 05/31/2021 2:47:20 PM This report has been signed electronically.

## 2021-05-31 NOTE — Patient Instructions (Signed)
Discharge instructions given. Handouts on polyps and hemorrhoids. Resume previous medications. YOU HAD AN ENDOSCOPIC PROCEDURE TODAY AT THE [facility]:   Refer to the procedure report that was given to you for any specific questions about what was found during the examination.  If the procedure report does not answer your questions, please call your gastroenterologist to clarify.  If you requested that your care partner not be given the details of your procedure findings, then the procedure report has been included in a sealed envelope for you to review at your convenience later.  YOU SHOULD EXPECT: Some feelings of bloating in the abdomen. Passage of more gas than usual.  Walking can help get rid of the air that was put into your GI tract during the procedure and reduce the bloating. If you had a lower endoscopy (such as a colonoscopy or flexible sigmoidoscopy) you may notice spotting of blood in your stool or on the toilet paper. If you underwent a bowel prep for your procedure, you may not have a normal bowel movement for a few days.  Please Note:  You might notice some irritation and congestion in your nose or some drainage.  This is from the oxygen used during your procedure.  There is no need for concern and it should clear up in a day or so.  SYMPTOMS TO REPORT IMMEDIATELY:  Following lower endoscopy (colonoscopy or flexible sigmoidoscopy):  Excessive amounts of blood in the stool  Significant tenderness or worsening of abdominal pains  Swelling of the abdomen that is new, acute  Fever of 100F or higher   For urgent or emergent issues, a gastroenterologist can be reached at any hour by calling [phone]. Do not use MyChart messaging for urgent concerns.    DIET:  We do recommend a small meal at first, but then you may proceed to your regular diet.  Drink plenty of fluids but you should avoid alcoholic beverages for 24 hours.  ACTIVITY:  You should plan to take it  easy for the rest of today and you should NOT DRIVE or use heavy machinery until tomorrow (because of the sedation medicines used during the test).    FOLLOW UP: Our staff will call the number listed on your records 48-72 hours following your procedure to check on you and address any questions or concerns that you may have regarding the information given to you following your procedure. If we do not reach you, we will leave a message.  We will attempt to reach you two times.  During this call, we will ask if you have developed any symptoms of COVID 19. If you develop any symptoms (ie: fever, flu-like symptoms, shortness of breath, cough etc.) before then, please call [phone].  If you test positive for Covid 19 in the 2 weeks post procedure, please call and report this information to us.    If any biopsies were taken you will be contacted by phone or by letter within the next 1-3 weeks.  Please call us at [phone] if you have not heard about the biopsies in 3 weeks.    SIGNATURES/CONFIDENTIALITY: You and/or your care partner have signed paperwork which will be entered into your electronic medical record.  These signatures attest to the fact that that the information above on your After Visit Summary has been reviewed and is understood.  Full responsibility of the confidentiality of this discharge information lies with you and/or your care-partner.  

## 2021-05-31 NOTE — Progress Notes (Signed)
Pt unable to give a clear answer about last stools and if she is clear or not.  She states that she did complete all of the 2 day prep and sister confirms this.  Advised Dr Havery Moros of this.  He states that it is ok to proceed with colonoscopy.

## 2021-05-31 NOTE — Progress Notes (Signed)
[facility] History and Physical   Primary Care Physician:  Hoyt Koch, MD   Reason for Procedure:   History of colon polyps. FH of colon cancer  Plan:    colonoscopy     HPI: Nancy Brooks is a 71 y.o. female  here for colonoscopy surveillance - 2 SSPs removed 10/2015. Brother had colon cancer age 80. Patient has some chronic constipation, double prep used for this exam. Otherwise feels well without any cardiopulmonary symptoms.    Past Medical History:  Diagnosis Date   Anxiety    BURSITIS, RIGHT KNEE    Depression    Dyskinesia of esophagus    GAIT DISTURBANCE    GERD    HEMORRHOIDS, NOS    HYPERLIPIDEMIA    MENOPAUSAL SYNDROME    Osteoporosis dx 03/2011   DEXA at gyn, started boniva in additon to Ca+D, changed to Prolia 09/2012   SCHIZOPHRENIA    SYMPTOM, INCONTINENCE, URGE     Past Surgical History:  Procedure Laterality Date   ARTHRODESIS METATARSAL Left 01/29/2021   Procedure: ARTHRODESIS TARSOMETATARSAL LEFT FOOT;  Surgeon: [name], DPM;  Location: [facility];  Service: Podiatry;  Laterality: Left;  GENERAL WITH BLOCK   BREAST SURGERY  05/09/1998   left breast biopsy   CLOSED REDUCTION METATARSAL Left 12/11/2020   Procedure: CLOSED REDUCTION METATARSAL;  Surgeon: [name], DPM;  Location: [facility];  Service: Podiatry;  Laterality: Left;  reduction and pinning (2.55mm Steinmann pins)   COLONOSCOPY  03/16/2010   also 2017    Prior to Admission medications   Medication Sig Start Date End Date Taking? Authorizing Provider  chlordiazePOXIDE (LIBRIUM) 10 MG capsule 1 in the morning and 1 at bedtime 03/02/21  Yes Cottle, Billey Co., MD  cholecalciferol (VITAMIN D3) 25 MCG (1000 UNIT) tablet Take 1,000 Units by mouth daily.   Yes [provider]  cloZAPine (CLOZARIL) 100 MG tablet TAKE 1 TABLET BY MOUTH EVERY MORNING AND 4 TABLETS EVERY EVENING 01/22/21  Yes Cottle, Billey Co., MD  famotidine (PEPCID) 10 MG tablet Take 10  mg by mouth daily.    Yes [provider]  LINZESS 72 MCG capsule Take 72 mcg by mouth daily. 01/25/21  Yes [provider]  polyethylene glycol (MIRALAX / GLYCOLAX) 17 g packet Take 17 g by mouth daily.   Yes [provider]  simvastatin (ZOCOR) 40 MG tablet Take 1 tablet (40 mg total) by mouth daily at 6 PM. Due for physical at the end of November 04/29/21  Yes Hoyt Koch, MD  vitamin B-12 (CYANOCOBALAMIN) 1000 MCG tablet Take 1 tablet (1,000 mcg total) by mouth daily. 08/31/17  Yes Hoyt Koch, MD  acetaminophen (TYLENOL) 325 MG tablet Take 2 tablets (650 mg total) by mouth every 6 (six) hours as needed for mild pain. 02/05/21   Patrecia Pour, MD  calcium elemental as carbonate (BARIATRIC TUMS ULTRA) 400 MG chewable tablet Chew 1,000 mg by mouth daily.    [provider]  ibandronate (BONIVA) 150 MG tablet Take 150 mg by mouth every 30 (thirty) days. Take in the morning with a full glass of water, on an empty stomach, and do not take anything else by mouth or lie down for the next 30 min.    [provider]    Current Outpatient Medications  Medication Sig Dispense Refill   chlordiazePOXIDE (LIBRIUM) 10 MG capsule 1 in the morning and 1 at bedtime 60 capsule 5  cholecalciferol (VITAMIN D3) 25 MCG (1000 UNIT) tablet Take 1,000 Units by mouth daily.     cloZAPine (CLOZARIL) 100 MG tablet TAKE 1 TABLET BY MOUTH EVERY MORNING AND 4 TABLETS EVERY EVENING 140 tablet 11   famotidine (PEPCID) 10 MG tablet Take 10 mg by mouth daily.      LINZESS 72 MCG capsule Take 72 mcg by mouth daily.     polyethylene glycol (MIRALAX / GLYCOLAX) 17 g packet Take 17 g by mouth daily.     simvastatin (ZOCOR) 40 MG tablet Take 1 tablet (40 mg total) by mouth daily at 6 PM. Due for physical at the end of November 30 tablet 0   vitamin B-12 (CYANOCOBALAMIN) 1000 MCG tablet Take 1 tablet (1,000 mcg total) by mouth daily. 90 tablet 3   acetaminophen (TYLENOL)  325 MG tablet Take 2 tablets (650 mg total) by mouth every 6 (six) hours as needed for mild pain.     calcium elemental as carbonate (BARIATRIC TUMS ULTRA) 400 MG chewable tablet Chew 1,000 mg by mouth daily.     ibandronate (BONIVA) 150 MG tablet Take 150 mg by mouth every 30 (thirty) days. Take in the morning with a full glass of water, on an empty stomach, and do not take anything else by mouth or lie down for the next 30 min.     Current Facility-Administered Medications  Medication Dose Route Frequency Provider Last Rate Last Admin   0.9 %  sodium chloride infusion  500 mL Intravenous Once Brittan Butterbaugh, Carlota Raspberry, MD        Allergies as of 05/31/2021   (No Known Allergies)    Family History  Problem Relation Age of Onset   Hypertension Mother    Depression Mother    Prostate cancer Father    Colon cancer Brother 58   Esophageal cancer Neg Hx    Pancreatic cancer Neg Hx    Rectal cancer Neg Hx    Stomach cancer Neg Hx     Social History   Socioeconomic History   Marital status: Single    Spouse name: Not on file   Number of children: Not on file   Years of education: Not on file   Highest education level: Not on file  Occupational History   Not on file  Tobacco Use   Smoking status: Never   Smokeless tobacco: Never  Vaping Use   Vaping Use: Never used  Substance and Sexual Activity   Alcohol use: No    Alcohol/week: 0.0 standard drinks   Drug use: No   Sexual activity: Not on file  Other Topics Concern   Not on file  Social History Narrative   Lives alone in single family home. Sister provides support and come to visits. Pt drives and indep ADLs.    Social Determinants of Health   Financial Resource Strain: Low Risk    Difficulty of Paying Living Expenses: Not hard at all  Food Insecurity: No Food Insecurity   Worried About Charity fundraiser in the Last Year: Never true   [place] in the Last Year: Never true  Transportation Needs: No Transportation  Needs   Lack of Transportation (Medical): No   Lack of Transportation (Non-Medical): No  Physical Activity: Insufficiently Active   Days of Exercise per Week: 5 days   Minutes of Exercise per Session: 20 min  Stress: No Stress Concern Present   Feeling of Stress : Not at all  Social Connections: Socially  Isolated   Frequency of Communication with Friends and Family: Twice a week   Frequency of Social Gatherings with Friends and Family: Twice a week   Attends Religious Services: Never   Marine scientist or Organizations: No   Attends Music therapist: Never   Marital Status: Never married  Human resources officer Violence: Not At Risk   Fear of Current or Ex-Partner: No   Emotionally Abused: No   Physically Abused: No   Sexually Abused: No    Review of Systems: All other review of systems negative except as mentioned in the HPI.  Physical Exam: Vital signs BP 118/66    Pulse 97    Temp 98 F (36.7 C)    Resp 13    Ht 5\' 1"  (1.549 m)    SpO2 99%    BMI 21.54 kg/m   General:   Alert,  Well-developed, pleasant and cooperative in NAD Lungs:  Clear throughout to auscultation.   Heart:  Regular rate and rhythm Abdomen:  Soft, nontender and nondistended.   Neuro/Psych:  Alert and cooperative. Normal mood and affect. A and O x 3  Jolly Mango, MD [facility]

## 2021-05-31 NOTE — Progress Notes (Signed)
Called to room to assist during endoscopic procedure.  Patient ID and intended procedure confirmed with present staff. Received instructions for my participation in the procedure from the performing physician.  

## 2021-06-02 ENCOUNTER — Telehealth: Payer: Self-pay | Admitting: *Deleted

## 2021-06-02 NOTE — Telephone Encounter (Signed)
°  Follow up Call-  Call back number 05/31/2021  Post procedure Call Back phone  # [phone] (sister's #)  Permission to leave phone message Yes  Some recent data might be hidden     Patient questions: Message left to call us if necessary.

## 2021-06-11 ENCOUNTER — Other Ambulatory Visit: Payer: Self-pay | Admitting: Internal Medicine

## 2021-06-11 DIAGNOSIS — Z1231 Encounter for screening mammogram for malignant neoplasm of breast: Secondary | ICD-10-CM

## 2021-06-22 ENCOUNTER — Encounter: Payer: Medicare Other | Admitting: Internal Medicine

## 2021-06-30 ENCOUNTER — Ambulatory Visit
Admission: RE | Admit: 2021-06-30 | Discharge: 2021-06-30 | Disposition: A | Discharge status: Home / Self Care | Payer: Medicare Other | Source: Ambulatory Visit | Attending: Internal Medicine | Admitting: Internal Medicine

## 2021-06-30 DIAGNOSIS — Z1231 Encounter for screening mammogram for malignant neoplasm of breast: Secondary | ICD-10-CM

## 2021-07-01 ENCOUNTER — Encounter: Payer: Self-pay | Admitting: Internal Medicine

## 2021-07-01 ENCOUNTER — Other Ambulatory Visit: Payer: Self-pay

## 2021-07-01 ENCOUNTER — Ambulatory Visit (INDEPENDENT_AMBULATORY_CARE_PROVIDER_SITE_OTHER): Payer: Medicare Other | Admitting: Internal Medicine

## 2021-07-01 VITALS — BP 134/88 | HR 100 | Temp 98.2°F | Resp 18 | Ht 61.0 in | Wt 119.2 lb

## 2021-07-01 DIAGNOSIS — E538 Deficiency of other specified B group vitamins: Secondary | ICD-10-CM | POA: Diagnosis not present

## 2021-07-01 DIAGNOSIS — M810 Age-related osteoporosis without current pathological fracture: Secondary | ICD-10-CM

## 2021-07-01 DIAGNOSIS — F209 Schizophrenia, unspecified: Secondary | ICD-10-CM

## 2021-07-01 DIAGNOSIS — E785 Hyperlipidemia, unspecified: Secondary | ICD-10-CM

## 2021-07-01 DIAGNOSIS — K5904 Chronic idiopathic constipation: Secondary | ICD-10-CM

## 2021-07-01 DIAGNOSIS — R159 Full incontinence of feces: Secondary | ICD-10-CM | POA: Diagnosis not present

## 2021-07-01 MED ORDER — SIMVASTATIN 40 MG PO TABS: 40.0000 mg | ORAL_TABLET | Freq: Every day | ORAL | 3 refills | Status: DC

## 2021-07-01 NOTE — Progress Notes (Signed)
° °  Subjective:   Patient ID: Nancy Brooks, female    DOB: 1950/11/09, 71 y.o.   MRN: [id number]  HPI The patient is a 71 YO female coming in for follow up.  Review of Systems  Constitutional: Negative.   HENT: Negative.    Eyes: Negative.   Respiratory:  Negative for cough, chest tightness and shortness of breath.   Cardiovascular:  Negative for chest pain, palpitations and leg swelling.  Gastrointestinal:  Negative for abdominal distention, abdominal pain, constipation, diarrhea, nausea and vomiting.  Musculoskeletal: Negative.   Skin: Negative.   Neurological: Negative.   Psychiatric/Behavioral: Negative.     Objective:  Physical Exam Constitutional:      Appearance: She is well-developed.  HENT:     Head: Normocephalic and atraumatic.  Cardiovascular:     Rate and Rhythm: Normal rate and regular rhythm.  Pulmonary:     Effort: Pulmonary effort is normal. No respiratory distress.     Breath sounds: Normal breath sounds. No wheezing or rales.  Abdominal:     General: Bowel sounds are normal. There is no distension.     Palpations: Abdomen is soft.     Tenderness: There is no abdominal tenderness. There is no rebound.  Musculoskeletal:     Cervical back: Normal range of motion.  Skin:    General: Skin is warm and dry.  Neurological:     Mental Status: She is alert and oriented to person, place, and time.     Coordination: Coordination normal.    Vitals:   07/01/21 1042  BP: 134/88  Pulse: 100  Resp: 18  Temp: 98.2 F (36.8 C)  SpO2: 98%  Weight: 119 lb 3.2 oz (54.1 kg)  Height: 5\' 1"  (1.549 m)    This visit occurred during the SARS-CoV-2 public health emergency.  Safety protocols were in place, including screening questions prior to the visit, additional usage of staff PPE, and extensive cleaning of exam room while observing appropriate contact time as indicated for disinfecting solutions.   Assessment & Plan:

## 2021-07-01 NOTE — Patient Instructions (Addendum)
We will order the bone density. We have refilled the medicine.

## 2021-07-02 NOTE — Assessment & Plan Note (Signed)
[facility] psych for management and overall stable.

## 2021-07-02 NOTE — Assessment & Plan Note (Signed)
Taking boniva. Checking DEXA and adjust if needed. Foot fracture and it is unclear if this represents fragility fracture.

## 2021-07-02 NOTE — Assessment & Plan Note (Signed)
Doing better with linzess and miralax to help with emptying.

## 2021-07-02 NOTE — Assessment & Plan Note (Signed)
Will skip lipids this year as she has been stable for some time on simvastatin and has had a lot of labs this year.

## 2021-07-02 NOTE — Assessment & Plan Note (Signed)
Doing better with linzess and miralax to help with regularity.

## 2021-07-02 NOTE — Assessment & Plan Note (Signed)
Taking otc and no symptoms of recurrence.

## 2021-07-09 ENCOUNTER — Encounter: Payer: Self-pay | Admitting: Psychiatry

## 2021-07-13 ENCOUNTER — Encounter: Payer: Self-pay | Admitting: Psychiatry

## 2021-07-13 ENCOUNTER — Other Ambulatory Visit: Payer: Self-pay

## 2021-07-13 ENCOUNTER — Ambulatory Visit (INDEPENDENT_AMBULATORY_CARE_PROVIDER_SITE_OTHER): Payer: Medicare Other | Admitting: Psychiatry

## 2021-07-13 DIAGNOSIS — F200 Paranoid schizophrenia: Secondary | ICD-10-CM | POA: Diagnosis not present

## 2021-07-13 DIAGNOSIS — F411 Generalized anxiety disorder: Secondary | ICD-10-CM | POA: Diagnosis not present

## 2021-07-13 DIAGNOSIS — K5903 Drug induced constipation: Secondary | ICD-10-CM | POA: Diagnosis not present

## 2021-07-13 DIAGNOSIS — Z79899 Other long term (current) drug therapy: Secondary | ICD-10-CM

## 2021-07-13 MED ORDER — CLOZAPINE 100 MG PO TABS: ORAL_TABLET | ORAL | 11 refills | Status: DC

## 2021-07-13 MED ORDER — CHLORDIAZEPOXIDE HCL 10 MG PO CAPS: ORAL_CAPSULE | ORAL | 5 refills | Status: DC
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
## 2021-07-14 ENCOUNTER — Encounter: Payer: Self-pay | Admitting: Psychiatry

## 2021-08-03 ENCOUNTER — Encounter: Payer: Self-pay | Admitting: Internal Medicine

## 2021-08-03 ENCOUNTER — Ambulatory Visit (INDEPENDENT_AMBULATORY_CARE_PROVIDER_SITE_OTHER): Payer: Medicare Other | Admitting: Internal Medicine

## 2021-08-03 VITALS — BP 102/68 | HR 117 | Resp 18 | Ht 61.0 in | Wt 123.2 lb

## 2021-08-03 DIAGNOSIS — M7989 Other specified soft tissue disorders: Secondary | ICD-10-CM | POA: Diagnosis not present

## 2021-08-03 DIAGNOSIS — R0600 Dyspnea, unspecified: Secondary | ICD-10-CM

## 2021-08-03 LAB — COMPREHENSIVE METABOLIC PANEL
ALT: 14 U/L (ref 0–35)
AST: 15 U/L (ref 0–37)
Albumin: 4.2 g/dL (ref 3.5–5.2)
Alkaline Phosphatase: 119 U/L — ABNORMAL HIGH (ref 39–117)
BUN: 20 mg/dL (ref 6–23)
CO2: 30 mEq/L (ref 19–32)
Calcium: 8.8 mg/dL (ref 8.4–10.5)
Chloride: 100 mEq/L (ref 96–112)
Creatinine, Ser: 0.82 mg/dL (ref 0.40–1.20)
GFR: 73.47 mL/min (ref >60.00)
Glucose, Bld: 76 mg/dL (ref 70–99)
Potassium: 4.2 mEq/L (ref 3.5–5.1)
Sodium: 137 mEq/L (ref 135–145)
Total Bilirubin: 0.2 mg/dL (ref 0.2–1.2)
Total Protein: 6.8 g/dL (ref 6.0–8.3)

## 2021-08-03 LAB — CBC
HCT: 36.2 % (ref 36.0–46.0)
Hemoglobin: 12.2 g/dL (ref 12.0–15.0)
MCHC: 33.6 g/dL (ref 30.0–36.0)
MCV: 85.6 fl (ref 78.0–100.0)
Platelets: 285 10*3/uL (ref 150.0–400.0)
RBC: 4.23 Mil/uL (ref 3.87–5.11)
RDW: 14.7 % (ref 11.5–15.5)
WBC: 7.2 10*3/uL (ref 4.0–10.5)

## 2021-08-03 LAB — BRAIN NATRIURETIC PEPTIDE: Pro B Natriuretic peptide (BNP): 10 pg/mL (ref 0.0–100.0)

## 2021-08-03 NOTE — Progress Notes (Signed)
? ?  Subjective:  ? ?Patient ID: Nancy Brooks, female    DOB: 03-10-1951, 71 y.o.   MRN: [id number] ? ?HPI ?The patient and her sister come in for right leg swelling. No injury recently.  ? ?Review of Systems  ?Constitutional: Negative.   ?HENT: Negative.    ?Eyes: Negative.   ?Respiratory:  Negative for cough, chest tightness and shortness of breath.   ?Cardiovascular:  Positive for leg swelling. Negative for chest pain and palpitations.  ?Gastrointestinal:  Negative for abdominal distention, abdominal pain, constipation, diarrhea, nausea and vomiting.  ?Musculoskeletal: Negative.   ?Skin: Negative.   ?Neurological: Negative.   ?Psychiatric/Behavioral: Negative.    ? ?Objective:  ?Physical Exam ?Constitutional:   ?   Appearance: She is well-developed.  ?HENT:  ?   Head: Normocephalic and atraumatic.  ?Cardiovascular:  ?   Rate and Rhythm: Normal rate and regular rhythm.  ?Pulmonary:  ?   Effort: Pulmonary effort is normal. No respiratory distress.  ?   Breath sounds: Normal breath sounds. No wheezing or rales.  ?Abdominal:  ?   General: Bowel sounds are normal. There is no distension.  ?   Palpations: Abdomen is soft.  ?   Tenderness: There is no abdominal tenderness. There is no rebound.  ?Musculoskeletal:  ?   Cervical back: Normal range of motion.  ?Skin: ?   General: Skin is warm and dry.  ?Neurological:  ?   Mental Status: She is alert and oriented to person, place, and time.  ?   Coordination: Coordination normal.  ? ? ?Vitals:  ? 08/03/21 1425  ?BP: 102/68  ?Pulse: (!) 117  ?Resp: 18  ?SpO2: 98%  ?Weight: 123 lb 3.2 oz (55.9 kg)  ?Height: '5\' 1"'$  (1.549 m)  ? ? ?This visit occurred during the SARS-CoV-2 public health emergency.  Safety protocols were in place, including screening questions prior to the visit, additional usage of staff PPE, and extensive cleaning of exam room while observing appropriate contact time as indicated for disinfecting solutions.  ? ?Assessment & Plan:  ? ?

## 2021-08-03 NOTE — Patient Instructions (Signed)
We will check the labs today. 

## 2021-08-06 ENCOUNTER — Encounter: Payer: Self-pay | Admitting: Internal Medicine

## 2021-08-06 DIAGNOSIS — M7989 Other specified soft tissue disorders: Secondary | ICD-10-CM | POA: Insufficient documentation

## 2021-08-06 NOTE — Assessment & Plan Note (Signed)
Checking CBC, CMP, BNP to rule out sources. This is improved at time of visit but had been present for 3 weeks. She has had no change in diet or exercise and no injury.  ?

## 2021-09-01 IMAGING — MG DIGITAL SCREENING BILAT W/ TOMO W/ CAD
6 of 12 series · 6 of 36 positions shown · non-contrast
Comparison: Previous exam(s).

CLINICAL DATA: Screening.

EXAM:
DIGITAL SCREENING BILATERAL MAMMOGRAM WITH TOMO AND CAD

[L MLO synth-2D (1 of 2)]
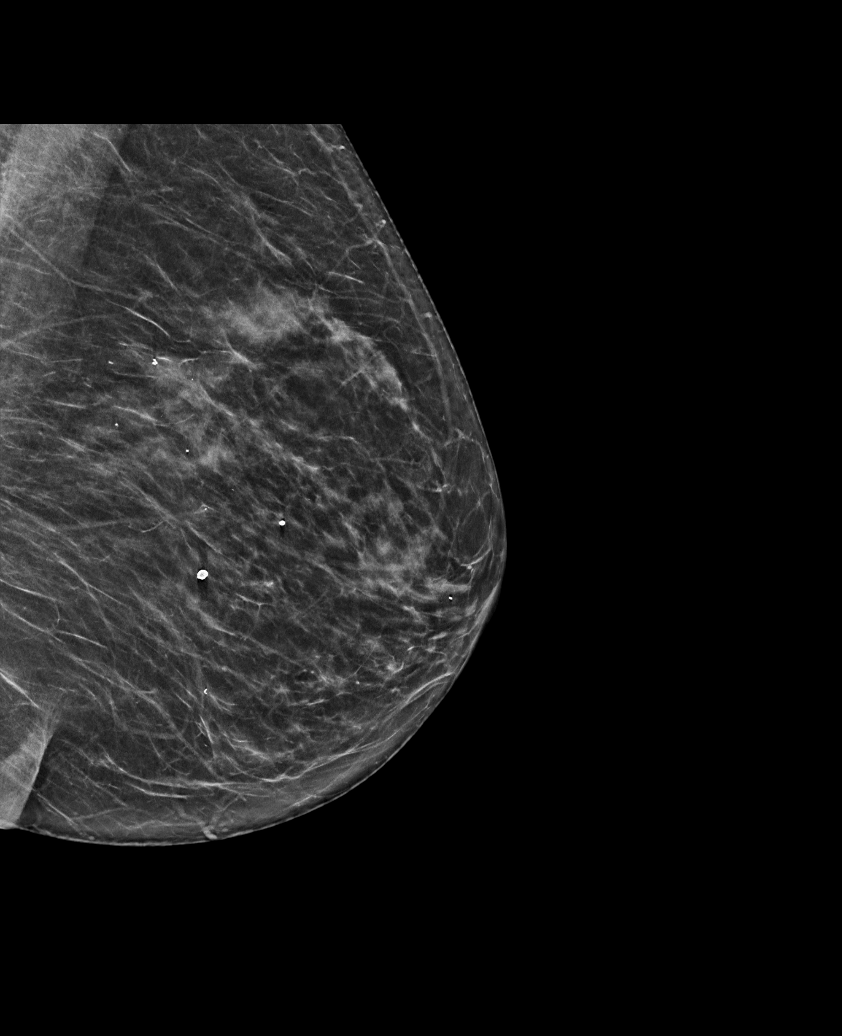

[R MLO synth-2D (1 of 2)]
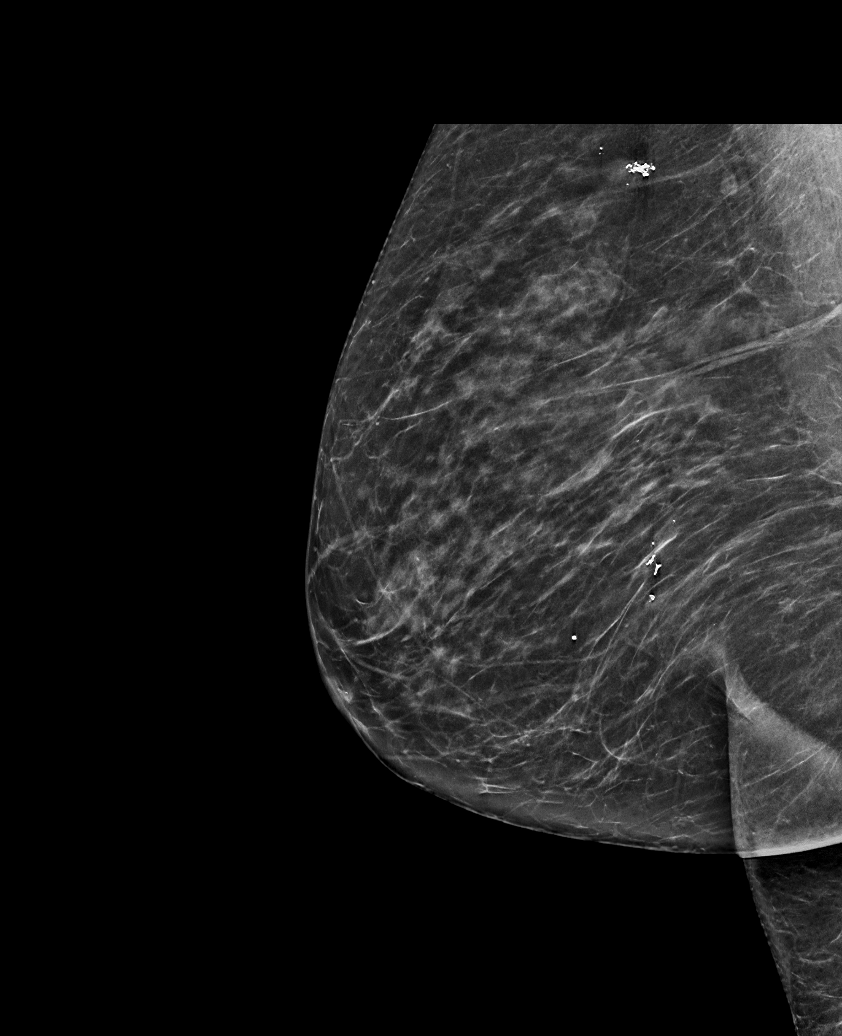

[L MLO synth-2D (2 of 2)]
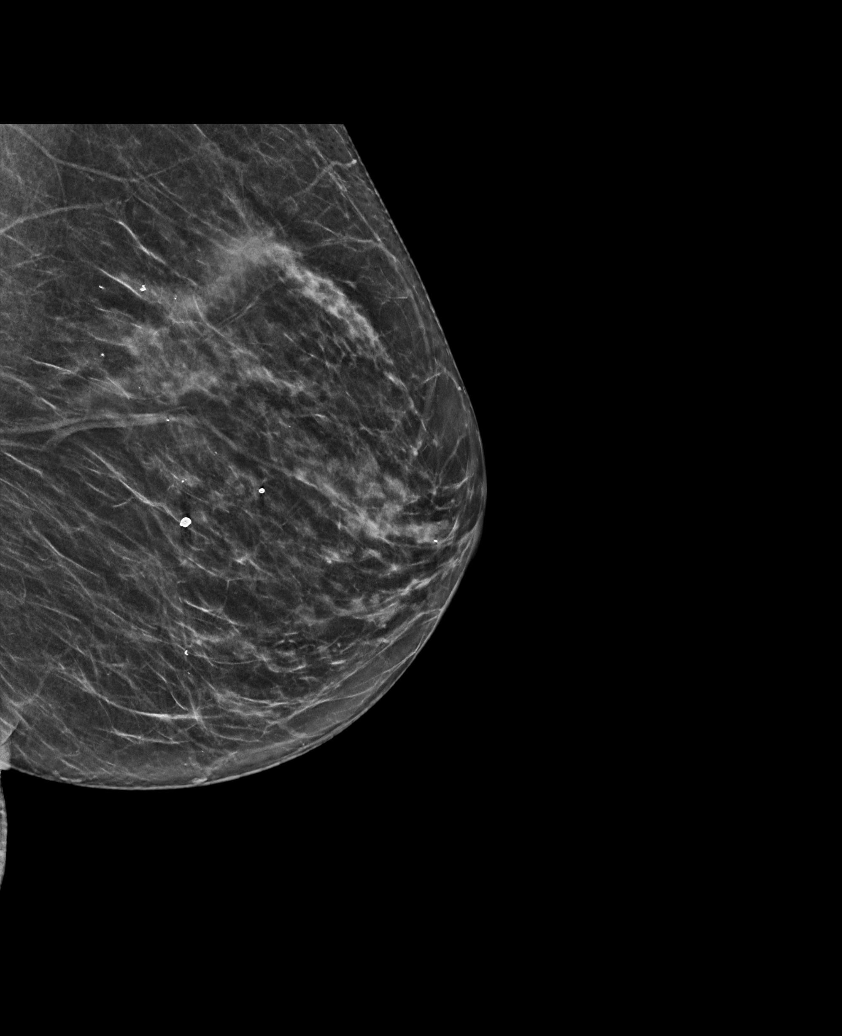

[R CC synth-2D]
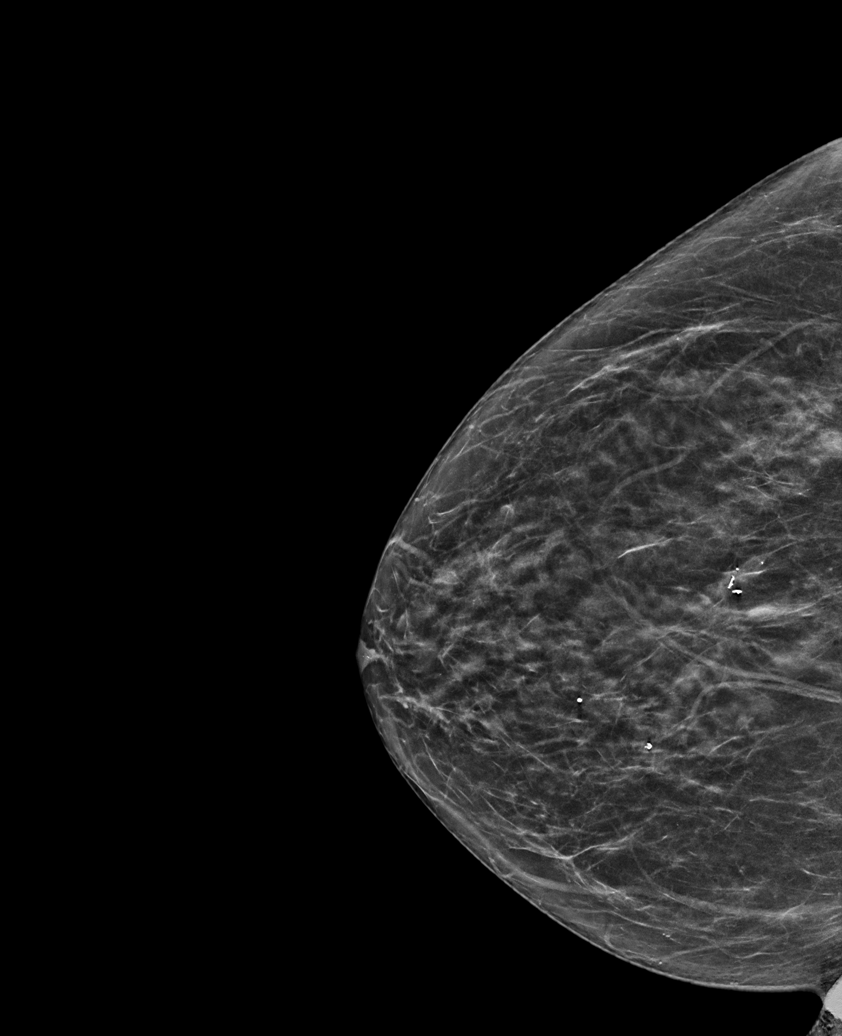

[L CC synth-2D]
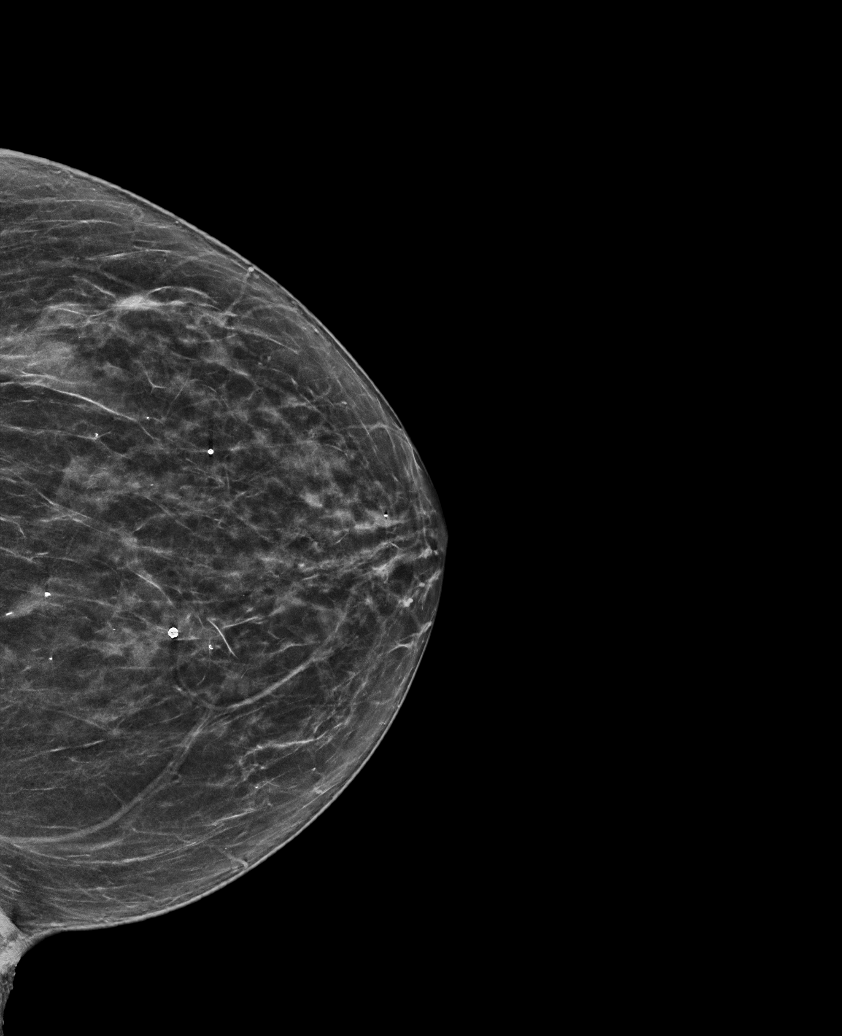

[R MLO synth-2D (2 of 2)]
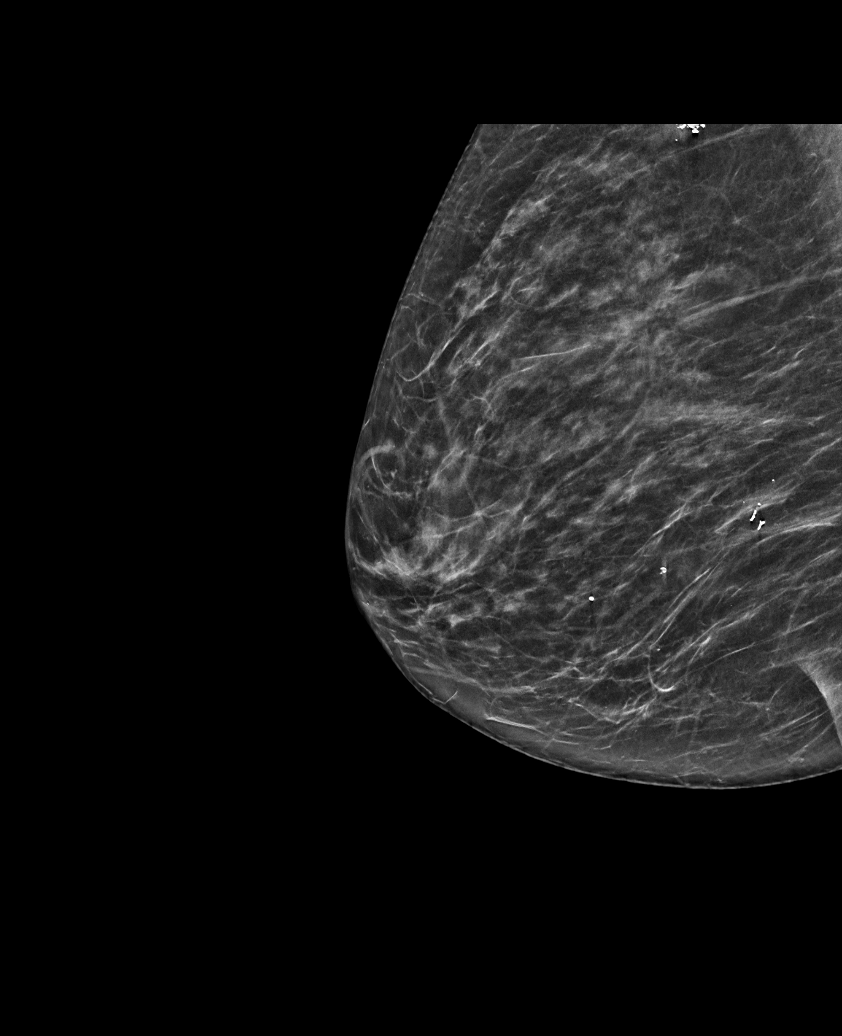

[6 of 36 positions shown; findings below may reference images not displayed]

ACR Breast Density Category c: The breast tissue is heterogeneously
dense, which may obscure small masses.
FINDINGS: There are no findings suspicious for malignancy. Images were
processed with CAD.
IMPRESSION: No mammographic evidence of malignancy. A result letter of this
screening mammogram will be mailed directly to the patient.

RECOMMENDATION:
Screening mammogram in one year. (Code:FT-U-LHB)

BI-RADS CATEGORY  1: Negative.

## 2021-09-08 ENCOUNTER — Encounter: Payer: Self-pay | Admitting: Psychiatry

## 2021-09-21 ENCOUNTER — Ambulatory Visit (INDEPENDENT_AMBULATORY_CARE_PROVIDER_SITE_OTHER): Payer: Medicare Other | Admitting: Psychiatry

## 2021-09-21 ENCOUNTER — Encounter: Payer: Self-pay | Admitting: Psychiatry

## 2021-09-21 DIAGNOSIS — F411 Generalized anxiety disorder: Secondary | ICD-10-CM

## 2021-09-21 DIAGNOSIS — K5903 Drug induced constipation: Secondary | ICD-10-CM | POA: Diagnosis not present

## 2021-09-21 DIAGNOSIS — F200 Paranoid schizophrenia: Secondary | ICD-10-CM

## 2021-09-21 DIAGNOSIS — Z79899 Other long term (current) drug therapy: Secondary | ICD-10-CM | POA: Diagnosis not present
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
## 2021-10-07 LAB — CBC WITH DIFFERENTIAL/PLATELET
Basophils Absolute: 0 10*3/uL (ref 0.0–0.2)
Basos: 0 %
EOS (ABSOLUTE): 0 10*3/uL (ref 0.0–0.4)
Eos: 0 %
Hematocrit: 39 % (ref 34.0–46.6)
Hemoglobin: 13.1 g/dL (ref 11.1–15.9)
Immature Grans (Abs): 0 10*3/uL (ref 0.0–0.1)
Immature Granulocytes: 1 %
Lymphocytes Absolute: 1.3 10*3/uL (ref 0.7–3.1)
Lymphs: 21 %
MCH: 29.2 pg (ref 26.6–33.0)
MCHC: 33.6 g/dL (ref 31.5–35.7)
MCV: 87 fL (ref 79–97)
Monocytes Absolute: 0.5 10*3/uL (ref 0.1–0.9)
Monocytes: 8 %
Neutrophils Absolute: 4.3 10*3/uL (ref 1.4–7.0)
Neutrophils: 70 %
Platelets: 235 10*3/uL (ref 150–450)
RBC: 4.49 x10E6/uL (ref 3.77–5.28)
RDW: 13.5 % (ref 11.7–15.4)
WBC: 6.2 10*3/uL (ref 3.4–10.8)

## 2021-11-04 ENCOUNTER — Encounter: Payer: Self-pay | Admitting: Psychiatry

## 2021-11-28 ENCOUNTER — Other Ambulatory Visit: Payer: Self-pay | Admitting: Physician Assistant

## 2021-11-30 ENCOUNTER — Encounter: Payer: Self-pay | Admitting: Psychiatry

## 2021-11-30 ENCOUNTER — Telehealth: Payer: Self-pay | Admitting: Physician Assistant

## 2021-11-30 ENCOUNTER — Ambulatory Visit (INDEPENDENT_AMBULATORY_CARE_PROVIDER_SITE_OTHER): Payer: Medicare Other | Admitting: Psychiatry

## 2021-11-30 DIAGNOSIS — Z79899 Other long term (current) drug therapy: Secondary | ICD-10-CM | POA: Diagnosis not present

## 2021-11-30 DIAGNOSIS — F411 Generalized anxiety disorder: Secondary | ICD-10-CM

## 2021-11-30 DIAGNOSIS — K5903 Drug induced constipation: Secondary | ICD-10-CM

## 2021-11-30 DIAGNOSIS — F200 Paranoid schizophrenia: Secondary | ICD-10-CM

## 2021-11-30 MED ORDER — CHLORDIAZEPOXIDE HCL 10 MG PO CAPS: ORAL_CAPSULE | ORAL | 5 refills | Status: DC

## 2021-11-30 NOTE — Telephone Encounter (Signed)
Returned call to patient. I informed her that her Linzess was refilled for a 66-monthsupply yesterday. She reported that the pharmacy only gave her 30 tablets. I informed pt that the pharmacy may not have had enough in stock and to call them back. I advised pt to contact the pharmacy when she is due for a refill and the pharmacy will contact [place] I told the pt at any point if she is due for an office visit we will let her know. Pt verbalized understanding and had no concerns at the end of the call.
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
## 2021-12-02 ENCOUNTER — Encounter: Payer: Self-pay | Admitting: Psychiatry

## 2021-12-30 ENCOUNTER — Encounter: Payer: Self-pay | Admitting: Psychiatry

## 2022-01-27 ENCOUNTER — Encounter: Payer: Self-pay | Admitting: Psychiatry

## 2022-02-22 ENCOUNTER — Encounter: Payer: Self-pay | Admitting: Psychiatry

## 2022-02-22 ENCOUNTER — Ambulatory Visit (INDEPENDENT_AMBULATORY_CARE_PROVIDER_SITE_OTHER): Payer: Medicare Other | Admitting: Psychiatry

## 2022-02-22 ENCOUNTER — Other Ambulatory Visit: Payer: Self-pay | Admitting: Physician Assistant

## 2022-02-22 DIAGNOSIS — F200 Paranoid schizophrenia: Secondary | ICD-10-CM

## 2022-02-22 DIAGNOSIS — Z79899 Other long term (current) drug therapy: Secondary | ICD-10-CM

## 2022-02-22 DIAGNOSIS — F411 Generalized anxiety disorder: Secondary | ICD-10-CM

## 2022-02-22 DIAGNOSIS — K5903 Drug induced constipation: Secondary | ICD-10-CM | POA: Diagnosis not present
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
## 2022-02-24 ENCOUNTER — Encounter: Payer: Self-pay | Admitting: Psychiatry

## 2022-02-26 LAB — CLOZAPINE (CLOZARIL)
Clozapine Lvl: 1542 ng/mL — ABNORMAL HIGH (ref 350–600)
NorClozapine: 871 ng/mL
Total(Cloz+Norcloz): 2413 ng/mL

## 2022-03-01 ENCOUNTER — Telehealth: Payer: Self-pay | Admitting: Psychiatry

## 2022-03-01 NOTE — Telephone Encounter (Signed)
Patient Status form updated

## 2022-03-01 NOTE — Telephone Encounter (Signed)
Noted Dr. Clovis Pu was notified yesterday of elevated clozapine level.

## 2022-03-01 NOTE — Telephone Encounter (Signed)
[name] got the last fill of Lennis's  clozapine on 9/24, labwork was done but she could not get it thru pharmacy last week. Can we please call pharmacy to get this filled. Labs in [place] 10/18.

## 2022-03-01 NOTE — Telephone Encounter (Signed)
[place]. There is message in [facility] - do not dispense, interrupt treatment. Pharmacist said she had never seen this before. She had Clozaril level and CBC done 10/18.

## 2022-03-14 ENCOUNTER — Telehealth: Payer: Self-pay | Admitting: Psychiatry

## 2022-03-14 NOTE — Telephone Encounter (Signed)
Nancy Brooks stated you were going to call her to discuss starting fluvoxamine for [name] on November 1st.Nothing was sent yet so she is checking on it

## 2022-03-14 NOTE — Telephone Encounter (Signed)
Pt sister, [name], Lvm @ 9:27a.  She is on DPR. She said she called Fri.  She needs clarification on a new medication that Dr Clovis Pu prescribed for pt.  Next appt 1/9

## 2022-03-22 NOTE — Telephone Encounter (Signed)
After reviewing the blood level and thinking about the situation, I have decided that it is too risky medically to try the fluvoxamine augmentation that I had been considering.  Therefore continue the current medications and I do not want to make any changes.  Pls inform the patient's sister Nancy Brooks who helps manage her meds.

## 2022-03-24 ENCOUNTER — Encounter: Payer: Self-pay | Admitting: Psychiatry

## 2022-04-06 ENCOUNTER — Telehealth: Payer: Self-pay | Admitting: Psychiatry

## 2022-04-06 NOTE — Telephone Encounter (Signed)
Noted thanks. I believe she faxed a message to me as well.

## 2022-04-06 NOTE — Telephone Encounter (Signed)
[name] Pt's sister called requesting to pick up lab order, due 12/14. Call [name] [phone] when ready to pick up.

## 2022-04-07 ENCOUNTER — Other Ambulatory Visit: Payer: Self-pay | Admitting: Psychiatry

## 2022-04-07 ENCOUNTER — Other Ambulatory Visit: Payer: Self-pay

## 2022-04-07 DIAGNOSIS — F200 Paranoid schizophrenia: Secondary | ICD-10-CM

## 2022-04-07 DIAGNOSIS — Z79899 Other long term (current) drug therapy: Secondary | ICD-10-CM

## 2022-04-08 NOTE — Telephone Encounter (Signed)
Orders printed. [name] notified

## 2022-04-20 ENCOUNTER — Other Ambulatory Visit: Payer: Self-pay | Admitting: Internal Medicine

## 2022-04-21 ENCOUNTER — Encounter: Payer: Self-pay | Admitting: Psychiatry

## 2022-05-10 ENCOUNTER — Telehealth: Payer: Self-pay | Admitting: Internal Medicine

## 2022-05-10 NOTE — Telephone Encounter (Signed)
Left message for patient to call back to schedule Medicare Annual Wellness Visit   Last AWV  05/24/21  Please schedule at anytime with [facility] if patient calls the office back.    Any questions, please call me at [phone]

## 2022-05-17 ENCOUNTER — Ambulatory Visit (INDEPENDENT_AMBULATORY_CARE_PROVIDER_SITE_OTHER): Payer: Medicare Other | Admitting: Psychiatry

## 2022-05-17 ENCOUNTER — Encounter: Payer: Self-pay | Admitting: Psychiatry

## 2022-05-17 DIAGNOSIS — F200 Paranoid schizophrenia: Secondary | ICD-10-CM | POA: Diagnosis not present

## 2022-05-17 DIAGNOSIS — K5903 Drug induced constipation: Secondary | ICD-10-CM

## 2022-05-17 DIAGNOSIS — Z79899 Other long term (current) drug therapy: Secondary | ICD-10-CM | POA: Diagnosis not present

## 2022-05-17 DIAGNOSIS — F411 Generalized anxiety disorder: Secondary | ICD-10-CM | POA: Diagnosis not present
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
## 2022-05-19 ENCOUNTER — Encounter: Payer: Self-pay | Admitting: Psychiatry

## 2022-05-23 ENCOUNTER — Other Ambulatory Visit: Payer: Self-pay | Admitting: Physician Assistant

## 2022-06-16 ENCOUNTER — Ambulatory Visit: Payer: Medicare Other | Admitting: Physician Assistant

## 2022-06-16 ENCOUNTER — Encounter: Payer: Self-pay | Admitting: Psychiatry

## 2022-06-17 ENCOUNTER — Other Ambulatory Visit: Payer: Self-pay | Admitting: Obstetrics and Gynecology

## 2022-06-17 DIAGNOSIS — Z1231 Encounter for screening mammogram for malignant neoplasm of breast: Secondary | ICD-10-CM

## 2022-06-20 ENCOUNTER — Other Ambulatory Visit: Payer: Self-pay | Admitting: Physician Assistant

## 2022-06-20 ENCOUNTER — Other Ambulatory Visit: Payer: Self-pay | Admitting: Psychiatry

## 2022-06-20 DIAGNOSIS — F411 Generalized anxiety disorder: Secondary | ICD-10-CM

## 2022-06-20 DIAGNOSIS — F200 Paranoid schizophrenia: Secondary | ICD-10-CM

## 2022-06-23 ENCOUNTER — Encounter: Payer: Self-pay | Admitting: Physician Assistant

## 2022-06-23 ENCOUNTER — Ambulatory Visit (INDEPENDENT_AMBULATORY_CARE_PROVIDER_SITE_OTHER): Payer: Medicare Other | Admitting: Physician Assistant

## 2022-06-23 VITALS — BP 120/78 | HR 100 | Ht 61.0 in | Wt 125.0 lb

## 2022-06-23 DIAGNOSIS — K5900 Constipation, unspecified: Secondary | ICD-10-CM

## 2022-06-23 DIAGNOSIS — K219 Gastro-esophageal reflux disease without esophagitis: Secondary | ICD-10-CM

## 2022-06-23 MED ORDER — LINACLOTIDE 72 MCG PO CAPS: 72.0000 ug | ORAL_CAPSULE | Freq: Every day | ORAL | 3 refills | Status: DC

## 2022-06-23 NOTE — Patient Instructions (Addendum)
_______________________________________________________  If your blood pressure at your visit was 140/90 or greater, please contact your primary care physician to follow up on this.  _______________________________________________________  If you are age 70 or older, your body mass index should be between 23-30. Your Body mass index is 23.62 kg/m. If this is out of the aforementioned range listed, please consider follow up with your Primary Care Provider.  If you are age 69 or younger, your body mass index should be between 19-25. Your Body mass index is 23.62 kg/m. If this is out of the aformentioned range listed, please consider follow up with your Primary Care Provider.   ________________________________________________________  The [place] [facility] providers would like to encourage you to use [facility] to communicate with providers for non-urgent requests or questions.  Due to long hold times on the telephone, sending your provider a message by [facility] may be a faster and more efficient way to get a response.  Please allow 48 business hours for a response.  Please remember that this is for non-urgent requests.  _______________________________________________________  We have sent the following medications to your pharmacy for you to pick up at your convenience: Linzess 72   Continue Famotidine 10 mg over the counter every morning if having a flare up take up to 40 mg.  Stop eating and drinking anything other han water after 7:30 pm.

## 2022-06-23 NOTE — Progress Notes (Signed)
Chief Complaint: Follow-up constipation and GERD  HPI:     Nancy Brooks is a 72 year old Caucasian female with a past medical history as listed below including schizophrenia, known to Dr. Havery Moros, who returns to clinic today for follow-up of constipation and reflux.     10/08/2015 colonoscopy with nonthrombosed external hemorrhoids, 1 8 mm polyp in the ascending colon, 1 6 mm polyp in the transverse colon, diverticulosis and nonbleeding internal hemorrhoids.  Non-thrombosed external hemorrhoids found on perianal exam.  Pathology revealed sessile serrated polyps and repeat was recommended in 5 years.     04/11/2019 patient seen in office by me and discussed constipation.  Started on Linzess 72 mcg daily.    05/29/2019 patient seen in clinic at that time was doing well with once daily dosing of MiraLAX in addition to Linzess 72 mcg daily.  At that time discussed that they could increase MiraLAX during times of constipation and continued Linzess 72 mcg if she continues to do fairly well on this.  Was noted that her next colonoscopy was due in June 2022.    11/12/2020 office visit with me to discuss that her Linzess and some MiraLAX worked fairly well for constipation.  At that time refill Linzess 72 mcg and scheduled her for a surveillance colonoscopy.  Discussed titration of MiraLAX.    05/31/2021 colonoscopy for family history of colon cancer and a personal history of colon polyps with one 3 to 4 mm polyp in the ascending colon, one 8 mm polyp in the hepatic flexure and 2 to 3-6 mm polyps in the sigmoid colon, internal hemorrhoids.  Apology showed adenomas and repeat recommended in 3 years.    Today, the patient returns to clinic accompanied by her sister who does help take care of her.  She would like a refill of the Linzess, she takes 72 mcg once in the morning as well as half dose of MiraLAX and this seems to keep her fairly regular.  Also eats a few prunes.  Denies any incontinence or other issues at this  point.    Sister is worried a little bit about some breakthrough reflux symptoms that occur minimally may be a few times a week and otherwise she will have a couple of bad episodes that wake her from her sleep about once a month and last for about 2 hours at a time.  Currently she is only using Famotidine 10 mg every other day.    Denies fever, chills, weight loss, blood in her stool, nausea or vomiting.  Past Medical History:  Diagnosis Date   Anxiety    BURSITIS, RIGHT KNEE    Depression    Dyskinesia of esophagus    GAIT DISTURBANCE    GERD    HEMORRHOIDS, NOS    HYPERLIPIDEMIA    MENOPAUSAL SYNDROME    Osteoporosis dx 03/2011   DEXA at gyn, started boniva in additon to Ca+D, changed to Prolia 09/2012   SCHIZOPHRENIA    SYMPTOM, INCONTINENCE, URGE     Past Surgical History:  Procedure Laterality Date   ARTHRODESIS METATARSAL Left 01/29/2021   Procedure: ARTHRODESIS TARSOMETATARSAL LEFT FOOT;  Surgeon: [name], DPM;  Location: [facility];  Service: Podiatry;  Laterality: Left;  GENERAL WITH BLOCK   BREAST SURGERY  05/09/1998   left breast biopsy   CLOSED REDUCTION METATARSAL Left 12/11/2020   Procedure: CLOSED REDUCTION METATARSAL;  Surgeon: [name], DPM;  Location: [facility];  Service: Podiatry;  Laterality: Left;  reduction and  pinning (2.52m Steinmann pins)   COLONOSCOPY  03/16/2010   also 2017    Current Outpatient Medications  Medication Sig Dispense Refill   calcium elemental as carbonate (BARIATRIC TUMS ULTRA) 400 MG chewable tablet Chew 1,000 mg by mouth daily.     chlordiazePOXIDE (LIBRIUM) 10 MG capsule TAKE ONE CAPSULE BY MOUTH EVERY MORNING AND TAKE ONE CAPSULE BY MOUTH AT BEDTIME 60 capsule 5   cholecalciferol (VITAMIN D3) 25 MCG (1000 UNIT) tablet Take 1,000 Units by mouth daily.     cloZAPine (CLOZARIL) 100 MG tablet TAKE 1 TABLET BY MOUTH EVERY MORNING AND 4 TABLETS EVERY EVENING 140 tablet 11   famotidine (PEPCID) 10 MG tablet Take 10 mg by mouth  daily.      ibandronate (BONIVA) 150 MG tablet Take 150 mg by mouth every 30 (thirty) days. Take in the morning with a full glass of water, on an empty stomach, and do not take anything else by mouth or lie down for the next 30 min.     LINZESS 72 MCG capsule TAKE ONE CAPSULE BY MOUTH DAILY 30 capsule 0   polyethylene glycol (MIRALAX / GLYCOLAX) 17 g packet Take 17 g by mouth daily.     simvastatin (ZOCOR) 40 MG tablet TAKE 1 TABLET(40 MG) BY MOUTH DAILY AT 6 PM 90 tablet 3   vitamin B-12 (CYANOCOBALAMIN) 1000 MCG tablet Take 1 tablet (1,000 mcg total) by mouth daily. 90 tablet 3   No current facility-administered medications for this visit.    Allergies as of 06/23/2022   (No Known Allergies)    Family History  Problem Relation Age of Onset   Hypertension Mother    Depression Mother    Prostate cancer Father    Colon cancer Brother 460  Esophageal cancer Neg Hx    Pancreatic cancer Neg Hx    Rectal cancer Neg Hx    Stomach cancer Neg Hx     Social History   Socioeconomic History   Marital status: Single    Spouse name: Not on file   Number of children: Not on file   Years of education: Not on file   Highest education level: Not on file  Occupational History   Not on file  Tobacco Use   Smoking status: Never   Smokeless tobacco: Never  Vaping Use   Vaping Use: Never used  Substance and Sexual Activity   Alcohol use: No    Alcohol/week: 0.0 standard drinks of alcohol   Drug use: No   Sexual activity: Not on file  Other Topics Concern   Not on file  Social History Narrative   Lives alone in single family home. Sister provides support and come to visits. Pt drives and indep ADLs.    Social Determinants of Health   Financial Resource Strain: Low Risk  (05/24/2021)   Overall Financial Resource Strain (CARDIA)    Difficulty of Paying Living Expenses: Not hard at all  Food Insecurity: No Food Insecurity (05/24/2021)   Hunger Vital Sign    Worried About Running Out of  Food in the Last Year: Never true    Ran Out of Food in the Last Year: Never true  Transportation Needs: No Transportation Needs (05/24/2021)   PRAPARE - THydrologist(Medical): No    Lack of Transportation (Non-Medical): No  Physical Activity: Insufficiently Active (05/24/2021)   Exercise Vital Sign    Days of Exercise per Week: 5 days    Minutes of  Exercise per Session: 20 min  Stress: No Stress Concern Present (05/24/2021)   [place]    Feeling of Stress : Not at all  Social Connections: Socially Isolated (05/24/2021)   Social Connection and Isolation Panel [NHANES]    Frequency of Communication with Friends and Family: Twice a week    Frequency of Social Gatherings with Friends and Family: Twice a week    Attends Religious Services: Never    Marine scientist or Organizations: No    Attends Archivist Meetings: Never    Marital Status: Never married  Intimate Partner Violence: Not At Risk (05/24/2021)   Humiliation, Afraid, Rape, and Kick questionnaire    Fear of Current or Ex-Partner: No    Emotionally Abused: No    Physically Abused: No    Sexually Abused: No    Review of Systems:    Constitutional: No weight loss, fever or chills Cardiovascular: No chest pain  Respiratory: No SOB  Gastrointestinal: See HPI and otherwise negative   Physical Exam:  Vital signs: BP 120/78   Pulse 100   Ht 5' 1"$  (1.549 m)   Wt 125 lb (56.7 kg)   SpO2 95%   BMI 23.62 kg/m    Constitutional:   Pleasant chronically ill appearing Caucasian female appears to be in NAD, Well developed, Well nourished, alert and cooperative Respiratory: Respirations even and unlabored. Lungs clear to auscultation bilaterally.   No wheezes, crackles, or rhonchi.  Cardiovascular: Normal S1, S2. No MRG. Regular rate and rhythm. No peripheral edema, cyanosis or pallor.  Gastrointestinal:  Soft,  nondistended, nontender. No rebound or guarding. Normal bowel sounds. No appreciable masses or hepatomegaly. Rectal:  Not performed.  Psychiatric:Demonstrates good judgement and reason without abnormal affect or behaviors. +flat affect  RELEVANT LABS AND IMAGING: CBC    Component Value Date/Time   WBC 6.2 10/06/2021 1040   WBC 7.2 08/03/2021 1458   RBC 4.49 10/06/2021 1040   RBC 4.23 08/03/2021 1458   HGB 13.1 10/06/2021 1040   HCT 39.0 10/06/2021 1040   PLT 235 10/06/2021 1040   MCV 87 10/06/2021 1040   MCH 29.2 10/06/2021 1040   MCH 28.8 02/02/2021 0458   MCHC 33.6 10/06/2021 1040   MCHC 33.6 08/03/2021 1458   RDW 13.5 10/06/2021 1040   LYMPHSABS 1.3 10/06/2021 1040   MONOABS 0.2 01/30/2021 0411   EOSABS 0.0 10/06/2021 1040   BASOSABS 0.0 10/06/2021 1040    CMP     Component Value Date/Time   NA 137 08/03/2021 1458   K 4.2 08/03/2021 1458   CL 100 08/03/2021 1458   CO2 30 08/03/2021 1458   GLUCOSE 76 08/03/2021 1458   BUN 20 08/03/2021 1458   CREATININE 0.82 08/03/2021 1458   CALCIUM 8.8 08/03/2021 1458   PROT 6.8 08/03/2021 1458   ALBUMIN 4.2 08/03/2021 1458   AST 15 08/03/2021 1458   ALT 14 08/03/2021 1458   ALKPHOS 119 (H) 08/03/2021 1458   BILITOT 0.2 08/03/2021 1458   GFRNONAA >60 01/30/2021 0411    Assessment: 1.  Chronic constipation: Better with Linzess 72 mcg a day and half a dose of MiraLAX, up-to-date on colonoscopy with her last 05/31/2021 and recommendations to repeat in 3 years 2.  GERD: Some breakthrough symptoms on Famotidine 10 mg every other day  Plan: 1.  Would recommend that we increase Famotidine to 10 mg every morning.  If she does have breakthrough symptoms that hit her  in the evening she can take up to 40 mg as needed at that point. 2.  Refilled Linzess 72 mcg daily, 30 to 60 minutes before breakfast.  #90 with 3 refills. 3.  Continue a half a dose of MiraLAX daily 4.  Discussed that the patient should try and stop eating or drinking  anything other than water after 7:30 at night. 5.  Patient's next colonoscopy is due 05/31/2024 6.  Patient to follow in clinic in a year or sooner if needed.  Ellouise Newer, PA-C [facility] 06/23/2022, 2:39 PM  Cc: Hoyt Koch, *

## 2022-06-24 NOTE — Progress Notes (Signed)
Agree with assessment and plan as outlined.  

## 2022-07-14 ENCOUNTER — Other Ambulatory Visit: Payer: Self-pay | Admitting: Psychiatry

## 2022-07-14 ENCOUNTER — Encounter: Payer: Self-pay | Admitting: Psychiatry

## 2022-07-14 DIAGNOSIS — F200 Paranoid schizophrenia: Secondary | ICD-10-CM

## 2022-07-26 ENCOUNTER — Encounter: Payer: Self-pay | Admitting: Psychiatry

## 2022-07-26 ENCOUNTER — Ambulatory Visit (INDEPENDENT_AMBULATORY_CARE_PROVIDER_SITE_OTHER): Payer: Medicare Other | Admitting: Psychiatry

## 2022-07-26 ENCOUNTER — Telehealth: Payer: Self-pay

## 2022-07-26 DIAGNOSIS — K5903 Drug induced constipation: Secondary | ICD-10-CM

## 2022-07-26 DIAGNOSIS — Z79899 Other long term (current) drug therapy: Secondary | ICD-10-CM

## 2022-07-26 DIAGNOSIS — F411 Generalized anxiety disorder: Secondary | ICD-10-CM | POA: Diagnosis not present

## 2022-07-26 DIAGNOSIS — F200 Paranoid schizophrenia: Secondary | ICD-10-CM

## 2022-07-26 MED ORDER — CLONAZEPAM 1 MG PO TABS: ORAL_TABLET | ORAL | 2 refills | Status: DC
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
## 2022-07-26 NOTE — Telephone Encounter (Signed)
Called patient to schedule Medicare Annual Wellness Visit (AWV). Unable to reach patient.  Last date of AWV: 05/24/21  Please schedule an appointment at any time with [facility].   Norton Blizzard, [place] (AAMA)  [place] [facility] [phone]

## 2022-08-02 ENCOUNTER — Ambulatory Visit
Admission: RE | Admit: 2022-08-02 | Discharge: 2022-08-02 | Disposition: A | Discharge status: Home / Self Care | Payer: Medicare Other | Source: Ambulatory Visit | Attending: Obstetrics and Gynecology | Admitting: Obstetrics and Gynecology

## 2022-08-02 DIAGNOSIS — Z1231 Encounter for screening mammogram for malignant neoplasm of breast: Secondary | ICD-10-CM

## 2022-09-08 ENCOUNTER — Encounter: Payer: Self-pay | Admitting: Psychiatry

## 2022-09-19 ENCOUNTER — Encounter: Payer: Self-pay | Admitting: Internal Medicine

## 2022-09-19 ENCOUNTER — Other Ambulatory Visit: Payer: Self-pay

## 2022-09-19 ENCOUNTER — Ambulatory Visit (INDEPENDENT_AMBULATORY_CARE_PROVIDER_SITE_OTHER): Payer: Medicare Other | Admitting: Internal Medicine

## 2022-09-19 VITALS — BP 116/70 | HR 119 | Temp 98.0°F | Ht 61.0 in | Wt 126.0 lb

## 2022-09-19 DIAGNOSIS — R202 Paresthesia of skin: Secondary | ICD-10-CM | POA: Diagnosis not present

## 2022-09-19 DIAGNOSIS — K5904 Chronic idiopathic constipation: Secondary | ICD-10-CM | POA: Diagnosis not present

## 2022-09-19 DIAGNOSIS — E611 Iron deficiency: Secondary | ICD-10-CM | POA: Diagnosis not present

## 2022-09-19 DIAGNOSIS — G2581 Restless legs syndrome: Secondary | ICD-10-CM

## 2022-09-19 DIAGNOSIS — Z0000 Encounter for general adult medical examination without abnormal findings: Secondary | ICD-10-CM | POA: Diagnosis not present

## 2022-09-19 DIAGNOSIS — M810 Age-related osteoporosis without current pathological fracture: Secondary | ICD-10-CM

## 2022-09-19 DIAGNOSIS — E538 Deficiency of other specified B group vitamins: Secondary | ICD-10-CM

## 2022-09-19 DIAGNOSIS — E785 Hyperlipidemia, unspecified: Secondary | ICD-10-CM

## 2022-09-19 DIAGNOSIS — K219 Gastro-esophageal reflux disease without esophagitis: Secondary | ICD-10-CM | POA: Diagnosis not present

## 2022-09-19 DIAGNOSIS — F209 Schizophrenia, unspecified: Secondary | ICD-10-CM

## 2022-09-19 LAB — COMPREHENSIVE METABOLIC PANEL
ALT: 22 U/L (ref 0–35)
AST: 20 U/L (ref 0–37)
Albumin: 4.2 g/dL (ref 3.5–5.2)
Alkaline Phosphatase: 95 U/L (ref 39–117)
BUN: 25 mg/dL — ABNORMAL HIGH (ref 6–23)
CO2: 31 mEq/L (ref 19–32)
Calcium: 9 mg/dL (ref 8.4–10.5)
Chloride: 102 mEq/L (ref 96–112)
Creatinine, Ser: 0.93 mg/dL (ref 0.40–1.20)
GFR: 62.67 mL/min (ref >60.00)
Glucose, Bld: 95 mg/dL (ref 70–99)
Potassium: 4.3 mEq/L (ref 3.5–5.1)
Sodium: 142 mEq/L (ref 135–145)
Total Bilirubin: 0.3 mg/dL (ref 0.2–1.2)
Total Protein: 6.4 g/dL (ref 6.0–8.3)

## 2022-09-19 LAB — LIPID PANEL
Cholesterol: 171 mg/dL (ref 0–200)
HDL: 44.6 mg/dL (ref >39.00)
Total CHOL/HDL Ratio: 4
Triglycerides: 505 mg/dL — ABNORMAL HIGH (ref 0.0–149.0)

## 2022-09-19 LAB — LDL CHOLESTEROL, DIRECT: Direct LDL: 87 mg/dL

## 2022-09-19 LAB — VITAMIN B12: Vitamin B-12: >1500 pg/mL — ABNORMAL HIGH (ref 211–911)

## 2022-09-19 LAB — FERRITIN: Ferritin: 39 ng/mL (ref 10.0–291.0)

## 2022-09-19 LAB — TSH: TSH: 3.46 u[IU]/mL (ref 0.35–5.50)

## 2022-09-19 MED ORDER — SIMVASTATIN 40 MG PO TABS: ORAL_TABLET | ORAL | 3 refills | Status: DC

## 2022-09-19 MED ORDER — ROPINIROLE HCL 0.25 MG PO TABS: 0.2500 mg | ORAL_TABLET | Freq: Every day | ORAL | 1 refills | Status: DC

## 2022-09-19 MED ORDER — VITAMIN B-12 1000 MCG PO TABS
1000.0000 ug | ORAL_TABLET | Freq: Every day | ORAL | 3 refills | Status: AC
Start: 2022-09-19 — End: ?

## 2022-09-19 MED ORDER — VITAMIN B-12 1000 MCG PO TABS: 1000.0000 ug | ORAL_TABLET | Freq: Every day | ORAL | 3 refills | Status: DC

## 2022-09-19 NOTE — Assessment & Plan Note (Signed)
Flu shot yearly. Pneumonia complete. Shingrix complete. Tetanus due 2026. Colonoscopy due 2026. Mammogram due 2025, pap smear aged out and dexa plan for 2025-2026. Counseled about sun safety and mole surveillance. Counseled about the dangers of distracted driving. Given 10 year screening recommendations.

## 2022-09-19 NOTE — Assessment & Plan Note (Signed)
Stable, seeing psych for management and continues on clozaril and clonazepam.

## 2022-09-19 NOTE — Patient Instructions (Addendum)
We will have you stop the boniva and we will check the bone density in 1-2 years.   We have sent in requip to start at night time to help the restless leg.

## 2022-09-19 NOTE — Assessment & Plan Note (Signed)
Some tingling and restlessness in the legs at night time. Checking ferritin, TSH, B12 levels today and adjust as needed. Rx requip 0.25 mg daily.

## 2022-09-19 NOTE — Assessment & Plan Note (Signed)
Taking pepcid otc currently and good control.

## 2022-09-19 NOTE — Assessment & Plan Note (Signed)
Stop boniva and recheck DEXA 1-2 years.

## 2022-09-19 NOTE — Assessment & Plan Note (Signed)
Checking lipid panel and CMP and adjust simvastatin 40 mg daily as needed.

## 2022-09-19 NOTE — Assessment & Plan Note (Signed)
Advised we can fill linzess if needed and she can just return to GI prn. She will continue on otc and linzess 72 mcg daily.

## 2022-09-19 NOTE — Progress Notes (Signed)
Subjective:   Patient ID: Nancy Brooks, female    DOB: 1951-05-02, 72 y.o.   MRN: [id number]  HPI Here for medicare wellness and follow up, with new complaints. Please see A/P for status and treatment of chronic medical problems.   Diet: heart healthy Physical activity: sedentary, walks some Depression/mood screen: negative Hearing: intact to whispered voice Visual acuity: grossly normal, performs annual eye exam  ADLs: capable Fall risk: none Home safety: good Cognitive evaluation: intact to orientation, naming, recall and repetition EOL planning: adv directives discussed  Flowsheet Row Office Visit from 09/19/2022 in [facility] [place]  PHQ-2 Total Score 0       Flowsheet Row Office Visit from 09/19/2022 in [facility]  PHQ-9 Total Score 0         02/05/2021    8:10 PM 02/06/2021    8:00 AM 05/24/2021    1:53 PM 07/01/2021   10:45 AM 09/19/2022    3:19 PM  Fall Risk  Falls in the past year?   0 0 1  Was there an injury with Fall?   0 0 0  Fall Risk Category Calculator   0 0 1  Fall Risk Category (Retired)   Low Low   (RETIRED) Patient Fall Risk Level High fall risk High fall risk Low fall risk    Fall risk Follow up   Falls evaluation completed Falls evaluation completed Falls evaluation completed   I have personally reviewed and have noted 1. The patient's medical and social history - reviewed today no changes 2. Their use of alcohol, tobacco or illicit drugs 3. Their current medications and supplements 4. The patient's functional ability including ADL's, fall risks, home safety risks and hearing or visual impairment. 5. Diet and physical activities 6. Evidence for depression or mood disorders 7. Care team reviewed and updated 8.  The patient is not on an opioid pain medication.  Patient Care Team: Myrlene Broker, MD as PCP - General (Internal Medicine) Hart Carwin, MD (Inactive) as  Consulting Physician (Gastroenterology) Cottle, Steva Ready., MD as Consulting Physician (Psychiatry) Zelphia Cairo, MD (Obstetrics and Gynecology) Past Medical History:  Diagnosis Date   Anxiety    BURSITIS, RIGHT KNEE    Depression    Dyskinesia of esophagus    GAIT DISTURBANCE    GERD    HEMORRHOIDS, NOS    HYPERLIPIDEMIA    MENOPAUSAL SYNDROME    Osteoporosis dx 03/2011   DEXA at gyn, started boniva in additon to Ca+D, changed to Prolia 09/2012   SCHIZOPHRENIA    SYMPTOM, INCONTINENCE, URGE    Past Surgical History:  Procedure Laterality Date   ARTHRODESIS METATARSAL Left 01/29/2021   Procedure: ARTHRODESIS TARSOMETATARSAL LEFT FOOT;  Surgeon: Edwin Cap, DPM;  Location: [facility];  Service: Podiatry;  Laterality: Left;  GENERAL WITH BLOCK   BREAST SURGERY  05/09/1998   left breast biopsy   CLOSED REDUCTION METATARSAL Left 12/11/2020   Procedure: CLOSED REDUCTION METATARSAL;  Surgeon: Edwin Cap, DPM;  Location: [facility];  Service: Podiatry;  Laterality: Left;  reduction and pinning (2.4mm Steinmann pins)   COLONOSCOPY  03/16/2010   also 2017   Family History  Problem Relation Age of Onset   Hypertension Mother    Depression Mother    Prostate cancer Father    Colon cancer Brother 37   Esophageal cancer Neg Hx    Pancreatic cancer Neg Hx  Rectal cancer Neg Hx    Stomach cancer Neg Hx    Review of Systems  Constitutional: Negative.   HENT: Negative.    Eyes: Negative.   Respiratory:  Negative for cough, chest tightness and shortness of breath.   Cardiovascular:  Negative for chest pain, palpitations and leg swelling.  Gastrointestinal:  Negative for abdominal distention, abdominal pain, constipation, diarrhea, nausea and vomiting.  Musculoskeletal: Negative.   Skin: Negative.   Neurological:  Positive for numbness.    Objective:  Physical Exam Constitutional:      Appearance: She is well-developed.  HENT:     Head: Normocephalic and atraumatic.   Cardiovascular:     Rate and Rhythm: Normal rate and regular rhythm.  Pulmonary:     Effort: Pulmonary effort is normal. No respiratory distress.     Breath sounds: Normal breath sounds. No wheezing or rales.  Abdominal:     General: Bowel sounds are normal. There is no distension.     Palpations: Abdomen is soft.     Tenderness: There is no abdominal tenderness. There is no rebound.  Musculoskeletal:     Cervical back: Normal range of motion.  Skin:    General: Skin is warm and dry.     Comments: Several moles examined on face benign  Neurological:     Mental Status: She is alert and oriented to person, place, and time.     Coordination: Coordination normal.     Vitals:   09/19/22 1516  BP: 116/70  Pulse: (!) 119  Temp: 98 F (36.7 C)  TempSrc: Oral  SpO2: 94%  Weight: 126 lb (57.2 kg)  Height: 5\' 1"  (1.549 m)    Assessment & Plan:

## 2022-09-19 NOTE — Assessment & Plan Note (Signed)
Checking b12 level and some new tingling in feet.

## 2022-09-20 ENCOUNTER — Encounter: Payer: Self-pay | Admitting: Psychiatry

## 2022-09-20 ENCOUNTER — Ambulatory Visit (INDEPENDENT_AMBULATORY_CARE_PROVIDER_SITE_OTHER): Payer: Medicare Other | Admitting: Psychiatry

## 2022-09-20 DIAGNOSIS — F411 Generalized anxiety disorder: Secondary | ICD-10-CM | POA: Diagnosis not present

## 2022-09-20 DIAGNOSIS — Z79899 Other long term (current) drug therapy: Secondary | ICD-10-CM

## 2022-09-20 DIAGNOSIS — K5903 Drug induced constipation: Secondary | ICD-10-CM | POA: Diagnosis not present

## 2022-09-20 DIAGNOSIS — F200 Paranoid schizophrenia: Secondary | ICD-10-CM | POA: Diagnosis not present

## 2022-09-20 MED ORDER — CLONAZEPAM 1 MG PO TABS
ORAL_TABLET | ORAL | 5 refills | Status: DC
Start: 2022-09-20 — End: 2023-03-07
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
## 2022-10-05 ENCOUNTER — Telehealth: Payer: Self-pay | Admitting: Internal Medicine

## 2022-10-05 ENCOUNTER — Telehealth: Payer: Self-pay | Admitting: Psychiatry

## 2022-10-05 NOTE — Telephone Encounter (Signed)
Received DMV Form to be completed for [facility]. Placed in Traci's box to complete.

## 2022-10-05 NOTE — Telephone Encounter (Signed)
Placed inside Dr Okey Dupre office box

## 2022-10-05 NOTE — Telephone Encounter (Signed)
Pt's sister [name] dropped off from [facility] for Dr. Okey Dupre to fill out.   She is aware that this process might take 7-10 Business days.   Please call her @ [phone] when ready to pick up.

## 2022-10-06 ENCOUNTER — Encounter: Payer: Self-pay | Admitting: Psychiatry

## 2022-10-09 NOTE — Telephone Encounter (Signed)
Working on form and will finish with Dr. Jennelle Human and get a signature.

## 2022-10-10 DIAGNOSIS — Z0289 Encounter for other administrative examinations: Secondary | ICD-10-CM

## 2022-10-10 NOTE — Telephone Encounter (Signed)
In review of form I do not see all the pages of the form. Do they have more pages to bring to [place]?

## 2022-10-13 NOTE — Telephone Encounter (Addendum)
Pt states that there are two forms that will be missing due to her having her eye doctor and psychiatrist signing them as well.  She will be all of the forms to [place] asap.

## 2022-10-13 NOTE — Telephone Encounter (Signed)
Forms have been dropped off at the front. They will be in the providers box up front. Request a call when everything is completed or if there are any questions at [phone].

## 2022-10-14 NOTE — Telephone Encounter (Signed)
Placed inside Dr Crawfords office box 

## 2022-10-17 NOTE — Telephone Encounter (Signed)
Sister, [name], called at 11:30 to check status of the DMV form.  Please let her know when it is ready.  She will pick up.

## 2022-10-17 NOTE — Telephone Encounter (Signed)
Checking on status of DMV form.

## 2022-10-17 NOTE — Telephone Encounter (Signed)
Dr. Jennelle Human completed that form.

## 2022-10-17 NOTE — Telephone Encounter (Signed)
Called and sent my chart message for patient to pick up forms

## 2022-10-17 NOTE — Telephone Encounter (Signed)
I see a DMV form scanned into media, but I don't see Dr. Alwyn Ren signature on it. Maybe another form was done and hasn't been scanned in yet.

## 2022-10-17 NOTE — Telephone Encounter (Signed)
I have filled this out. She will just need to attach pages from eye doctor and [facility] doctor and send in.

## 2022-10-20 NOTE — Telephone Encounter (Signed)
Completed form in office box

## 2022-11-03 ENCOUNTER — Encounter: Payer: Self-pay | Admitting: Psychiatry

## 2022-11-24 ENCOUNTER — Encounter: Payer: Self-pay | Admitting: Psychiatry

## 2022-11-24 ENCOUNTER — Ambulatory Visit (INDEPENDENT_AMBULATORY_CARE_PROVIDER_SITE_OTHER): Payer: Medicare Other | Admitting: Psychiatry

## 2022-11-24 DIAGNOSIS — G2581 Restless legs syndrome: Secondary | ICD-10-CM

## 2022-11-24 DIAGNOSIS — K5903 Drug induced constipation: Secondary | ICD-10-CM | POA: Diagnosis not present

## 2022-11-24 DIAGNOSIS — F200 Paranoid schizophrenia: Secondary | ICD-10-CM | POA: Diagnosis not present

## 2022-11-24 DIAGNOSIS — F411 Generalized anxiety disorder: Secondary | ICD-10-CM | POA: Diagnosis not present

## 2022-11-24 DIAGNOSIS — Z79899 Other long term (current) drug therapy: Secondary | ICD-10-CM
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
## 2022-12-29 ENCOUNTER — Encounter: Payer: Self-pay | Admitting: Psychiatry

## 2023-01-12 ENCOUNTER — Ambulatory Visit (INDEPENDENT_AMBULATORY_CARE_PROVIDER_SITE_OTHER): Payer: Medicare Other | Admitting: Psychiatry

## 2023-01-12 ENCOUNTER — Encounter: Payer: Self-pay | Admitting: Psychiatry

## 2023-01-12 DIAGNOSIS — Z79899 Other long term (current) drug therapy: Secondary | ICD-10-CM

## 2023-01-12 DIAGNOSIS — F200 Paranoid schizophrenia: Secondary | ICD-10-CM | POA: Diagnosis not present

## 2023-01-12 DIAGNOSIS — F411 Generalized anxiety disorder: Secondary | ICD-10-CM | POA: Diagnosis not present

## 2023-01-12 DIAGNOSIS — K5903 Drug induced constipation: Secondary | ICD-10-CM

## 2023-01-12 DIAGNOSIS — G2581 Restless legs syndrome: Secondary | ICD-10-CM

## 2023-01-12 NOTE — Patient Instructions (Signed)
See regular doctor about change in skin color on your face.

## 2023-01-12 NOTE — Progress Notes (Signed)
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 been considering.  Therefore continue the current medications and I do not want to make any changes.          Nancy Brooks' # [phone].    See regular doctor about change in skin color on your face.  Disc this.    No med changes today.     FU every 8 weeks  Meredith Staggers, MD, DFAPA   Please see After Visit Summary for patient specific instructions.  No future appointments.     No orders of the defined types were placed in this encounter.      -------------------------------

## 2023-01-17 ENCOUNTER — Ambulatory Visit: Payer: Medicare Other | Admitting: Internal Medicine

## 2023-01-24 ENCOUNTER — Ambulatory Visit (INDEPENDENT_AMBULATORY_CARE_PROVIDER_SITE_OTHER): Payer: Medicare Other | Admitting: Internal Medicine

## 2023-01-24 VITALS — BP 98/60 | HR 114 | Temp 98.5°F | Ht 61.0 in | Wt 135.0 lb

## 2023-01-24 DIAGNOSIS — L819 Disorder of pigmentation, unspecified: Secondary | ICD-10-CM

## 2023-01-24 NOTE — Progress Notes (Unsigned)
Subjective:   Patient ID: Nancy Brooks, female    DOB: 08-27-1950, 72 y.o.   MRN: [id number]  HPI The patient is a 72 YO female coming in for discolored skin of the face which is darker/grey tone. Her psychiatrist had asked her to get assessed by [place] for this.  Review of Systems  Constitutional: Negative.   HENT: Negative.    Eyes: Negative.   Respiratory:  Negative for cough, chest tightness and shortness of breath.   Cardiovascular:  Negative for chest pain, palpitations and leg swelling.  Gastrointestinal:  Negative for abdominal distention, abdominal pain, constipation, diarrhea, nausea and vomiting.  Musculoskeletal: Negative.   Skin:  Positive for color change.  Neurological: Negative.   Psychiatric/Behavioral: Negative.      Objective:  Physical Exam Constitutional:      Appearance: She is well-developed.  HENT:     Head: Normocephalic and atraumatic.  Cardiovascular:     Rate and Rhythm: Normal rate and regular rhythm.  Pulmonary:     Effort: Pulmonary effort is normal. No respiratory distress.     Breath sounds: Normal breath sounds. No wheezing or rales.  Abdominal:     General: Bowel sounds are normal. There is no distension.     Palpations: Abdomen is soft.     Tenderness: There is no abdominal tenderness. There is no rebound.  Musculoskeletal:     Cervical back: Normal range of motion.  Skin:    General: Skin is warm and dry.     Comments: Face and neck is grayer in tone than surrounding skin. Normal cap refill in face and neck as well as in hands/feet. Good peripheral pulses.   Neurological:     Mental Status: She is alert and oriented to person, place, and time.     Coordination: Coordination normal.     Vitals:   01/24/23 1506  BP: 98/60  Pulse: (!) 114  Temp: 98.5 F (36.9 C)  TempSrc: Oral  SpO2: 91%  Weight: 135 lb (61.2 kg)  Height: 5\' 1"  (1.549 m)    Assessment & Plan:

## 2023-01-24 NOTE — Patient Instructions (Signed)
Capsaicin cream you can try on the legs.

## 2023-01-25 ENCOUNTER — Encounter: Payer: Self-pay | Admitting: Internal Medicine

## 2023-01-25 DIAGNOSIS — L819 Disorder of pigmentation, unspecified: Secondary | ICD-10-CM | POA: Insufficient documentation

## 2023-01-25 NOTE — Assessment & Plan Note (Signed)
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 She has normal Hg counts arguing against chronic hypoxia. She has normal CO2 levels also suggesting no chronic hypoxia. I do not recommend further testing. Given the stability of her mental health I do not recommend change to her regimen and patient and sister agree with that. Will forward note to her psychiatrist per their request.

## 2023-01-26 ENCOUNTER — Encounter: Payer: Self-pay | Admitting: Psychiatry

## 2023-02-23 ENCOUNTER — Encounter: Payer: Self-pay | Admitting: Psychiatry

## 2023-02-27 ENCOUNTER — Telehealth: Payer: Self-pay | Admitting: Psychiatry

## 2023-02-27 NOTE — Telephone Encounter (Signed)
Received  a letter for dmv from sister janet.

## 2023-03-07 ENCOUNTER — Encounter: Payer: Self-pay | Admitting: Psychiatry

## 2023-03-07 ENCOUNTER — Ambulatory Visit (INDEPENDENT_AMBULATORY_CARE_PROVIDER_SITE_OTHER): Payer: Medicare Other | Admitting: Psychiatry

## 2023-03-07 DIAGNOSIS — F411 Generalized anxiety disorder: Secondary | ICD-10-CM

## 2023-03-07 DIAGNOSIS — Z79899 Other long term (current) drug therapy: Secondary | ICD-10-CM

## 2023-03-07 DIAGNOSIS — G2581 Restless legs syndrome: Secondary | ICD-10-CM | POA: Diagnosis not present

## 2023-03-07 DIAGNOSIS — K5903 Drug induced constipation: Secondary | ICD-10-CM

## 2023-03-07 DIAGNOSIS — F200 Paranoid schizophrenia: Secondary | ICD-10-CM

## 2023-03-07 MED ORDER — CLONAZEPAM 0.5 MG PO TABS
ORAL_TABLET | ORAL | 2 refills | Status: DC
Start: 2023-03-07 — End: 2023-05-23

## 2023-03-07 NOTE — Progress Notes (Signed)
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  Concerns about polypharmacy and drug interactions so defer.  Would like a trial of this but concerns she can't tolerate a reduction in clozapine which would likely be necessary. Consider sertraline for intrusive thoughts.    Disc driving issues.  She is chronically anxious and that affects driving confidence.  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  Disc risk of ropinirole and psychosis but risk should be minimal with low dose for RLS.  If doesn't work at low dose consider gabapentin instead.   It is working fine.    02/23/22      Component Ref Range & Units 2 mo ago  Clozapine Lvl 350 - 600 ng/mL 1,542 High   NorClozapine Not Estab. ng/mL 871  Total(Cloz+Norcloz) ng/mL 2,413       Signed      After reviewing the blood level and thinking about the situation, I have decided that it is too risky medically to try the fluvoxamine augmentation that I had been considering.  Therefore continue the current medications and I do not want to make any changes.          Nancy Brooks' # [phone].    See regular doctor about change in skin color on  your face.  Disc this.     FU every 8 weeks  Meredith Staggers, MD, DFAPA   Please see After Visit Summary for patient specific instructions.  No future appointments.     Orders Placed This Encounter  Procedures   CBC with Differential/Platelet       -------------------------------

## 2023-03-07 NOTE — Patient Instructions (Signed)
There is no clear benefit from the higher dose of clonazepam 1mg  so with next refill we will reduce it back to 0.5 mg twice daily bc it can be sedating with higher doses.  It is to help anxiety.

## 2023-03-09 NOTE — Telephone Encounter (Signed)
Nancy Brooks called today wanting to know if the letter is ready. It is due by November 9 th. Please call janet when it is ready for pick up

## 2023-03-10 NOTE — Telephone Encounter (Signed)
Was this given to Dr. Jennelle Human? I do not have this that I am aware of.

## 2023-03-12 ENCOUNTER — Encounter: Payer: Self-pay | Admitting: Internal Medicine

## 2023-03-12 DIAGNOSIS — R29898 Other symptoms and signs involving the musculoskeletal system: Secondary | ICD-10-CM

## 2023-03-12 DIAGNOSIS — R2689 Other abnormalities of gait and mobility: Secondary | ICD-10-CM

## 2023-03-13 ENCOUNTER — Encounter: Payer: Self-pay | Admitting: Internal Medicine

## 2023-03-22 ENCOUNTER — Encounter: Payer: Self-pay | Admitting: Psychiatry

## 2023-03-22 NOTE — Telephone Encounter (Signed)
Form completed.

## 2023-03-23 ENCOUNTER — Encounter: Payer: Self-pay | Admitting: Psychiatry

## 2023-03-23 ENCOUNTER — Encounter: Payer: Self-pay | Admitting: Neurology

## 2023-03-23 ENCOUNTER — Ambulatory Visit: Payer: Medicare Other | Admitting: Internal Medicine

## 2023-03-23 ENCOUNTER — Ambulatory Visit (INDEPENDENT_AMBULATORY_CARE_PROVIDER_SITE_OTHER): Payer: Medicare Other

## 2023-03-23 VITALS — BP 126/80 | HR 124 | Temp 98.0°F | Ht 61.0 in | Wt 132.0 lb

## 2023-03-23 DIAGNOSIS — Z23 Encounter for immunization: Secondary | ICD-10-CM | POA: Diagnosis not present

## 2023-03-23 DIAGNOSIS — R29898 Other symptoms and signs involving the musculoskeletal system: Secondary | ICD-10-CM

## 2023-03-23 NOTE — Progress Notes (Signed)
   Subjective:   Patient ID: Nancy Brooks, female    DOB: 1951-01-05, 72 y.o.   MRN: [id number]  Extremity Weakness  Associated symptoms include numbness.   The patient is a 72 YO female coming in for leg weakness. With walking for some time her legs collapsed on her. Family has noticed shuffling gait. Both MS and supraprogressive palsy in the family. She has had some tingling in her legs for some time which is stable to mild progression. No back pain. No pain with walking. She has had several falls related to this recently. No arm weakness or hand weakness.   Review of Systems  Constitutional: Negative.   HENT: Negative.    Eyes: Negative.   Respiratory:  Negative for cough, chest tightness and shortness of breath.   Cardiovascular:  Negative for chest pain, palpitations and leg swelling.  Gastrointestinal:  Negative for abdominal distention, abdominal pain, constipation, diarrhea, nausea and vomiting.  Musculoskeletal: Negative.   Skin: Negative.   Neurological:  Positive for weakness and numbness.  Psychiatric/Behavioral: Negative.      Objective:  Physical Exam Constitutional:      Appearance: She is well-developed.  HENT:     Head: Normocephalic and atraumatic.  Cardiovascular:     Rate and Rhythm: Normal rate and regular rhythm.  Pulmonary:     Effort: Pulmonary effort is normal. No respiratory distress.     Breath sounds: Normal breath sounds. No wheezing or rales.  Abdominal:     General: Bowel sounds are normal. There is no distension.     Palpations: Abdomen is soft.     Tenderness: There is no abdominal tenderness. There is no rebound.  Musculoskeletal:     Cervical back: Normal range of motion.  Skin:    General: Skin is warm and dry.  Neurological:     Mental Status: She is alert and oriented to person, place, and time.     Motor: Weakness present.     Coordination: Coordination normal.     Comments: 4/5 strength legs bilateral     Vitals:   03/23/23  1058  BP: 126/80  Pulse: (!) 124  Temp: 98 F (36.7 C)  TempSrc: Oral  SpO2: 98%  Weight: 132 lb (59.9 kg)  Height: 5\' 1"  (1.549 m)    Assessment & Plan:  Flu shot given at visit

## 2023-03-23 NOTE — Assessment & Plan Note (Addendum)
Needs to see neurology and referral already placed sister will call tomorrow about scheduling. Nancy Brooks has symptoms which are concerning given her history and family history. We are checking x-ray lumbar today to rule out radiculopathy or lumbar as cause of weakness. She will likely need MRI brain possibly and cervical to help assess cause if lumbar x-ray normal.

## 2023-03-23 NOTE — Patient Instructions (Signed)
We will do the x-ray today and will ask the neurologist if they want [place] to get an MRI before you see them.

## 2023-04-15 ENCOUNTER — Other Ambulatory Visit: Payer: Self-pay | Admitting: Internal Medicine

## 2023-04-20 ENCOUNTER — Encounter: Payer: Self-pay | Admitting: Psychiatry

## 2023-05-11 ENCOUNTER — Encounter: Payer: Self-pay | Admitting: Internal Medicine

## 2023-05-12 ENCOUNTER — Ambulatory Visit: Payer: Self-pay | Admitting: Internal Medicine

## 2023-05-12 MED ORDER — ERYTHROMYCIN 5 MG/GM OP OINT: 1.0000 | TOPICAL_OINTMENT | Freq: Every day | OPHTHALMIC | 0 refills | Status: DC

## 2023-05-12 NOTE — Telephone Encounter (Signed)
 Can do telemedicine visit from [place] with care anytime

## 2023-05-12 NOTE — Progress Notes (Signed)
 Initial neurology clinic note  Reason for Evaluation: Consultation requested by Adelia Homestead, * for an opinion regarding leg weakness and falls. My final recommendations will be communicated back to the requesting physician by way of shared medical record or letter to requesting physician via [place]  mail.  HPI: This is Ms. Nancy Brooks, a 73 y.o. right-handed female with a medical history of HLD, schizophrenia who presents to neurology clinic with the chief complaint of leg weakness and falls. The patient is accompanied by sister who assists with history.  Per sister, patient does not pick up her feet for about 3 years (right more than left). This fall she had more trouble walking. She has had to be helped walking. She has fallen multiple times, maybe a couple of times per month per patient. She can have difficulty getting out of a bathtub. She has burning, tingling in her legs. It can also go into her torso but not her arms. She denies neck pain but did have back pain in the past. The weakness in the legs seems to be getting worse.   She denies freezing. She denies tremor. She denies REM sleep disorder. Her schizophrenia has been stable for a long time.  Sister is worried because patient has a family history of PSP (mother) and MS (maternal cousin and uncle).  Patient takes ropinirole  for possible restless leg syndrome. She denies that she wants to move her legs at night.  Patient lives alone.   She does not report any constitutional symptoms like fever, night sweats, anorexia or unintentional weight loss.  EtOH use: None  Restrictive diet? No   MEDICATIONS:  Outpatient Encounter Medications as of 05/24/2023  Medication Sig   calcium  elemental as carbonate (BARIATRIC TUMS ULTRA) 400 MG chewable tablet Chew 1,000 mg by mouth daily.   cholecalciferol (VITAMIN D3) 25 MCG (1000 UNIT) tablet Take 1,000 Units by mouth daily.   clonazePAM  (KLONOPIN ) 0.5 MG tablet 1 tablet in the AM  and 1 at suppertime   cloZAPine  (CLOZARIL ) 100 MG tablet TAKE 1 TABLET BY MOUTH EVERY MORNING AND 4 TABLETS EVERY EVENING   cyanocobalamin  (VITAMIN B12) 1000 MCG tablet Take 1 tablet (1,000 mcg total) by mouth daily.   erythromycin  ophthalmic ointment Place 1 Application into both eyes at bedtime.   famotidine  (PEPCID ) 10 MG tablet Take 10 mg by mouth daily.    linaclotide  (LINZESS ) 72 MCG capsule Take 1 capsule (72 mcg total) by mouth daily.   polyethylene glycol (MIRALAX  / GLYCOLAX ) 17 g packet Take 17 g by mouth daily.   rOPINIRole  (REQUIP ) 0.25 MG tablet Take 1 tablet (0.25 mg total) by mouth at bedtime.   simvastatin  (ZOCOR ) 40 MG tablet TAKE 1 TABLET(40 MG) BY MOUTH DAILY AT 6 PM   [DISCONTINUED] clonazePAM  (KLONOPIN ) 0.5 MG tablet 1 tablet in the AM and 1 at suppertime   No facility-administered encounter medications on file as of 05/24/2023.    PAST MEDICAL HISTORY: Past Medical History:  Diagnosis Date   Anxiety    BURSITIS, RIGHT KNEE    Depression    Dyskinesia of esophagus    GAIT DISTURBANCE    GERD    HEMORRHOIDS, NOS    HYPERLIPIDEMIA    MENOPAUSAL SYNDROME    Osteoporosis dx 03/2011   DEXA at gyn, started boniva  in additon to Ca+D, changed to Prolia  09/2012   SCHIZOPHRENIA    SYMPTOM, INCONTINENCE, URGE     PAST SURGICAL HISTORY: Past Surgical History:  Procedure Laterality Date  ARTHRODESIS METATARSAL Left 01/29/2021   Procedure: ARTHRODESIS TARSOMETATARSAL LEFT FOOT;  Surgeon: Floyce Hutching, DPM;  Location: [facility];  Service: Podiatry;  Laterality: Left;  GENERAL WITH BLOCK   BREAST SURGERY  05/09/1998   left breast biopsy   CLOSED REDUCTION METATARSAL Left 12/11/2020   Procedure: CLOSED REDUCTION METATARSAL;  Surgeon: Floyce Hutching, DPM;  Location: [facility];  Service: Podiatry;  Laterality: Left;  reduction and pinning (2.4mm Steinmann pins)   COLONOSCOPY  03/16/2010   also 2017    ALLERGIES: No Known Allergies  FAMILY HISTORY: Family History   Problem Relation Age of Onset   Hypertension Mother    Depression Mother    Prostate cancer Father    Colon cancer Brother 77   Esophageal cancer Neg Hx    Pancreatic cancer Neg Hx    Rectal cancer Neg Hx    Stomach cancer Neg Hx     SOCIAL HISTORY: Social History   Tobacco Use   Smoking status: Never   Smokeless tobacco: Never  Vaping Use   Vaping status: Never Used  Substance Use Topics   Alcohol use: No    Alcohol/week: 0.0 standard drinks of alcohol   Drug use: No   Social History   Social History Narrative   Lives alone in single family home. Sister provides support and come to visits. Pt drives and indep ADLs.    Are you right handed or left handed? Right   Are you currently employed ?    What is your current occupation? none   Do you live at home alone? yes   Who lives with you?    What type of home do you live in: 1 story or 2 story? one    Caffeine 2 cups of tea     OBJECTIVE: PHYSICAL EXAM: BP 98/61   Pulse (!) 109   Ht 5\' 1"  (1.549 m)   Wt 127 lb (57.6 kg)   SpO2 98%   BMI 24.00 kg/m   General: General appearance: Awake and alert. No distress. Disheveled, unkept appearance. Cooperative with exam.  Skin: No obvious rash or jaundice. HEENT: Atraumatic Lungs: Non-labored breathing on room air  Extremities: No edema. Psych: Flat affect  Neurological: Mental Status: Alert. Speech fluent. No pseudobulbar affect Cranial Nerves: CNII: No RAPD. Visual fields grossly intact. CNIII, IV, VI: PERRL. No nystagmus. EOMI. CN V: Facial sensation intact bilaterally to fine touch. CN VII: Facial muscles symmetric and strong. No ptosis at rest. CN VIII: Hearing grossly intact bilaterally. CN IX: No hypophonia. CN X: Palate elevates symmetrically. CN XI: Full strength shoulder shrug bilaterally. CN XII: Tongue protrusion full and midline. No atrophy or fasciculations. No significant dysarthria Motor: Tone is normal. Strength is 5/5 in bilateral upper  and lower extremities. Reflexes:  Right Left   Bicep 1+ 1+   Tricep 1+ 1+   BrRad 1+ 1+   Knee 1+ 1+   Ankle 0 0    Pathological Reflexes: Babinski: flexor response bilaterally Hoffman: absent bilaterally Troemner: absent bilaterally Sensation: Pinprick: Intact in all extremities Vibration: Intact in all extremities Proprioception: Equivocal responses, but may be some loss (vs not understanding the test) Coordination: Intact finger-to- nose-finger bilaterally. Romberg negative. Finger tapping and toe tapping difficult due to patient not understanding what she should do, but with good effort, appears normal. Gait: Narrow-based gait. Shuffles her feet close to the ground, not lifting feet. No freezing, no en bloc turns.  Lab and Test Review: Internal labs: 09/19/22: LDL  87 TSH wnl B12: > 1500 Ferritin 39.0 CMP unremarkable  HbA1c (03/31/20): 5.6  External labs: CBC w/ diff (04/19/23) unremarkable  Imaging: Lumbar spine xray (03/23/23): FINDINGS: Five non-rib-bearing lumbar vertebra. Slight straightening of normal lordosis. Trace anterolisthesis of L4 on L5. Normal vertebral body heights. No fracture or compression deformity. Minor anterior spurring at multiple levels, mild L5-S1 disc space narrowing. Mild multilevel facet hypertrophy. No evidence of pars defects or focal bone abnormality.   IMPRESSION: 1. Mild multilevel degenerative disc disease and facet hypertrophy. 2. Trace anterolisthesis of L4 on L5.  ASSESSMENT: Nancy Brooks is a 73 y.o. female who presents for evaluation of falls and leg weakness. She has a relevant medical history of HLD, schizophrenia. Her neurological examination is pertinent for shuffling gait and possible diminished proprioception in her feet, but with normal strength of legs and no definitive evidence of parkinsonism. Available diagnostic data is significant for normal B12 and HbA1c. The etiology of patient's symptoms is unclear. Sister  was worried about family history of MS and PSP. MS seems unlikely to explain symptoms. While parkinsonism is possible, including drug induced parkinsonism given she has been on antipsychotics chronically, I do not see clear evidence of this on exam today. I wonder if there may be some neuropathy or radiculopathy, given history of back pain, that may explain her imbalance. It is difficult to really know given that patient is not able to give a good history. I will get labs to look for treatable causes and offered an EMG as a first step toward trying to find the etiology. She may also have RLS, but this is less clear given that she denies these symptoms currently.  PLAN: -Blood work: B1, B6, IFE, iron studies -Discussed EMG, patient will talk it over with sister -Stop ropinirole   -Start gabapentin  100 mg at bedtime -Physical therapy for gait training  -Return to clinic in 4-5 months  The impression above as well as the plan as outlined below were extensively discussed with the patient (in the company of sister) who voiced understanding. All questions were answered to their satisfaction.  The patient was counseled on pertinent fall precautions per the printed material provided today, and as noted under the "Patient Instructions" section below.  When available, results of the above investigations and possible further recommendations will be communicated to the patient via telephone/MyChart. Patient to call office if not contacted after expected testing turnaround time.   Thank you for allowing me to participate in patient's care.  If I can answer any additional questions, I would be pleased to do so.  Rommie Coats, MD   CC: Adelia Homestead, MD [address] [place] [address]  CC: Referring provider: Adelia Homestead, MD [address] [place],  [place] [address]

## 2023-05-12 NOTE — Telephone Encounter (Signed)
   Chief Complaint: eye discharge Symptoms: bilateral eye discharge, bilateral eye itching, bilateral mild eye pain, bilateral eye redness Frequency: since yesterday Pertinent Negatives: Patient denies fever Disposition: [] ED /[] Urgent Care (no appt availability in office) / [x] Appointment(In office/virtual)/ []  [facility]/ [] Home Care/ [x] Refused Recommended Disposition /[] [facility]/ []  Follow-up with PCP Additional Notes: Patient reports she has been experiencing bilateral eye discharge, itching, redness, and mild pain since yesterday. Patient reports she was around her nephews for the holidays and believes she has conjunctivitis. Per protocol, this RN attempted to schedule office visit. Patient reports that she is out of town and is unable to be seen, but would like her PCP to call her in eye drops. This RN advised that she would forward request to appropriate person for follow-up, but could not promise that patient would be prescribed drops without being seen. Patient verbalized understanding.    Copied from CRM [id number]. Topic: Clinical - Red Word Triage >> May 12, 2023  8:47 AM Mercedes MATSU wrote: Red Word that prompted transfer to Nurse Triage: Patient had pink eye symptoms since last night. States her eyes are swollen, red , itchy, crusty and painful. Is out of town and requesting antibiotics or a telemedicine visit. Reason for Disposition  MODERATE eye pain (e.g., interferes with normal activities)  Answer Assessment - Initial Assessment Questions 1. EYE DISCHARGE: Is the discharge in one or both eyes? What color is it? How much is there? When did the discharge start?      Both eyes, crusty, started yesterday 2. REDNESS OF SCLERA: Is the redness in one or both eyes? When did the redness start?      Redness of both eyes 3. EYELIDS: Are the eyelids red or swollen? If Yes, ask: How much?     A little swollen 4. VISION: Is there any difficulty  seeing clearly?      blurry 5. PAIN: Is there any pain? If Yes, ask: How bad is it? (Scale 1-10; or mild, moderate, severe)    - MILD (1-3): doesn't interfere with normal activities     - MODERATE (4-7): interferes with normal activities or awakens from sleep    - SEVERE (8-10): excruciating pain, unable to do any normal activities       mild 6. CONTACT LENS: Do you wear contacts?     no 7. OTHER SYMPTOMS: Do you have any other symptoms? (e.g., fever, runny nose, cough)     Itching eyes, crusty eyes  Protocols used: Eye - Pus or Discharge-A-AH

## 2023-05-18 ENCOUNTER — Encounter: Payer: Self-pay | Admitting: Psychiatry

## 2023-05-22 ENCOUNTER — Ambulatory Visit: Payer: Medicare Other | Admitting: Internal Medicine

## 2023-05-23 ENCOUNTER — Ambulatory Visit: Payer: Medicare Other | Admitting: Psychiatry

## 2023-05-23 ENCOUNTER — Encounter: Payer: Self-pay | Admitting: Psychiatry

## 2023-05-23 DIAGNOSIS — K5903 Drug induced constipation: Secondary | ICD-10-CM

## 2023-05-23 DIAGNOSIS — G2581 Restless legs syndrome: Secondary | ICD-10-CM | POA: Diagnosis not present

## 2023-05-23 DIAGNOSIS — F200 Paranoid schizophrenia: Secondary | ICD-10-CM

## 2023-05-23 DIAGNOSIS — F411 Generalized anxiety disorder: Secondary | ICD-10-CM

## 2023-05-23 DIAGNOSIS — Z79899 Other long term (current) drug therapy: Secondary | ICD-10-CM

## 2023-05-23 MED ORDER — CLONAZEPAM 0.5 MG PO TABS: ORAL_TABLET | ORAL | 2 refills | Status: DC

## 2023-05-23 NOTE — Progress Notes (Signed)
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 and increased fall risk among others.  Discussed long-term side effect risk including dependence, potential withdrawal symptoms, and the potential eventual dose-related risk of dementia.  But recent studies from 2020 dispute this association between benzodiazepines and dementia risk. Newer studies in 2020 do not support an association with dementia. Her anxiety is quite severe  but not clear what change could help.   Tried switch from Librium  to clonazepam  1 mg BID in hopes of better relief bc it is difficult to use SSRI with her bc DDI.  But it didn't seem any different.  No problems noted since reduced to 0.5 mg BID bc lack of obvious benefit with higher dose.      not recommended but consider Luvox bc obsessive thoughts, but  Concerns about polypharmacy and drug interactions so defer.  Would like a trial of this but concerns she can't tolerate a reduction in clozapine  which would likely be necessary. Consider sertraline for intrusive thoughts.    Disc driving issues.  She is chronically anxious and that affects driving confidence.  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  Disc risk of ropinirole  and psychosis but risk  should be minimal with low dose for RLS.  If doesn't work at low dose consider gabapentin  instead.   It is working fine.    02/23/22      Component Ref Range & Units 2 mo ago  Clozapine  Lvl 350 - 600 ng/mL 1,542 High   NorClozapine Not Estab. ng/mL 871  Total(Cloz+Norcloz) ng/mL 2,413       Signed      After reviewing the blood level and thinking about the situation, I have decided that it is too risky medically to try the fluvoxamine augmentation that I had been considering.  Therefore continue the current medications and I do not want to  make any changes.          Nancy Brooks' # [phone].    Saw Dr. Rollene about change in skin color on your face.  No medical concerns raised.     FU every 8 weeks  Lorene Macintosh, MD, DFAPA   Please see After Visit Summary for patient specific instructions.  Future Appointments  Date Time Provider Department Center  05/24/2023  3:00 PM Leigh Venetia CROME, MD [facility] None       No orders of the defined types were placed in this encounter.      -------------------------------

## 2023-05-24 ENCOUNTER — Ambulatory Visit (INDEPENDENT_AMBULATORY_CARE_PROVIDER_SITE_OTHER): Payer: Medicare Other | Admitting: Neurology

## 2023-05-24 ENCOUNTER — Other Ambulatory Visit: Payer: Medicare Other

## 2023-05-24 ENCOUNTER — Encounter: Payer: Self-pay | Admitting: Neurology

## 2023-05-24 VITALS — BP 98/61 | HR 109 | Ht 61.0 in | Wt 127.0 lb

## 2023-05-24 DIAGNOSIS — R209 Unspecified disturbances of skin sensation: Secondary | ICD-10-CM | POA: Diagnosis not present

## 2023-05-24 DIAGNOSIS — R296 Repeated falls: Secondary | ICD-10-CM

## 2023-05-24 DIAGNOSIS — R269 Unspecified abnormalities of gait and mobility: Secondary | ICD-10-CM

## 2023-05-24 DIAGNOSIS — E611 Iron deficiency: Secondary | ICD-10-CM

## 2023-05-24 DIAGNOSIS — R29898 Other symptoms and signs involving the musculoskeletal system: Secondary | ICD-10-CM

## 2023-05-24 MED ORDER — GABAPENTIN 100 MG PO CAPS: 100.0000 mg | ORAL_CAPSULE | Freq: Every day | ORAL | 5 refills | Status: DC

## 2023-05-24 NOTE — Patient Instructions (Signed)
 I saw you today for falls and issues with your legs. Your leg muscles seem strong. You do not pick up your feet when you walk though, which makes it more likely you will fall. I wonder if you have nerve damage in your legs. I do not see clear evidence of parkinsonism (like PSP) but I will keep an eye on this.  I would like to get blood work today. I will be in touch when I have those results.  We discussed a nerve and muscle test called EMG. I am providing more information below. Let me know if you decide you want to do it.  I want you to stop the ropinirole  and start gabapentin  100 mg at bedtime instead. This could help with restless legs or the burning sensation in your legs. This is a very low dose, so we may need to go up on it in the future.  I am also referring you to physical therapy to help with your walking and try to prevent falls in the future.  I will see you back in clinic in 4-5 months and see how you are doing.  Please let me know if you have any questions or concerns in the meantime.  The physicians and staff at [facility] are committed to providing excellent care. You may receive a survey requesting feedback about your experience at our office. We strive to receive "very good" responses to the survey questions. If you feel that your experience would prevent you from giving the office a "very good " response, please contact our office to try to remedy the situation. We may be reached at [phone]. Thank you for taking the time out of your busy day to complete the survey.  Rommie Coats, MD [facility]  Preventing Falls at [facility] are common, often dreaded events in the lives of older people. Aside from the obvious injuries and even death that may result, fall can cause wide-ranging consequences including loss of independence, mental decline, decreased activity and mobility. Younger people are also at risk of falling, especially those with chronic illnesses and  fatigue.  Ways to reduce risk for falling Examine diet and medications. Warm foods and alcohol dilate blood vessels, which can lead to dizziness when standing. Sleep aids, antidepressants and pain medications can also increase the likelihood of a fall.  Get a vision exam. Poor vision, cataracts and glaucoma increase the chances of falling.  Check foot gear. Shoes should fit snugly and have a sturdy, nonskid sole and a broad, low heel  Participate in a physician-approved exercise program to build and maintain muscle strength and improve balance and coordination. Programs that use ankle weights or stretch bands are excellent for muscle-strengthening. Water aerobics programs and low-impact Tai Chi programs have also been shown to improve balance and coordination.  Increase vitamin D  intake. Vitamin D  improves muscle strength and increases the amount of calcium  the body is able to absorb and deposit in bones.  How to prevent falls from common hazards Floors - Remove all loose wires, cords, and throw rugs. Minimize clutter. Make sure rugs are anchored and smooth. Keep furniture in its usual place.  Chairs -- Use chairs with straight backs, armrests and firm seats. Add firm cushions to existing pieces to add height.  Bathroom - Install grab bars and non-skid tape in the tub or shower. Use a bathtub transfer bench or a shower chair with a back support Use an elevated toilet seat and/or safety rails to assist  standing from a low surface. Do not use towel racks or bathroom tissue holders to help you stand.  Lighting - Make sure halls, stairways, and entrances are well-lit. Install a night light in your bathroom or hallway. Make sure there is a light switch at the top and bottom of the staircase. Turn lights on if you get up in the middle of the night. Make sure lamps or light switches are within reach of the bed if you have to get up during the night.  Kitchen - Install non-skid rubber mats near the  sink and stove. Clean spills immediately. Store frequently used utensils, pots, pans between waist and eye level. This helps prevent reaching and bending. Sit when getting things out of lower cupboards.  Living room/ Bedrooms - Place furniture with wide spaces in between, giving enough room to move around. Establish a route through the living room that gives you something to hold onto as you walk.  Stairs - Make sure treads, rails, and rugs are secure. Install a rail on both sides of the stairs. If stairs are a threat, it might be helpful to arrange most of your activities on the lower level to reduce the number of times you must climb the stairs.  Entrances and doorways - Install metal handles on the walls adjacent to the doorknobs of all doors to make it more secure as you travel through the doorway.  Tips for maintaining balance Keep at least one hand free at all times. Try using a backpack or fanny pack to hold things rather than carrying them in your hands. Never carry objects in both hands when walking as this interferes with keeping your balance.  Attempt to swing both arms from front to back while walking. This might require a conscious effort if Parkinson's disease has diminished your movement. It will, however, help you to maintain balance and posture, and reduce fatigue.  Consciously lift your feet off of the ground when walking. Shuffling and dragging of the feet is a common culprit in losing your balance.  When trying to navigate turns, use a "U" technique of facing forward and making a wide turn, rather than pivoting sharply.  Try to stand with your feet shoulder-length apart. When your feet are close together for any length of time, you increase your risk of losing your balance and falling.  Do one thing at a time. Don't try to walk and accomplish another task, such as reading or looking around. The decrease in your automatic reflexes complicates motor function, so the less  distraction, the better.  Do not wear rubber or gripping soled shoes, they might "catch" on the floor and cause tripping.  Move slowly when changing positions. Use deliberate, concentrated movements and, if needed, use a grab bar or walking aid. Count 15 seconds between each movement. For example, when rising from a seated position, wait 15 seconds after standing to begin walking.  If balance is a continuous problem, you might want to consider a walking aid such as a cane, walking stick, or walker. Once you've mastered walking with help, you might be ready to try it on your own again.   ELECTROMYOGRAM AND NERVE CONDUCTION STUDIES (EMG/NCS) INSTRUCTIONS  How to Prepare The neurologist conducting the EMG will need to know if you have certain medical conditions. Tell the neurologist and other EMG lab personnel if you: Have a pacemaker or any other electrical medical device Take blood-thinning medications Have hemophilia, a blood-clotting disorder that causes prolonged bleeding Bathing  Take a shower or bath shortly before your exam in order to remove oils from your skin. Don't apply lotions or creams before the exam.  What to Expect You'll likely be asked to change into a hospital gown for the procedure and lie down on an examination table. The following explanations can help you understand what will happen during the exam.  Electrodes. The neurologist or a technician places surface electrodes at various locations on your skin depending on where you're experiencing symptoms. Or the neurologist may insert needle electrodes at different sites depending on your symptoms.  Sensations. The electrodes will at times transmit a tiny electrical current that you may feel as a twinge or spasm. The needle electrode may cause discomfort or pain that usually ends shortly after the needle is removed. If you are concerned about discomfort or pain, you may want to talk to the neurologist about taking a short break  during the exam.  Instructions. During the needle EMG, the neurologist will assess whether there is any spontaneous electrical activity when the muscle is at rest - activity that isn't present in healthy muscle tissue - and the degree of activity when you slightly contract the muscle.  He or she will give you instructions on resting and contracting a muscle at appropriate times. Depending on what muscles and nerves the neurologist is examining, he or she may ask you to change positions during the exam.  After your EMG You may experience some temporary, minor bruising where the needle electrode was inserted into your muscle. This bruising should fade within several days. If it persists, contact your primary care doctor.

## 2023-05-28 LAB — VITAMIN B1: Vitamin B1 (Thiamine): <6 nmol/L — ABNORMAL LOW (ref 8–30)

## 2023-05-28 LAB — IMMUNOFIXATION ELECTROPHORESIS
IgG (Immunoglobin G), Serum: 452 mg/dL — ABNORMAL LOW (ref 600–1540)
IgM, Serum: 15 mg/dL — ABNORMAL LOW (ref 50–300)
Immunoglobulin A: 73 mg/dL (ref 70–320)

## 2023-05-28 LAB — IRON,TIBC AND FERRITIN PANEL
%SAT: 14 % — ABNORMAL LOW (ref 16–45)
Ferritin: 37 ng/mL (ref 16–288)
Iron: 43 ug/dL — ABNORMAL LOW (ref 45–160)
TIBC: 314 ug/dL (ref 250–450)

## 2023-05-28 LAB — VITAMIN B6: Vitamin B6: <2 ng/mL — ABNORMAL LOW (ref 2.1–21.7)

## 2023-06-01 ENCOUNTER — Ambulatory Visit: Payer: Medicare Other | Attending: Neurology | Admitting: Physical Therapy

## 2023-06-01 DIAGNOSIS — R2689 Other abnormalities of gait and mobility: Secondary | ICD-10-CM | POA: Insufficient documentation

## 2023-06-01 DIAGNOSIS — M6281 Muscle weakness (generalized): Secondary | ICD-10-CM | POA: Diagnosis present

## 2023-06-01 DIAGNOSIS — R29898 Other symptoms and signs involving the musculoskeletal system: Secondary | ICD-10-CM | POA: Diagnosis not present

## 2023-06-01 DIAGNOSIS — R2681 Unsteadiness on feet: Secondary | ICD-10-CM | POA: Diagnosis present

## 2023-06-01 DIAGNOSIS — R296 Repeated falls: Secondary | ICD-10-CM | POA: Diagnosis not present

## 2023-06-01 DIAGNOSIS — R269 Unspecified abnormalities of gait and mobility: Secondary | ICD-10-CM | POA: Diagnosis not present

## 2023-06-01 NOTE — Therapy (Signed)
OUTPATIENT PHYSICAL THERAPY NEURO EVALUATION   Patient Name: Nancy Brooks MRN: [id number] DOB:11-07-1950, 73 y.o., female Today's Date: 06/02/2023   PCP: Myrlene Broker., MD REFERRING PROVIDER: Antony Madura, MD  END OF SESSION:  PT End of Session - 06/02/23 1712     Visit Number 1    Number of Visits 7    Date for PT Re-Evaluation 07/14/23    Authorization Type Medicare    Authorization Time Period 06-01-23 - 08-07-23    PT Start Time 1402    PT Stop Time 1445    PT Time Calculation (min) 43 min    Activity Tolerance Patient tolerated treatment well    Behavior During Therapy Flat affect             Past Medical History:  Diagnosis Date   Anxiety    BURSITIS, RIGHT KNEE    Depression    Dyskinesia of esophagus    GAIT DISTURBANCE    GERD    HEMORRHOIDS, NOS    HYPERLIPIDEMIA    MENOPAUSAL SYNDROME    Osteoporosis dx 03/2011   DEXA at gyn, started boniva in additon to Ca+D, changed to Prolia 09/2012   SCHIZOPHRENIA    SYMPTOM, INCONTINENCE, URGE    Past Surgical History:  Procedure Laterality Date   ARTHRODESIS METATARSAL Left 01/29/2021   Procedure: ARTHRODESIS TARSOMETATARSAL LEFT FOOT;  Surgeon: Edwin Cap, DPM;  Location: [facility];  Service: Podiatry;  Laterality: Left;  GENERAL WITH BLOCK   BREAST SURGERY  05/09/1998   left breast biopsy   CLOSED REDUCTION METATARSAL Left 12/11/2020   Procedure: CLOSED REDUCTION METATARSAL;  Surgeon: Edwin Cap, DPM;  Location: [facility];  Service: Podiatry;  Laterality: Left;  reduction and pinning (2.32mm Steinmann pins)   COLONOSCOPY  03/16/2010   also 2017   Patient Active Problem List   Diagnosis Date Noted   Weakness of both legs 03/23/2023   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   Restless leg syndrome 09/19/2022   Depression 02/02/2021   Anxiety 02/02/2021   Constipation 12/11/2020   Incontinence of feces 03/12/2019   B12 deficiency 12/01/2017   Routine general medical examination at a health care  facility 05/22/2015   Senile osteoporosis 10/05/2011   Hyperlipidemia 01/20/2009   GERD 01/20/2009   Schizophrenia, unspecified type (HCC) 07/06/2006    ONSET DATE: Referral date 05-24-23  REFERRING DIAG: R29.6 (ICD-10-CM) - Frequent falls R29.898 (ICD-10-CM) - Weakness of both legs R26.9 (ICD-10-CM) - Gait abnormality  THERAPY DIAG:  Other abnormalities of gait and mobility  Muscle weakness (generalized)  Unsteadiness on feet  Rationale for Evaluation and Treatment: Rehabilitation  SUBJECTIVE:  SUBJECTIVE STATEMENT: Per sister, patient does not pick up her feet for about 3 years (right more than left). This fall she had more trouble walking. She has had to be helped walking. She has fallen multiple times - sister says pt is unable to stand for long periods of time; states her knees will slowly bend and she will go down unless she has assistance to keep from falling.  Pt likes to go to thrift stores and sister says she will spend hours in the store but will sit and rest on furniture if available.  Pt currently amb. Without device; sister says pt fell in 2022 and fractured her Lt ankle - has had more difficulty walking since this injury Pt accompanied by: family member - sister   PERTINENT HISTORY: medical history of HLD, schizophrenia, osteoporosis, anxiety and depression; h/o Lt ankle fracture in 2022, dyschromia  Chart note from Dr. Loleta Chance:  "Per sister, patient does not pick up her feet for about 3 years (right more than left). This fall she had more trouble walking. She has had to be helped walking. She has fallen multiple times, maybe a couple of times per month per patient. She can have difficulty getting out of a bathtub. She has burning, tingling in her legs. It can also go into her torso but not her  arms. She denies neck pain but did have back pain in the past. The weakness in the legs seems to be getting worse."  PAIN:  Are you having pain? Yes - pt reports stinging and burning in both feet; reports mild pain in Rt foot at this time   PRECAUTIONS: Fall  RED FLAGS: None   WEIGHT BEARING RESTRICTIONS: No  FALLS: Has patient fallen in last 6 months? Yes. Number of falls 10+  LIVING ENVIRONMENT: Lives with: lives alone Lives in: House/apartment Stairs: Yes: External: 3 steps; 1 hand rail at front Has following equipment at home: Walker - 2 wheeled and Grab bars  PLOF: Independent with basic ADLs, Independent with household mobility without device, and Independent with community mobility without device  PATIENT GOALS: get stronger; improve walking  OBJECTIVE:  Note: Objective measures were completed at Evaluation unless otherwise noted.  DIAGNOSTIC FINDINGS: MPRESSION: Lumbar spine x-ray 03-23-23 1. Mild multilevel degenerative disc disease and facet hypertrophy. 2. Trace anterolisthesis of L4 on L5.    COGNITION: Overall cognitive status: Impaired pt is poor historian - sister provides medical history   SENSATION: WFL  COORDINATION: WFL's bil. LE's   POSTURE: rounded shoulders, forward head, and increased thoracic kyphosis  LOWER EXTREMITY ROM:   WFL's bil. LE's   LOWER EXTREMITY MMT:    MMT Right Eval Left Eval  Hip flexion 3+ 4  Hip extension    Hip abduction    Hip adduction    Hip internal rotation    Hip external rotation    Knee flexion 3+ 4  Knee extension 5 5  Ankle dorsiflexion 4 4  Ankle plantarflexion    Ankle inversion    Ankle eversion    (Blank rows = not tested)  BED MOBILITY:  Independent  TRANSFERS: Assistive device utilized: None  Sit to stand: Modified independence Stand to sit: Modified independence  GAIT: Gait pattern: decreased arm swing- Right, decreased arm swing- Left, decreased step length- Right, decreased step  length- Left, shuffling, and trunk flexed Distance walked: approx. 100' Assistive device utilized: None Level of assistance: Modified independence Comments: pt has decreased initial heel contact bil. LE's in initial stance  phase of gait  FUNCTIONAL TESTS:  5 times sit to stand: 12.12 secs from mat without UE support (performed squat move without complete return to seated position during test on any rep) Timed up and go (TUG): 13.03 secs without device 10 meter walk test: 12.21 secs = 2.69 ft/sec without device                              06/01/23 0001  Berg Balance Test  Sit to Stand 4  Standing Unsupported 4  Sitting with Back Unsupported but Feet Supported on Floor or Stool 4  Stand to Sit 4  Transfers 4  Standing Unsupported with Eyes Closed 3  Standing Unsupported with Feet Together 3  From Standing, Reach Forward with Outstretched Arm 3  From Standing Position, Pick up Object from Floor 4  From Standing Position, Turn to Look Behind Over each Shoulder 4  Turn 360 Degrees 2 (4.87 to Lt:  5.6 to Rt)  Standing Unsupported, Alternately Place Feet on Step/Stool 2  Standing Unsupported, One Foot in Front 2  Standing on One Leg 1  Total Score 44                                                                                                       TREATMENT DATE: 02-29-24 Eval only ; discussed need for safety apparatus for pt since she lives alone - such as LifeAlert; also discussed need for either cell phone (sister states that pt keeps her flip phone in her car for use in case of emergency only); recommend pt get portable handset for her phone since she has a landline for home phone   PATIENT EDUCATION: Education details: eval results and POC; instructed pt and sister in need to work on more upright posture and initial heel contact at stance in ambulation; recommended LifeAlert and portable handset for land line phone; also discussed benefit of rollator with seat for community  ambulation Person educated: Patient and sister Education method: Explanation Education comprehension: verbalized understanding  HOME EXERCISE PROGRAM: To be established   GOALS: Goals reviewed with patient? Yes  SHORT TERM GOALS:   Target date: 07-03-23 (3 weeks)  Independent in HEP for balance and strengthening exercises. Baseline:  Goal status: INITIAL   2.   Pt will verbalize 2 techniques to improve gait:  need to stand erect and increased heel contact for improved ambulation Baseline:  Goal status: INITIAL   LONG TERM GOALS: Target date: 07-14-23 (6 weeks)  Pt will amb. 115' without device with upright posture and with minimal shuffling gait to improve gait and reduce fall risk. Baseline:  Goal status: INITIAL  2.  Pt will transfer floor to stand with UE support on object with SBA. Baseline:  Goal status: INITIAL  3.  Improve Berg balance test score to >/= 49/56 to reduce fall risk. Baseline:  44/56 on 06-01-23 Goal status: INITIAL  4.  Increase gait velocity to >/= 3.1 ft/sec without device to improve gait efficiency. Baseline: 2.69 ft/sec without device  on 06-01-23 Goal status: INITIAL   ASSESSMENT:  CLINICAL IMPRESSION: Patient is a 73 y.o. lady who was seen today for physical therapy evaluation and treatment for LE weakness and gait and balance impairments.  Pt's PMH includes schizophrenia, osteoporosis, anxiety and depression; h/o Lt ankle fracture in 2022,  and dyschromia.  Pt currently is ambulating without use of assistive device but demonstrates gait deviations including shuffling gait and forward flexed posture.  Pt has decreased strength bil. LE's, with RLE slightly weaker than LLE.  Pt is at fall risk per Berg balance test score of 44/56.  Pt will benefit from PT to address LE strengthening, gait and balance impairments.    OBJECTIVE IMPAIRMENTS: Abnormal gait, decreased balance, decreased strength, and postural dysfunction.   ACTIVITY LIMITATIONS: carrying,  lifting, bending, standing, stairs, and locomotion level  PARTICIPATION LIMITATIONS: meal prep, cleaning, laundry, shopping, and community activity  PERSONAL FACTORS: Behavior pattern, Past/current experiences, Time since onset of injury/illness/exacerbation, and 1-2 comorbidities: schizophrenia & osteoporosis  are also affecting patient's functional outcome.   REHAB POTENTIAL: Fair due to cognition   CLINICAL DECISION MAKING: Evolving/moderate complexity  EVALUATION COMPLEXITY: Moderate  PLAN:  PT FREQUENCY: 1x/week  PT DURATION: 6 weeks  PLANNED INTERVENTIONS: 97110-Therapeutic exercises, 97530- Therapeutic activity, [id number]- Neuromuscular re-education, 97535- Self Care, and [id number]- Gait training  PLAN FOR NEXT SESSION: HEP for balance and strengthening; gait training   Garnette Greb, Donavan Burnet, PT 06/02/2023, 5:16 PM

## 2023-06-02 NOTE — Progress Notes (Signed)
   06/01/23 0001  Berg Balance Test  Sit to Stand 4  Standing Unsupported 4  Sitting with Back Unsupported but Feet Supported on Floor or Stool 4  Stand to Sit 4  Transfers 4  Standing Unsupported with Eyes Closed 3  Standing Unsupported with Feet Together 3  From Standing, Reach Forward with Outstretched Arm 3  From Standing Position, Pick up Object from Floor 4  From Standing Position, Turn to Look Behind Over each Shoulder 4  Turn 360 Degrees 2 (4.87 to Lt:  5.6 to Rt)  Standing Unsupported, Alternately Place Feet on Step/Stool 2  Standing Unsupported, One Foot in Front 2  Standing on One Leg 1  Total Score 44

## 2023-06-08 ENCOUNTER — Ambulatory Visit: Payer: Medicare Other | Admitting: Physical Therapy

## 2023-06-08 DIAGNOSIS — R2689 Other abnormalities of gait and mobility: Secondary | ICD-10-CM

## 2023-06-08 DIAGNOSIS — M6281 Muscle weakness (generalized): Secondary | ICD-10-CM

## 2023-06-08 DIAGNOSIS — R2681 Unsteadiness on feet: Secondary | ICD-10-CM

## 2023-06-09 ENCOUNTER — Encounter: Payer: Self-pay | Admitting: Physical Therapy

## 2023-06-09 NOTE — Therapy (Signed)
OUTPATIENT PHYSICAL THERAPY NEURO TREATMENT NOTE   Patient Name: Nancy Brooks MRN: [id number] DOB:08/31/1950, 73 y.o., female Today's Date: 06/09/2023   PCP: Myrlene Broker., MD REFERRING PROVIDER: Antony Madura, MD  END OF SESSION:  PT End of Session - 06/09/23 2005     Visit Number 2    Number of Visits 7    Date for PT Re-Evaluation 07/14/23    Authorization Type Medicare    Authorization Time Period 06-01-23 - 08-07-23    PT Start Time 1405    PT Stop Time 1443    PT Time Calculation (min) 38 min    Activity Tolerance Patient tolerated treatment well    Behavior During Therapy Flat affect             Past Medical History:  Diagnosis Date   Anxiety    BURSITIS, RIGHT KNEE    Depression    Dyskinesia of esophagus    GAIT DISTURBANCE    GERD    HEMORRHOIDS, NOS    HYPERLIPIDEMIA    MENOPAUSAL SYNDROME    Osteoporosis dx 03/2011   DEXA at gyn, started boniva in additon to Ca+D, changed to Prolia 09/2012   SCHIZOPHRENIA    SYMPTOM, INCONTINENCE, URGE    Past Surgical History:  Procedure Laterality Date   ARTHRODESIS METATARSAL Left 01/29/2021   Procedure: ARTHRODESIS TARSOMETATARSAL LEFT FOOT;  Surgeon: Edwin Cap, DPM;  Location: [facility];  Service: Podiatry;  Laterality: Left;  GENERAL WITH BLOCK   BREAST SURGERY  05/09/1998   left breast biopsy   CLOSED REDUCTION METATARSAL Left 12/11/2020   Procedure: CLOSED REDUCTION METATARSAL;  Surgeon: Edwin Cap, DPM;  Location: [facility];  Service: Podiatry;  Laterality: Left;  reduction and pinning (2.41mm Steinmann pins)   COLONOSCOPY  03/16/2010   also 2017   Patient Active Problem List   Diagnosis Date Noted   Weakness of both legs 03/23/2023   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   Restless leg syndrome 09/19/2022   Depression 02/02/2021   Anxiety 02/02/2021   Constipation 12/11/2020   Incontinence of feces 03/12/2019   B12 deficiency 12/01/2017   Routine general medical examination at a health care  facility 05/22/2015   Senile osteoporosis 10/05/2011   Hyperlipidemia 01/20/2009   GERD 01/20/2009   Schizophrenia, unspecified type (HCC) 07/06/2006    ONSET DATE: Referral date 05-24-23  REFERRING DIAG: R29.6 (ICD-10-CM) - Frequent falls R29.898 (ICD-10-CM) - Weakness of both legs R26.9 (ICD-10-CM) - Gait abnormality  THERAPY DIAG:  Other abnormalities of gait and mobility  Muscle weakness (generalized)  Unsteadiness on feet  Rationale for Evaluation and Treatment: Rehabilitation  SUBJECTIVE:  SUBJECTIVE STATEMENT: Pt states she is doing OK - no problems reported  Pt accompanied by: family member - sister   PERTINENT HISTORY: medical history of HLD, schizophrenia, osteoporosis, anxiety and depression; h/o Lt ankle fracture in 2022, dyschromia  Chart note from Dr. Loleta Chance:  "Per sister, patient does not pick up her feet for about 3 years (right more than left). This fall she had more trouble walking. She has had to be helped walking. She has fallen multiple times, maybe a couple of times per month per patient. She can have difficulty getting out of a bathtub. She has burning, tingling in her legs. It can also go into her torso but not her arms. She denies neck pain but did have back pain in the past. The weakness in the legs seems to be getting worse."  PAIN:  Are you having pain? Yes - pt reports stinging and burning in both feet; reports mild pain in Rt foot at this time   PRECAUTIONS: Fall  RED FLAGS: None   WEIGHT BEARING RESTRICTIONS: No  FALLS: Has patient fallen in last 6 months? Yes. Number of falls 10+  LIVING ENVIRONMENT: Lives with: lives alone Lives in: House/apartment Stairs: Yes: External: 3 steps; 1 hand rail at front Has following equipment at home: Walker - 2 wheeled and  Grab bars  PLOF: Independent with basic ADLs, Independent with household mobility without device, and Independent with community mobility without device  PATIENT GOALS: get stronger; improve walking  OBJECTIVE:  Note: Objective measures were completed at Evaluation unless otherwise noted.  DIAGNOSTIC FINDINGS: MPRESSION: Lumbar spine x-ray 03-23-23 1. Mild multilevel degenerative disc disease and facet hypertrophy. 2. Trace anterolisthesis of L4 on L5.    COGNITION: Overall cognitive status: Impaired pt is poor historian - sister provides medical history   SENSATION: WFL  COORDINATION: WFL's bil. LE's   POSTURE: rounded shoulders, forward head, and increased thoracic kyphosis  LOWER EXTREMITY ROM:   WFL's bil. LE's   LOWER EXTREMITY MMT:    MMT Right Eval Left Eval  Hip flexion 3+ 4  Hip extension    Hip abduction    Hip adduction    Hip internal rotation    Hip external rotation    Knee flexion 3+ 4  Knee extension 5 5  Ankle dorsiflexion 4 4  Ankle plantarflexion    Ankle inversion    Ankle eversion    (Blank rows = not tested)  BED MOBILITY:  Independent  TRANSFERS: Assistive device utilized: None  Sit to stand: Modified independence Stand to sit: Modified independence  GAIT: Gait pattern: decreased arm swing- Right, decreased arm swing- Left, decreased step length- Right, decreased step length- Left, shuffling, and trunk flexed Distance walked: approx. 100' Assistive device utilized: None Level of assistance: Modified independence Comments: pt has decreased initial heel contact bil. LE's in initial stance phase of gait  FUNCTIONAL TESTS:  5 times sit to stand: 12.12 secs from mat without UE support (performed squat move without complete return to seated position during test on any rep) Timed up and go (TUG): 13.03 secs without device 10 meter walk test: 12.21 secs = 2.69 ft/sec without device                              06/01/23 0001  Berg  Balance Test  Sit to Stand 4  Standing Unsupported 4  Sitting with Back Unsupported but Feet Supported on Floor or Stool 4  Stand to Sit 4  Transfers 4  Standing Unsupported with Eyes Closed 3  Standing Unsupported with Feet Together 3  From Standing, Reach Forward with Outstretched Arm 3  From Standing Position, Pick up Object from Floor 4  From Standing Position, Turn to Look Behind Over each Shoulder 4  Turn 360 Degrees 2 (4.87 to Lt:  5.6 to Rt)  Standing Unsupported, Alternately Place Feet on Step/Stool 2  Standing Unsupported, One Foot in Front 2  Standing on One Leg 1  Total Score 44                                                                                                       TREATMENT DATE: 03-07-24  TherEx: Pt performed sit to stand 5 reps from mat table with gradual decrease in UE support;  final 2 reps performed without UE support Bil. Heel raises 10 reps with bil. UE support on counter  NeuroRe-ed:  instructed in standing balance exercises for HEP - instructed pt and sister to perform exercises at counter for UE support for safety   Medbridge Access Code: [email or web address] URL: [email or web address] Date: 06/11/2023 Prepared by: Maebelle Munroe  Exercises - Sit to Stand  - 1 x daily - 7 x weekly - 1 sets - 10 reps - Standing Marching  - 1 x daily - 7 x weekly - 1 sets - 10 reps - Standing Hip Abduction  - 1 x daily - 7 x weekly - 1 sets - 10 reps - Standing Hip Flexion with Counter Support  - 1 x daily - 7 x weekly - 1 sets - 10 reps - Standing Hip Extension with Counter Support  - 1 x daily - 7 x weekly - 1 sets - 10 reps - Single Leg Stance with Support  - 1 x daily - 7 x weekly - 1 sets - 1-2 reps - 10 sec hold - Heel Raises with Counter Support  - 1 x daily - 7 x weekly - 1 sets - 10 reps - Single Leg Heel Raise with Chair Support  - 1 x daily - 7 x weekly - 1 sets - 10 reps  Gait: Pt gait trained 115' with CGA without device with verbal cues to  place heels down first in stance rather than forefoot  PATIENT EDUCATION: Education details: eval results and POC; instructed pt and sister in need to work on more upright posture and initial heel contact at stance in ambulation; recommended LifeAlert and portable handset for land line phone; also discussed benefit of rollator with seat for community ambulation Person educated: Patient and sister Education method: Explanation Education comprehension: verbalized understanding  HOME EXERCISE PROGRAM: To be established   GOALS: Goals reviewed with patient? Yes  SHORT TERM GOALS:   Target date: 07-03-23 (3 weeks)  Independent in HEP for balance and strengthening exercises. Baseline:  Goal status: INITIAL   2.   Pt will verbalize 2 techniques to improve gait:  need to stand erect and increased heel contact for improved ambulation Baseline:  Goal status: INITIAL   LONG  TERM GOALS: Target date: 07-14-23 (6 weeks)  Pt will amb. 115' without device with upright posture and with minimal shuffling gait to improve gait and reduce fall risk. Baseline:  Goal status: INITIAL  2.  Pt will transfer floor to stand with UE support on object with SBA. Baseline:  Goal status: INITIAL  3.  Improve Berg balance test score to >/= 49/56 to reduce fall risk. Baseline:  44/56 on 06-01-23 Goal status: INITIAL  4.  Increase gait velocity to >/= 3.1 ft/sec without device to improve gait efficiency. Baseline: 2.69 ft/sec without device on 06-01-23 Goal status: INITIAL   ASSESSMENT:  CLINICAL IMPRESSION: PT session focused on establishing HEP for functional strengthening exercises including sit to stand and heel raises and standing balance exercises with instruction to hold onto counter for safety and assist with balance.  Remainder of session focused on gait training without device with cues to place heels down first in stance phase of gait to reduce shuffling and to minimize gait deviations.  Pt reported  fatigue during session and required frequent short seated rest breaks.  Cont with POC.   OBJECTIVE IMPAIRMENTS: Abnormal gait, decreased balance, decreased strength, and postural dysfunction.   ACTIVITY LIMITATIONS: carrying, lifting, bending, standing, stairs, and locomotion level  PARTICIPATION LIMITATIONS: meal prep, cleaning, laundry, shopping, and community activity  PERSONAL FACTORS: Behavior pattern, Past/current experiences, Time since onset of injury/illness/exacerbation, and 1-2 comorbidities: schizophrenia & osteoporosis  are also affecting patient's functional outcome.   REHAB POTENTIAL: Fair due to cognition   CLINICAL DECISION MAKING: Evolving/moderate complexity  EVALUATION COMPLEXITY: Moderate  PLAN:  PT FREQUENCY: 1x/week  PT DURATION: 6 weeks  PLANNED INTERVENTIONS: 97110-Therapeutic exercises, 97530- Therapeutic activity, [id number]- Neuromuscular re-education, [phone]- Self Care, and 40102- Gait training  PLAN FOR NEXT SESSION: check HEP for questions; gait training   Jalisa Sacco, Donavan Burnet, PT 06/09/2023, 8:08 PM

## 2023-06-16 ENCOUNTER — Other Ambulatory Visit: Payer: Self-pay | Admitting: Psychiatry

## 2023-06-16 DIAGNOSIS — F200 Paranoid schizophrenia: Secondary | ICD-10-CM

## 2023-06-16 NOTE — Telephone Encounter (Signed)
 ANC 1/8 5.8

## 2023-06-20 ENCOUNTER — Encounter: Payer: Self-pay | Admitting: Podiatry

## 2023-06-20 ENCOUNTER — Ambulatory Visit (INDEPENDENT_AMBULATORY_CARE_PROVIDER_SITE_OTHER): Payer: Medicare Other | Admitting: Podiatry

## 2023-06-20 DIAGNOSIS — L84 Corns and callosities: Secondary | ICD-10-CM | POA: Diagnosis not present

## 2023-06-20 DIAGNOSIS — M7742 Metatarsalgia, left foot: Secondary | ICD-10-CM | POA: Diagnosis not present

## 2023-06-20 NOTE — Patient Instructions (Signed)
Look for urea 40% cream or ointment and apply to the thickened dry skin / calluses. This can be bought over the counter, at a pharmacy or online such as [employer].

## 2023-06-22 ENCOUNTER — Ambulatory Visit: Payer: Medicare Other | Attending: Neurology | Admitting: Physical Therapy

## 2023-06-22 DIAGNOSIS — R2681 Unsteadiness on feet: Secondary | ICD-10-CM | POA: Diagnosis present

## 2023-06-22 DIAGNOSIS — M6281 Muscle weakness (generalized): Secondary | ICD-10-CM | POA: Diagnosis present

## 2023-06-22 DIAGNOSIS — R2689 Other abnormalities of gait and mobility: Secondary | ICD-10-CM | POA: Diagnosis present

## 2023-06-22 NOTE — Therapy (Unsigned)
OUTPATIENT PHYSICAL THERAPY NEURO TREATMENT NOTE   Patient Name: Nancy Brooks MRN: [id number] DOB:02-Mar-1951, 73 y.o., female Today's Date: 06/23/2023   PCP: Myrlene Broker., MD REFERRING PROVIDER: Antony Madura, MD  END OF SESSION:  PT End of Session - 06/23/23 2001     Visit Number 3    Number of Visits 7    Date for PT Re-Evaluation 07/14/23    Authorization Type Medicare    Authorization Time Period 06-01-23 - 08-07-23    PT Start Time 1401    PT Stop Time 1445    PT Time Calculation (min) 44 min    Equipment Utilized During Treatment Gait belt    Activity Tolerance Patient tolerated treatment well    Behavior During Therapy Flat affect              Past Medical History:  Diagnosis Date   Anxiety    BURSITIS, RIGHT KNEE    Depression    Dyskinesia of esophagus    GAIT DISTURBANCE    GERD    HEMORRHOIDS, NOS    HYPERLIPIDEMIA    MENOPAUSAL SYNDROME    Osteoporosis dx 03/2011   DEXA at gyn, started boniva in additon to Ca+D, changed to Prolia 09/2012   SCHIZOPHRENIA    SYMPTOM, INCONTINENCE, URGE    Past Surgical History:  Procedure Laterality Date   ARTHRODESIS METATARSAL Left 01/29/2021   Procedure: ARTHRODESIS TARSOMETATARSAL LEFT FOOT;  Surgeon: Edwin Cap, DPM;  Location: [facility];  Service: Podiatry;  Laterality: Left;  GENERAL WITH BLOCK   BREAST SURGERY  05/09/1998   left breast biopsy   CLOSED REDUCTION METATARSAL Left 12/11/2020   Procedure: CLOSED REDUCTION METATARSAL;  Surgeon: Edwin Cap, DPM;  Location: [facility];  Service: Podiatry;  Laterality: Left;  reduction and pinning (2.24mm Steinmann pins)   COLONOSCOPY  03/16/2010   also 2017   Patient Active Problem List   Diagnosis Date Noted   Weakness of both legs 03/23/2023   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   Restless leg syndrome 09/19/2022   Depression 02/02/2021   Anxiety 02/02/2021   Constipation 12/11/2020   Incontinence of feces 03/12/2019   B12 deficiency 12/01/2017    Routine general medical examination at a health care facility 05/22/2015   Senile osteoporosis 10/05/2011   Hyperlipidemia 01/20/2009   GERD 01/20/2009   Schizophrenia, unspecified type (HCC) 07/06/2006    ONSET DATE: Referral date 05-24-23  REFERRING DIAG: R29.6 (ICD-10-CM) - Frequent falls R29.898 (ICD-10-CM) - Weakness of both legs R26.9 (ICD-10-CM) - Gait abnormality  THERAPY DIAG:  Other abnormalities of gait and mobility  Unsteadiness on feet  Rationale for Evaluation and Treatment: Rehabilitation  SUBJECTIVE:  SUBJECTIVE STATEMENT: Pt reports she did the exercises some - sister reports she watched her do them the first few times Pt accompanied by: family member - sister   PERTINENT HISTORY: medical history of HLD, schizophrenia, osteoporosis, anxiety and depression; h/o Lt ankle fracture in 2022, dyschromia  Chart note from Dr. Loleta Chance:  "Per sister, patient does not pick up her feet for about 3 years (right more than left). This fall she had more trouble walking. She has had to be helped walking. She has fallen multiple times, maybe a couple of times per month per patient. She can have difficulty getting out of a bathtub. She has burning, tingling in her legs. It can also go into her torso but not her arms. She denies neck pain but did have back pain in the past. The weakness in the legs seems to be getting worse."  PAIN:  Are you having pain? Yes - pt reports stinging and burning in both feet; reports mild pain in Rt foot at this time   PRECAUTIONS: Fall  RED FLAGS: None   WEIGHT BEARING RESTRICTIONS: No  FALLS: Has patient fallen in last 6 months? Yes. Number of falls 10+  LIVING ENVIRONMENT: Lives with: lives alone Lives in: House/apartment Stairs: Yes: External: 3 steps; 1 hand rail  at front Has following equipment at home: Walker - 2 wheeled and Grab bars  PLOF: Independent with basic ADLs, Independent with household mobility without device, and Independent with community mobility without device  PATIENT GOALS: get stronger; improve walking  OBJECTIVE:  Note: Objective measures were completed at Evaluation unless otherwise noted.  DIAGNOSTIC FINDINGS: MPRESSION: Lumbar spine x-ray 03-23-23 1. Mild multilevel degenerative disc disease and facet hypertrophy. 2. Trace anterolisthesis of L4 on L5.    COGNITION: Overall cognitive status: Impaired pt is poor historian - sister provides medical history   SENSATION: WFL  COORDINATION: WFL's bil. LE's   POSTURE: rounded shoulders, forward head, and increased thoracic kyphosis  LOWER EXTREMITY ROM:   WFL's bil. LE's   LOWER EXTREMITY MMT:    MMT Right Eval Left Eval  Hip flexion 3+ 4  Hip extension    Hip abduction    Hip adduction    Hip internal rotation    Hip external rotation    Knee flexion 3+ 4  Knee extension 5 5  Ankle dorsiflexion 4 4  Ankle plantarflexion    Ankle inversion    Ankle eversion    (Blank rows = not tested)  BED MOBILITY:  Independent  TRANSFERS: Assistive device utilized: None  Sit to stand: Modified independence Stand to sit: Modified independence  GAIT: Gait pattern: decreased arm swing- Right, decreased arm swing- Left, decreased step length- Right, decreased step length- Left, shuffling, and trunk flexed Distance walked: approx. 100' Assistive device utilized: None Level of assistance: Modified independence Comments: pt has decreased initial heel contact bil. LE's in initial stance phase of gait  FUNCTIONAL TESTS:  5 times sit to stand: 12.12 secs from mat without UE support (performed squat move without complete return to seated position during test on any rep) Timed up and go (TUG): 13.03 secs without device 10 meter walk test: 12.21 secs = 2.69 ft/sec  without device                              06/01/23 0001  Berg Balance Test  Sit to Stand 4  Standing Unsupported 4  Sitting with Back Unsupported  but Feet Supported on Floor or Stool 4  Stand to Sit 4  Transfers 4  Standing Unsupported with Eyes Closed 3  Standing Unsupported with Feet Together 3  From Standing, Reach Forward with Outstretched Arm 3  From Standing Position, Pick up Object from Floor 4  From Standing Position, Turn to Look Behind Over each Shoulder 4  Turn 360 Degrees 2 (4.87 to Lt:  5.6 to Rt)  Standing Unsupported, Alternately Place Feet on Step/Stool 2  Standing Unsupported, One Foot in Front 2  Standing on One Leg 1  Total Score 44                                                                                                       TREATMENT DATE: 06-22-23  TherEx: Pt performed sit to stand 5 reps from mat table without UE support with SBA  TherAct: Floor to stand transfer - pt transferred from seated position to standing to tall kneeling to sitting on red mat on floor and then to standing with bil. UE support on mat - performed 2 reps- SBA - no physical assistance needed  Pt carried plastic mild crate with 3# weight inside - 115' to simulate carrying groceries into house   NeuroRe-ed:   SLS activities - touching 3 balance bubbles with each foot 5 reps to improve SLS on each leg Pt performed cone taps to 3 cones - with RUE support on counter for assist with balance Backwards amb. 25' x 2 reps with CGA Marching in place on floor 10 reps each leg with CGA  Gait: Pt gait trained 230' (2 laps) without device with verbal cues to keep feet separated and to place heels down first to reduce foot flat in stance Step negotiation - 4 steps - bil. Rails used - SBA - step over step sequence with ascension, step by step with descension     Medbridge Access Code: [email or web address] URL: [email or web address] Date: 06/11/2023 Prepared by: Maebelle Munroe  Exercises - Sit to Stand  - 1 x daily - 7 x weekly - 1 sets - 10 reps - Standing Marching  - 1 x daily - 7 x weekly - 1 sets - 10 reps - Standing Hip Abduction  - 1 x daily - 7 x weekly - 1 sets - 10 reps - Standing Hip Flexion with Counter Support  - 1 x daily - 7 x weekly - 1 sets - 10 reps - Standing Hip Extension with Counter Support  - 1 x daily - 7 x weekly - 1 sets - 10 reps - Single Leg Stance with Support  - 1 x daily - 7 x weekly - 1 sets - 1-2 reps - 10 sec hold - Heel Raises with Counter Support  - 1 x daily - 7 x weekly - 1 sets - 10 reps - Single Leg Heel Raise with Chair Support  - 1 x daily - 7 x weekly - 1 sets - 10 reps  Gait: Pt gait trained 115' with CGA without device with verbal cues to place heels down  first in stance rather than forefoot  PATIENT EDUCATION: Education details: eval results and POC; instructed pt and sister in need to work on more upright posture and initial heel contact at stance in ambulation; recommended LifeAlert and portable handset for land line phone; also discussed benefit of rollator with seat for community ambulation Person educated: Patient and sister Education method: Explanation Education comprehension: verbalized understanding  HOME EXERCISE PROGRAM: To be established   GOALS: Goals reviewed with patient? Yes  SHORT TERM GOALS:   Target date: 07-03-23 (3 weeks)  Independent in HEP for balance and strengthening exercises. Baseline:  Goal status: INITIAL   2.   Pt will verbalize 2 techniques to improve gait:  need to stand erect and increased heel contact for improved ambulation Baseline:  Goal status: INITIAL   LONG TERM GOALS: Target date: 07-14-23 (6 weeks)  Pt will amb. 115' without device with upright posture and with minimal shuffling gait to improve gait and reduce fall risk. Baseline:  Goal status: INITIAL  2.  Pt will transfer floor to stand with UE support on object with SBA. Baseline:  Goal status:  INITIAL  3.  Improve Berg balance test score to >/= 49/56 to reduce fall risk. Baseline:  44/56 on 06-01-23 Goal status: INITIAL  4.  Increase gait velocity to >/= 3.1 ft/sec without device to improve gait efficiency. Baseline: 2.69 ft/sec without device on 06-01-23 Goal status: INITIAL   ASSESSMENT:  CLINICAL IMPRESSION: PT session focused on balance exercises to improve SLS on each leg, gait training without device with verbal cues to minimize deviations, and floor to stand transfers to assess ability to transfer off floor in case of fall.  Pt continues to ambulate with narrow BOS and with foot contact in stance rather than heel contact in initial stance. Pt tolerated exercises well with minimal rest breaks needed during session.  Cont with POC.   OBJECTIVE IMPAIRMENTS: Abnormal gait, decreased balance, decreased strength, and postural dysfunction.   ACTIVITY LIMITATIONS: carrying, lifting, bending, standing, stairs, and locomotion level  PARTICIPATION LIMITATIONS: meal prep, cleaning, laundry, shopping, and community activity  PERSONAL FACTORS: Behavior pattern, Past/current experiences, Time since onset of injury/illness/exacerbation, and 1-2 comorbidities: schizophrenia & osteoporosis  are also affecting patient's functional outcome.   REHAB POTENTIAL: Fair due to cognition   CLINICAL DECISION MAKING: Evolving/moderate complexity  EVALUATION COMPLEXITY: Moderate  PLAN:  PT FREQUENCY: 1x/week  PT DURATION: 6 weeks  PLANNED INTERVENTIONS: 97110-Therapeutic exercises, 97530- Therapeutic activity, [id number]- Neuromuscular re-education, 97535- Self Care, and 91478- Gait training  PLAN FOR NEXT SESSION: gait and balance training   Angelik Walls, Donavan Burnet, PT 06/23/2023, 8:04 PM

## 2023-06-22 NOTE — Progress Notes (Signed)
  Subjective:  Patient ID: Nancy Brooks, female    DOB: 28-Nov-1950,  MRN: [id number]  Chief Complaint  Patient presents with   Callouses    Left foot callous on bottom of foot causing some discomfort with walking.    73 y.o. female presents with the above complaint. History confirmed with patient.   Objective:  Physical Exam: warm, good capillary refill, no trophic changes or ulcerative lesions, normal DP and PT pulses, normal sensory exam, and submetatarsal 5 painful callus.   Assessment:   1. Callus of foot   2. Metatarsalgia of left foot      Plan:  Patient was evaluated and treated and all questions answered.   All symptomatic hyperkeratoses were safely debrided with a sterile #15 blade to patient's level of comfort without incident. We discussed preventative and palliative care of these lesions including supportive and accommodative shoegear, padding, prefabricated and custom molded accommodative orthoses, use of a pumice stone and lotions/creams daily.  Discussed with her sister custom molded foot orthosis with a metatarsal pad and 1/5 metatarsal unload may offer some benefit as well.  She will be scheduled for fitting for these.   Return if symptoms worsen or fail to improve.

## 2023-06-23 ENCOUNTER — Encounter: Payer: Self-pay | Admitting: Physical Therapy

## 2023-06-29 ENCOUNTER — Ambulatory Visit: Payer: Medicare Other | Admitting: Physical Therapy

## 2023-06-29 ENCOUNTER — Encounter: Payer: Self-pay | Admitting: Psychiatry

## 2023-06-29 NOTE — Therapy (Incomplete)
OUTPATIENT PHYSICAL THERAPY NEURO TREATMENT NOTE   Patient Name: Nancy Brooks MRN: [id number] DOB:03-24-1951, 73 y.o., female Today's Date: 06/29/2023   PCP: Myrlene Broker., MD REFERRING PROVIDER: Antony Madura, MD  END OF SESSION:     Past Medical History:  Diagnosis Date   Anxiety    BURSITIS, RIGHT KNEE    Depression    Dyskinesia of esophagus    GAIT DISTURBANCE    GERD    HEMORRHOIDS, NOS    HYPERLIPIDEMIA    MENOPAUSAL SYNDROME    Osteoporosis dx 03/2011   DEXA at gyn, started boniva in additon to Ca+D, changed to Prolia 09/2012   SCHIZOPHRENIA    SYMPTOM, INCONTINENCE, URGE    Past Surgical History:  Procedure Laterality Date   ARTHRODESIS METATARSAL Left 01/29/2021   Procedure: ARTHRODESIS TARSOMETATARSAL LEFT FOOT;  Surgeon: Edwin Cap, DPM;  Location: [facility];  Service: Podiatry;  Laterality: Left;  GENERAL WITH BLOCK   BREAST SURGERY  05/09/1998   left breast biopsy   CLOSED REDUCTION METATARSAL Left 12/11/2020   Procedure: CLOSED REDUCTION METATARSAL;  Surgeon: Edwin Cap, DPM;  Location: [facility];  Service: Podiatry;  Laterality: Left;  reduction and pinning (2.51mm Steinmann pins)   COLONOSCOPY  03/16/2010   also 2017   Patient Active Problem List   Diagnosis Date Noted   Weakness of both legs 03/23/2023   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  Rationale for  Evaluation and Treatment: Rehabilitation  SUBJECTIVE:                                                                                                                                                                                             SUBJECTIVE STATEMENT: ***  Pt accompanied by: family member - sister   PERTINENT HISTORY: medical history of HLD, schizophrenia, osteoporosis, anxiety and depression; h/o Lt ankle fracture in 2022, dyschromia  Chart note from Dr. Loleta Chance:  "Per sister, patient does not pick up her feet for about 3 years (right more than left). This fall she had more trouble walking. She has  had to be helped walking. She has fallen multiple times, maybe a couple of times per month per patient. She can have difficulty getting out of a bathtub. She has burning, tingling in her legs. It can also go into her torso but not her arms. She denies neck pain but did have back pain in the past. The weakness in the legs seems to be getting worse."  PAIN:  Are you having pain? Yes - pt reports stinging and burning in both feet; reports mild pain in Rt foot at this time   PRECAUTIONS: Fall  RED FLAGS: None   WEIGHT BEARING RESTRICTIONS: No  FALLS: Has patient fallen in last 6 months? Yes. Number of falls 10+  LIVING ENVIRONMENT: Lives with: lives alone Lives in: House/apartment Stairs: Yes: External: 3 steps; 1 hand rail at front Has following equipment at home: Walker - 2 wheeled and Grab bars  PLOF: Independent with basic ADLs, Independent with household mobility without device, and Independent with community mobility without device  PATIENT GOALS: get stronger; improve walking  OBJECTIVE:  Note: Objective measures were completed at Evaluation unless otherwise noted.  DIAGNOSTIC FINDINGS: MPRESSION: Lumbar spine x-ray 03-23-23 1. Mild multilevel degenerative disc disease and facet hypertrophy. 2. Trace anterolisthesis of L4 on L5.    COGNITION: Overall cognitive  status: Impaired pt is poor historian - sister provides medical history   SENSATION: WFL  COORDINATION: WFL's bil. LE's   POSTURE: rounded shoulders, forward head, and increased thoracic kyphosis  LOWER EXTREMITY ROM:   WFL's bil. LE's   LOWER EXTREMITY MMT:    MMT Right Eval Left Eval  Hip flexion 3+ 4  Hip extension    Hip abduction    Hip adduction    Hip internal rotation    Hip external rotation    Knee flexion 3+ 4  Knee extension 5 5  Ankle dorsiflexion 4 4  Ankle plantarflexion    Ankle inversion    Ankle eversion    (Blank rows = not tested)  BED MOBILITY:  Independent  TRANSFERS: Assistive device utilized: None  Sit to stand: Modified independence Stand to sit: Modified independence  GAIT: Gait pattern: decreased arm swing- Right, decreased arm swing- Left, decreased step length- Right, decreased step length- Left, shuffling, and trunk flexed Distance walked: approx. 100' Assistive device utilized: None Level of assistance: Modified independence Comments: pt has decreased initial heel contact bil. LE's in initial stance phase of gait  FUNCTIONAL TESTS:  5 times sit to stand: 12.12 secs from mat without UE support (performed squat move without complete return to seated position during test on any rep) Timed up and go (TUG): 13.03 secs without device 10 meter walk test: 12.21 secs = 2.69 ft/sec without device                              06/01/23 0001  Berg Balance Test  Sit to Stand 4  Standing Unsupported 4  Sitting with Back Unsupported but Feet Supported on Floor or Stool 4  Stand to Sit 4  Transfers 4  Standing Unsupported with Eyes Closed 3  Standing Unsupported with Feet Together 3  From Standing, Reach Forward with Outstretched Arm 3  From Standing Position, Pick up Object from Floor 4  From Standing Position, Turn to Look Behind Over each Shoulder 4  Turn 360 Degrees 2 (4.87 to Lt:  5.6 to Rt)  Standing Unsupported, Alternately  Place Feet on Step/Stool  2  Standing Unsupported, One Foot in Front 2  Standing on One Leg 1  Total Score 44                                                                                                       TREATMENT DATE:   TherAct ***  TherEx ***  Gait Gait pattern: {gait characteristics:25376} Distance walked: *** Assistive device utilized: {Assistive devices:23999} Level of assistance: {Levels of assistance:24026} Comments: ***     Medbridge Access Code: [email or web address] URL: [email or web address] Date: 06/11/2023 Prepared by: Maebelle Munroe  Exercises - Sit to Stand  - 1 x daily - 7 x weekly - 1 sets - 10 reps - Standing Marching  - 1 x daily - 7 x weekly - 1 sets - 10 reps - Standing Hip Abduction  - 1 x daily - 7 x weekly - 1 sets - 10 reps - Standing Hip Flexion with Counter Support  - 1 x daily - 7 x weekly - 1 sets - 10 reps - Standing Hip Extension with Counter Support  - 1 x daily - 7 x weekly - 1 sets - 10 reps - Single Leg Stance with Support  - 1 x daily - 7 x weekly - 1 sets - 1-2 reps - 10 sec hold - Heel Raises with Counter Support  - 1 x daily - 7 x weekly - 1 sets - 10 reps - Single Leg Heel Raise with Chair Support  - 1 x daily - 7 x weekly - 1 sets - 10 reps   PATIENT EDUCATION: Education details: eval results and POC; instructed pt and sister in need to work on more upright posture and initial heel contact at stance in ambulation; recommended LifeAlert and portable handset for land line phone; also discussed benefit of rollator with seat for community ambulation*** Person educated: Patient and sister Education method: Explanation Education comprehension: verbalized understanding  HOME EXERCISE PROGRAM: To be established   GOALS: Goals reviewed with patient? Yes  SHORT TERM GOALS:   Target date: 07-03-23 (3 weeks)  Independent in HEP for balance and strengthening exercises. Baseline:  Goal status: INITIAL   2.   Pt will verbalize  2 techniques to improve gait:  need to stand erect and increased heel contact for improved ambulation Baseline:  Goal status: INITIAL   LONG TERM GOALS: Target date: 07-14-23 (6 weeks)  Pt will amb. 115' without device with upright posture and with minimal shuffling gait to improve gait and reduce fall risk. Baseline:  Goal status: INITIAL  2.  Pt will transfer floor to stand with UE support on object with SBA. Baseline:  Goal status: INITIAL  3.  Improve Berg balance test score to >/= 49/56 to reduce fall risk. Baseline:  44/56 on 06-01-23 Goal status: INITIAL  4.  Increase gait velocity to >/= 3.1 ft/sec without device to improve gait efficiency. Baseline: 2.69 ft/sec without device on 06-01-23 Goal status: INITIAL   ASSESSMENT:  CLINICAL IMPRESSION: Emphasis of skilled PT session*** Continue POC.   OBJECTIVE IMPAIRMENTS: Abnormal gait, decreased balance, decreased  strength, and postural dysfunction.   ACTIVITY LIMITATIONS: carrying, lifting, bending, standing, stairs, and locomotion level  PARTICIPATION LIMITATIONS: meal prep, cleaning, laundry, shopping, and community activity  PERSONAL FACTORS: Behavior pattern, Past/current experiences, Time since onset of injury/illness/exacerbation, and 1-2 comorbidities: schizophrenia & osteoporosis  are also affecting patient's functional outcome.   REHAB POTENTIAL: Fair due to cognition   CLINICAL DECISION MAKING: Evolving/moderate complexity  EVALUATION COMPLEXITY: Moderate  PLAN:  PT FREQUENCY: 1x/week  PT DURATION: 6 weeks  PLANNED INTERVENTIONS: 97110-Therapeutic exercises, 97530- Therapeutic activity, [id number]- Neuromuscular re-education, 97535- Self Care, and 16109- Gait training  PLAN FOR NEXT SESSION: gait and balance training***   Peter Congo, PT Peter Congo, PT, DPT, CSRS  06/29/2023, 7:47 AM

## 2023-07-04 ENCOUNTER — Other Ambulatory Visit: Payer: Self-pay | Admitting: Physician Assistant

## 2023-07-06 ENCOUNTER — Encounter: Payer: Self-pay | Admitting: Physical Therapy

## 2023-07-06 ENCOUNTER — Ambulatory Visit: Payer: Medicare Other | Admitting: Physical Therapy

## 2023-07-06 DIAGNOSIS — R2681 Unsteadiness on feet: Secondary | ICD-10-CM

## 2023-07-06 DIAGNOSIS — M6281 Muscle weakness (generalized): Secondary | ICD-10-CM

## 2023-07-06 DIAGNOSIS — R2689 Other abnormalities of gait and mobility: Secondary | ICD-10-CM | POA: Diagnosis not present

## 2023-07-06 NOTE — Therapy (Signed)
 OUTPATIENT PHYSICAL THERAPY NEURO TREATMENT NOTE   Patient Name: Nancy Brooks MRN: [id number] DOB:1951-04-30, 73 y.o., female Today's Date: 07/06/2023   PCP: Myrlene Broker., MD REFERRING PROVIDER: Antony Madura, MD  END OF SESSION:  PT End of Session - 07/06/23 2011     Visit Number 4    Number of Visits 7    Date for PT Re-Evaluation 07/14/23    Authorization Type Medicare    Authorization Time Period 06-01-23 - 08-07-23    PT Start Time 1405    PT Stop Time 1450    PT Time Calculation (min) 45 min    Equipment Utilized During Treatment Gait belt    Activity Tolerance Patient tolerated treatment well    Behavior During Therapy Flat affect               Past Medical History:  Diagnosis Date   Anxiety    BURSITIS, RIGHT KNEE    Depression    Dyskinesia of esophagus    GAIT DISTURBANCE    GERD    HEMORRHOIDS, NOS    HYPERLIPIDEMIA    MENOPAUSAL SYNDROME    Osteoporosis dx 03/2011   DEXA at gyn, started boniva in additon to Ca+D, changed to Prolia 09/2012   SCHIZOPHRENIA    SYMPTOM, INCONTINENCE, URGE    Past Surgical History:  Procedure Laterality Date   ARTHRODESIS METATARSAL Left 01/29/2021   Procedure: ARTHRODESIS TARSOMETATARSAL LEFT FOOT;  Surgeon: Edwin Cap, DPM;  Location: [facility];  Service: Podiatry;  Laterality: Left;  GENERAL WITH BLOCK   BREAST SURGERY  05/09/1998   left breast biopsy   CLOSED REDUCTION METATARSAL Left 12/11/2020   Procedure: CLOSED REDUCTION METATARSAL;  Surgeon: Edwin Cap, DPM;  Location: [facility];  Service: Podiatry;  Laterality: Left;  reduction and pinning (2.55mm Steinmann pins)   COLONOSCOPY  03/16/2010   also 2017   Patient Active Problem List   Diagnosis Date Noted   Weakness of both legs 03/23/2023   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   Restless leg syndrome 09/19/2022   Depression 02/02/2021   Anxiety 02/02/2021   Constipation 12/11/2020   Incontinence of feces 03/12/2019   B12 deficiency 12/01/2017    Routine general medical examination at a health care facility 05/22/2015   Senile osteoporosis 10/05/2011   Hyperlipidemia 01/20/2009   GERD 01/20/2009   Schizophrenia, unspecified type (HCC) 07/06/2006    ONSET DATE: Referral date 05-24-23  REFERRING DIAG: R29.6 (ICD-10-CM) - Frequent falls R29.898 (ICD-10-CM) - Weakness of both legs R26.9 (ICD-10-CM) - Gait abnormality  THERAPY DIAG:  Other abnormalities of gait and mobility  Unsteadiness on feet  Muscle weakness (generalized)  Rationale for Evaluation and Treatment: Rehabilitation  SUBJECTIVE:  SUBJECTIVE STATEMENT: Pt reports no problems - did the exercises at home, had problem with one of them but can't remember which one - sister says she will do them with her and determine which one she is referring to (SLS?) Pt accompanied by: family member - sister   PERTINENT HISTORY: medical history of HLD, schizophrenia, osteoporosis, anxiety and depression; h/o Lt ankle fracture in 2022, dyschromia  Chart note from Dr. Loleta Chance:  "Per sister, patient does not pick up her feet for about 3 years (right more than left). This fall she had more trouble walking. She has had to be helped walking. She has fallen multiple times, maybe a couple of times per month per patient. She can have difficulty getting out of a bathtub. She has burning, tingling in her legs. It can also go into her torso but not her arms. She denies neck pain but did have back pain in the past. The weakness in the legs seems to be getting worse."  PAIN:  Are you having pain? Yes - pt reports stinging and burning in both feet; reports mild pain in Rt foot at this time   PRECAUTIONS: Fall  RED FLAGS: None   WEIGHT BEARING RESTRICTIONS: No  FALLS: Has patient fallen in last 6 months? Yes.  Number of falls 10+  LIVING ENVIRONMENT: Lives with: lives alone Lives in: House/apartment Stairs: Yes: External: 3 steps; 1 hand rail at front Has following equipment at home: Walker - 2 wheeled and Grab bars  PLOF: Independent with basic ADLs, Independent with household mobility without device, and Independent with community mobility without device  PATIENT GOALS: get stronger; improve walking  OBJECTIVE:  Note: Objective measures were completed at Evaluation unless otherwise noted.  DIAGNOSTIC FINDINGS: MPRESSION: Lumbar spine x-ray 03-23-23 1. Mild multilevel degenerative disc disease and facet hypertrophy. 2. Trace anterolisthesis of L4 on L5.    COGNITION: Overall cognitive status: Impaired pt is poor historian - sister provides medical history   SENSATION: WFL  COORDINATION: WFL's bil. LE's   POSTURE: rounded shoulders, forward head, and increased thoracic kyphosis  LOWER EXTREMITY ROM:   WFL's bil. LE's   LOWER EXTREMITY MMT:    MMT Right Eval Left Eval  Hip flexion 3+ 4  Hip extension    Hip abduction    Hip adduction    Hip internal rotation    Hip external rotation    Knee flexion 3+ 4  Knee extension 5 5  Ankle dorsiflexion 4 4  Ankle plantarflexion    Ankle inversion    Ankle eversion    (Blank rows = not tested)  BED MOBILITY:  Independent  TRANSFERS: Assistive device utilized: None  Sit to stand: Modified independence Stand to sit: Modified independence  GAIT: Gait pattern: decreased arm swing- Right, decreased arm swing- Left, decreased step length- Right, decreased step length- Left, shuffling, and trunk flexed Distance walked: approx. 100' Assistive device utilized: None Level of assistance: Modified independence Comments: pt has decreased initial heel contact bil. LE's in initial stance phase of gait  FUNCTIONAL TESTS:  5 times sit to stand: 12.12 secs from mat without UE support (performed squat move without complete return to  seated position during test on any rep) Timed up and go (TUG): 13.03 secs without device 10 meter walk test: 12.21 secs = 2.69 ft/sec without device  06/01/23 0001  Berg Balance Test  Sit to Stand 4  Standing Unsupported 4  Sitting with Back Unsupported but Feet Supported on Floor or Stool 4  Stand to Sit 4  Transfers 4  Standing Unsupported with Eyes Closed 3  Standing Unsupported with Feet Together 3  From Standing, Reach Forward with Outstretched Arm 3  From Standing Position, Pick up Object from Floor 4  From Standing Position, Turn to Look Behind Over each Shoulder 4  Turn 360 Degrees 2 (4.87 to Lt:  5.6 to Rt)  Standing Unsupported, Alternately Place Feet on Step/Stool 2  Standing Unsupported, One Foot in Front 2  Standing on One Leg 1  Total Score 44                                                                                                       TREATMENT DATE: 07-06-23  TherEx: Pt performed sit to stand 5 reps from mat table without UE support with SBA with feet on [employer] Standing strengthening exercises - hip flexion, extension, abduction and marching with 2# weight on each leg 10 reps each Heel raises 10 reps  TherAct: SLS activities - performed cone taps to 5 cones with CGA: fig. 8 around cones with SBA to improve balance with turning Pt performed ladder negotiation on floor - cues to take big step to clear the rungs - 4 reps with CGA Marching in place on floor 10 reps each leg with CGA Rockerboard inside // bars - anterior/posterior 10 reps x 2 sets:  laterally 10 reps with UE support on // bars Stepped over/back of balance beam 10 reps each leg with min HHA  Gait: Pt gait trained 115' without device with verbal cues to keep feet separated and to place heels down first to reduce foot flat in stance Step negotiation - 4 steps - bil. Rails used - SBA - step over step sequence with ascension, step by step with descension      Medbridge Access Code: [email or web address] URL: [email or web address] Date: 06/11/2023 Prepared by: Maebelle Munroe  Exercises - Sit to Stand  - 1 x daily - 7 x weekly - 1 sets - 10 reps - Standing Marching  - 1 x daily - 7 x weekly - 1 sets - 10 reps - Standing Hip Abduction  - 1 x daily - 7 x weekly - 1 sets - 10 reps - Standing Hip Flexion with Counter Support  - 1 x daily - 7 x weekly - 1 sets - 10 reps - Standing Hip Extension with Counter Support  - 1 x daily - 7 x weekly - 1 sets - 10 reps - Single Leg Stance with Support  - 1 x daily - 7 x weekly - 1 sets - 1-2 reps - 10 sec hold - Heel Raises with Counter Support  - 1 x daily - 7 x weekly - 1 sets - 10 reps - Single Leg Heel Raise with Chair Support  - 1 x daily - 7 x weekly - 1 sets - 10 reps  Gait: Pt gait  trained 35' with CGA without device with verbal cues to place heels down first in stance rather than forefoot  PATIENT EDUCATION: Education details: eval results and POC; instructed pt and sister in need to work on more upright posture and initial heel contact at stance in ambulation; recommended LifeAlert and portable handset for land line phone; also discussed benefit of rollator with seat for community ambulation Person educated: Patient and sister Education method: Explanation Education comprehension: verbalized understanding  HOME EXERCISE PROGRAM: To be established   GOALS: Goals reviewed with patient? Yes  SHORT TERM GOALS:   Target date: 07-03-23 (3 weeks)  Independent in HEP for balance and strengthening exercises. Baseline:  Goal status: Goal met 07-06-23   2.   Pt will verbalize 2 techniques to improve gait:  need to stand erect and increased heel contact for improved ambulation Baseline:  Goal status: Goal met 07-06-23   LONG TERM GOALS: Target date: 07-14-23 (6 weeks)  Pt will amb. 115' without device with upright posture and with minimal shuffling gait to improve gait and reduce fall risk. Baseline:   Goal status: INITIAL  2.  Pt will transfer floor to stand with UE support on object with SBA. Baseline:  Goal status: INITIAL  3.  Improve Berg balance test score to >/= 49/56 to reduce fall risk. Baseline:  44/56 on 06-01-23 Goal status: INITIAL  4.  Increase gait velocity to >/= 3.1 ft/sec without device to improve gait efficiency. Baseline: 2.69 ft/sec without device on 06-01-23 Goal status: INITIAL   ASSESSMENT:  CLINICAL IMPRESSION: PT session focused on balance exercises to improve SLS on each leg, gait training without device and bil. LE strengthening exercises with use of 2# weight for PRE's.  Pt's gait pattern noted to be improved with increased heel contact in initial stance phase of gait.  Continue to need cues to stand more upright to reduce postural abnormality of increased trunk flexion.  Pt has met STG's #1 and 2.  Cont with POC.   OBJECTIVE IMPAIRMENTS: Abnormal gait, decreased balance, decreased strength, and postural dysfunction.   ACTIVITY LIMITATIONS: carrying, lifting, bending, standing, stairs, and locomotion level  PARTICIPATION LIMITATIONS: meal prep, cleaning, laundry, shopping, and community activity  PERSONAL FACTORS: Behavior pattern, Past/current experiences, Time since onset of injury/illness/exacerbation, and 1-2 comorbidities: schizophrenia & osteoporosis  are also affecting patient's functional outcome.   REHAB POTENTIAL: Fair due to cognition   CLINICAL DECISION MAKING: Evolving/moderate complexity  EVALUATION COMPLEXITY: Moderate  PLAN:  PT FREQUENCY: 1x/week  PT DURATION: 6 weeks  PLANNED INTERVENTIONS: 97110-Therapeutic exercises, 97530- Therapeutic activity, [id number]- Neuromuscular re-education, [phone]- Self Care, and 25366- Gait training  PLAN FOR NEXT SESSION: gait and balance training   Shaday Rayborn, Donavan Burnet, PT 07/06/2023, 8:14 PM

## 2023-07-13 ENCOUNTER — Ambulatory Visit: Payer: Medicare Other | Attending: Neurology | Admitting: Physical Therapy

## 2023-07-13 DIAGNOSIS — M6281 Muscle weakness (generalized): Secondary | ICD-10-CM | POA: Insufficient documentation

## 2023-07-13 DIAGNOSIS — R2681 Unsteadiness on feet: Secondary | ICD-10-CM | POA: Diagnosis present

## 2023-07-13 DIAGNOSIS — R2689 Other abnormalities of gait and mobility: Secondary | ICD-10-CM | POA: Insufficient documentation

## 2023-07-13 NOTE — Therapy (Signed)
 OUTPATIENT PHYSICAL THERAPY NEURO TREATMENT NOTE   Patient Name: Nancy Brooks MRN: [id number] DOB:1951/03/30, 74 y.o., female Today's Date: 07/14/2023   PCP: Myrlene Broker., MD REFERRING PROVIDER: Antony Madura, MD  END OF SESSION:  PT End of Session - 07/14/23 1600     Visit Number 5    Number of Visits 7    Date for PT Re-Evaluation 07/14/23    Authorization Type Medicare    Authorization Time Period 06-01-23 - 08-07-23    PT Start Time 1405    PT Stop Time 1448    PT Time Calculation (min) 43 min    Equipment Utilized During Treatment Gait belt    Activity Tolerance Patient tolerated treatment well    Behavior During Therapy Flat affect                Past Medical History:  Diagnosis Date   Anxiety    BURSITIS, RIGHT KNEE    Depression    Dyskinesia of esophagus    GAIT DISTURBANCE    GERD    HEMORRHOIDS, NOS    HYPERLIPIDEMIA    MENOPAUSAL SYNDROME    Osteoporosis dx 03/2011   DEXA at gyn, started boniva in additon to Ca+D, changed to Prolia 09/2012   SCHIZOPHRENIA    SYMPTOM, INCONTINENCE, URGE    Past Surgical History:  Procedure Laterality Date   ARTHRODESIS METATARSAL Left 01/29/2021   Procedure: ARTHRODESIS TARSOMETATARSAL LEFT FOOT;  Surgeon: Edwin Cap, DPM;  Location: [facility];  Service: Podiatry;  Laterality: Left;  GENERAL WITH BLOCK   BREAST SURGERY  05/09/1998   left breast biopsy   CLOSED REDUCTION METATARSAL Left 12/11/2020   Procedure: CLOSED REDUCTION METATARSAL;  Surgeon: Edwin Cap, DPM;  Location: [facility];  Service: Podiatry;  Laterality: Left;  reduction and pinning (2.15mm Steinmann pins)   COLONOSCOPY  03/16/2010   also 2017   Patient Active Problem List   Diagnosis Date Noted   Weakness of both legs 03/23/2023   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   Restless leg syndrome 09/19/2022   Depression 02/02/2021   Anxiety 02/02/2021   Constipation 12/11/2020   Incontinence of feces 03/12/2019   B12 deficiency 12/01/2017    Routine general medical examination at a health care facility 05/22/2015   Senile osteoporosis 10/05/2011   Hyperlipidemia 01/20/2009   GERD 01/20/2009   Schizophrenia, unspecified type (HCC) 07/06/2006    ONSET DATE: Referral date 05-24-23  REFERRING DIAG: R29.6 (ICD-10-CM) - Frequent falls R29.898 (ICD-10-CM) - Weakness of both legs R26.9 (ICD-10-CM) - Gait abnormality  THERAPY DIAG:  Other abnormalities of gait and mobility  Unsteadiness on feet  Muscle weakness (generalized)  Rationale for Evaluation and Treatment: Rehabilitation  SUBJECTIVE:  SUBJECTIVE STATEMENT: Pt reports no changes - sister states she did the exercises with her and says it is the standing on one leg exercise that pt is unable to do - needs to hold on; pt and sister instructed to continue to do this exercise with holding onto counter or sturdy object for support  Pt accompanied by: family member - sister   PERTINENT HISTORY: medical history of HLD, schizophrenia, osteoporosis, anxiety and depression; h/o Lt ankle fracture in 2022, dyschromia  Chart note from Dr. Loleta Chance:  "Per sister, patient does not pick up her feet for about 3 years (right more than left). This fall she had more trouble walking. She has had to be helped walking. She has fallen multiple times, maybe a couple of times per month per patient. She can have difficulty getting out of a bathtub. She has burning, tingling in her legs. It can also go into her torso but not her arms. She denies neck pain but did have back pain in the past. The weakness in the legs seems to be getting worse."  PAIN:  Are you having pain? Yes - pt reports stinging and burning in both feet; reports mild pain in Rt foot at this time   PRECAUTIONS: Fall  RED FLAGS: None   WEIGHT BEARING  RESTRICTIONS: No  FALLS: Has patient fallen in last 6 months? Yes. Number of falls 10+  LIVING ENVIRONMENT: Lives with: lives alone Lives in: House/apartment Stairs: Yes: External: 3 steps; 1 hand rail at front Has following equipment at home: Walker - 2 wheeled and Grab bars  PLOF: Independent with basic ADLs, Independent with household mobility without device, and Independent with community mobility without device  PATIENT GOALS: get stronger; improve walking  OBJECTIVE:  Note: Objective measures were completed at Evaluation unless otherwise noted.  DIAGNOSTIC FINDINGS: MPRESSION: Lumbar spine x-ray 03-23-23 1. Mild multilevel degenerative disc disease and facet hypertrophy. 2. Trace anterolisthesis of L4 on L5.    COGNITION: Overall cognitive status: Impaired pt is poor historian - sister provides medical history   SENSATION: WFL  COORDINATION: WFL's bil. LE's   POSTURE: rounded shoulders, forward head, and increased thoracic kyphosis  LOWER EXTREMITY ROM:   WFL's bil. LE's   LOWER EXTREMITY MMT:    MMT Right Eval Left Eval  Hip flexion 3+ 4  Hip extension    Hip abduction    Hip adduction    Hip internal rotation    Hip external rotation    Knee flexion 3+ 4  Knee extension 5 5  Ankle dorsiflexion 4 4  Ankle plantarflexion    Ankle inversion    Ankle eversion    (Blank rows = not tested)  BED MOBILITY:  Independent  TRANSFERS: Assistive device utilized: None  Sit to stand: Modified independence Stand to sit: Modified independence  GAIT: Gait pattern: decreased arm swing- Right, decreased arm swing- Left, decreased step length- Right, decreased step length- Left, shuffling, and trunk flexed Distance walked: approx. 100' Assistive device utilized: None Level of assistance: Modified independence Comments: pt has decreased initial heel contact bil. LE's in initial stance phase of gait  FUNCTIONAL TESTS:  5 times sit to stand: 12.12 secs from  mat without UE support (performed squat move without complete return to seated position during test on any rep) Timed up and go (TUG): 13.03 secs without device 10 meter walk test: 12.21 secs = 2.69 ft/sec without device  06/01/23 0001  Berg Balance Test  Sit to Stand 4  Standing Unsupported 4  Sitting with Back Unsupported but Feet Supported on Floor or Stool 4  Stand to Sit 4  Transfers 4  Standing Unsupported with Eyes Closed 3  Standing Unsupported with Feet Together 3  From Standing, Reach Forward with Outstretched Arm 3  From Standing Position, Pick up Object from Floor 4  From Standing Position, Turn to Look Behind Over each Shoulder 4  Turn 360 Degrees 2 (4.87 to Lt:  5.6 to Rt)  Standing Unsupported, Alternately Place Feet on Step/Stool 2  Standing Unsupported, One Foot in Front 2  Standing on One Leg 1  Total Score 44                                                                                                       TREATMENT DATE: 07-13-23  TherEx: Pt performed sit to stand 5 reps from mat table without UE support with SBA with feet on Airex Step up exercise RLE and LLE - 10 reps each leg onto 6" step - used bil. Hand rails for support for assist with balance Heel raises 10 reps  TherAct: Rockerboard inside // bars - anterior/posterior 10 reps x 2 sets:  laterally 10 reps with UE support on // bars Stepped over/back of balance beam 10 reps each leg with min HHA  Pt carried 5# weight in plastic crate 115' around track - no LOB with this activity  Pt stood on doubled blue mat for compliant surface training - reached for 5 bean bags on mat - placed on Rt side and Lt side - retrieved each one individually and tossed in basket for increased balance with reaching outside BOS and for improved balance on compliant surface  Neuro Re-ed: Pt performed sit to stand 5 reps from mat table without UE support with SBA with feet on Airex - for improved  balance on compliant surface  Tap up exercise RLE and LLE to 2nd step with UE support on hand rails prn for balance recovery - to improve SLS on each leg   SLS activities - performed touching 2 balance bubbles 5 reps - straight ahead and then diagonals 5 reps each foot with UE support on counter Touched 4 colored discs - 5 reps each leg in 1/2 circle pattern; progressed to touching 3 discs in order of named colors - called by PT - minimal UE support on counter needed for assist with balance recovery Marching in place on floor 10 reps each leg with CGA  TUG score 9.18 secs without device  Gait: Pt gait trained 115' without device with verbal cues to keep feet separated and to place heels down first to reduce foot flat in stance Step negotiation - 4 steps - bil. Rails used - SBA - step over step sequence with ascension, step by step with descension     Medbridge Access Code: [email or web address] URL: [email or web address] Date: 06/11/2023 Prepared by: Maebelle Munroe  Exercises - Sit to Stand  - 1 x daily - 7 x weekly -  1 sets - 10 reps - Standing Marching  - 1 x daily - 7 x weekly - 1 sets - 10 reps - Standing Hip Abduction  - 1 x daily - 7 x weekly - 1 sets - 10 reps - Standing Hip Flexion with Counter Support  - 1 x daily - 7 x weekly - 1 sets - 10 reps - Standing Hip Extension with Counter Support  - 1 x daily - 7 x weekly - 1 sets - 10 reps - Single Leg Stance with Support  - 1 x daily - 7 x weekly - 1 sets - 1-2 reps - 10 sec hold - Heel Raises with Counter Support  - 1 x daily - 7 x weekly - 1 sets - 10 reps - Single Leg Heel Raise with Chair Support  - 1 x daily - 7 x weekly - 1 sets - 10 reps  Gait: Pt gait trained 115' with CGA without device with verbal cues to place heels down first in stance rather than forefoot  PATIENT EDUCATION: Education details: eval results and POC; instructed pt and sister in need to work on more upright posture and initial heel contact at stance in  ambulation; recommended LifeAlert and portable handset for land line phone; also discussed benefit of rollator with seat for community ambulation Person educated: Patient and sister Education method: Explanation Education comprehension: verbalized understanding  HOME EXERCISE PROGRAM: To be established   GOALS: Goals reviewed with patient? Yes  SHORT TERM GOALS:   Target date: 07-03-23 (3 weeks)  Independent in HEP for balance and strengthening exercises. Baseline:  Goal status: Goal met 07-06-23   2.   Pt will verbalize 2 techniques to improve gait:  need to stand erect and increased heel contact for improved ambulation Baseline:  Goal status: Goal met 07-06-23   LONG TERM GOALS: Target date: 07-14-23 (6 weeks)  Pt will amb. 115' without device with upright posture and with minimal shuffling gait to improve gait and reduce fall risk. Baseline:  Goal status: INITIAL  2.  Pt will transfer floor to stand with UE support on object with SBA. Baseline:  Goal status: INITIAL  3.  Improve Berg balance test score to >/= 49/56 to reduce fall risk. Baseline:  44/56 on 06-01-23 Goal status: INITIAL  4.  Increase gait velocity to >/= 3.1 ft/sec without device to improve gait efficiency. Baseline: 2.69 ft/sec without device on 06-01-23 Goal status: INITIAL   ASSESSMENT:  CLINICAL IMPRESSION: PT session focused on balance training on compliant surfaces and also activities to improve SLS on each leg.  Pt noted to have greater difficulty lifting LLE to tap 2nd step compared to lifting RLE but better SLS balance on LLE than on RLE with this exercise.  Pt tolerated exercises well with few short seated rest breaks needed during session.  Pt did c/o intermittent back pain with standing exercises - relieved after short seated rest periods.  Cont with POC.   OBJECTIVE IMPAIRMENTS: Abnormal gait, decreased balance, decreased strength, and postural dysfunction.   ACTIVITY LIMITATIONS: carrying,  lifting, bending, standing, stairs, and locomotion level  PARTICIPATION LIMITATIONS: meal prep, cleaning, laundry, shopping, and community activity  PERSONAL FACTORS: Behavior pattern, Past/current experiences, Time since onset of injury/illness/exacerbation, and 1-2 comorbidities: schizophrenia & osteoporosis  are also affecting patient's functional outcome.   REHAB POTENTIAL: Fair due to cognition   CLINICAL DECISION MAKING: Evolving/moderate complexity  EVALUATION COMPLEXITY: Moderate  PLAN:  PT FREQUENCY: 1x/week  PT DURATION: 6 weeks  PLANNED INTERVENTIONS: 97110-Therapeutic exercises, 97530- Therapeutic activity, [id number]- Neuromuscular re-education, [id number]- Self Care, and 41660- Gait training  PLAN FOR NEXT SESSION:  D/C next session? gait and balance training   Kimesha Claxton, Donavan Burnet, PT 07/14/2023, 4:07 PM

## 2023-07-14 ENCOUNTER — Encounter: Payer: Self-pay | Admitting: Physical Therapy

## 2023-07-14 ENCOUNTER — Encounter: Payer: Self-pay | Admitting: Psychiatry

## 2023-07-17 ENCOUNTER — Other Ambulatory Visit: Payer: Self-pay | Admitting: Obstetrics and Gynecology

## 2023-07-17 DIAGNOSIS — Z1231 Encounter for screening mammogram for malignant neoplasm of breast: Secondary | ICD-10-CM

## 2023-07-20 ENCOUNTER — Ambulatory Visit: Payer: Medicare Other | Admitting: Physical Therapy

## 2023-07-25 ENCOUNTER — Other Ambulatory Visit: Payer: Self-pay | Admitting: Psychiatry

## 2023-07-25 ENCOUNTER — Other Ambulatory Visit: Payer: Medicare Other

## 2023-08-01 ENCOUNTER — Ambulatory Visit (INDEPENDENT_AMBULATORY_CARE_PROVIDER_SITE_OTHER): Payer: Medicare Other | Admitting: Psychiatry

## 2023-08-01 ENCOUNTER — Encounter: Payer: Self-pay | Admitting: Psychiatry

## 2023-08-01 DIAGNOSIS — K5903 Drug induced constipation: Secondary | ICD-10-CM | POA: Diagnosis not present

## 2023-08-01 DIAGNOSIS — F200 Paranoid schizophrenia: Secondary | ICD-10-CM

## 2023-08-01 DIAGNOSIS — F411 Generalized anxiety disorder: Secondary | ICD-10-CM | POA: Diagnosis not present

## 2023-08-01 DIAGNOSIS — Z79899 Other long term (current) drug therapy: Secondary | ICD-10-CM | POA: Diagnosis not present

## 2023-08-01 MED ORDER — CLONAZEPAM 0.5 MG PO TABS: ORAL_TABLET | ORAL | 5 refills | Status: DC

## 2023-08-01 NOTE — Patient Instructions (Addendum)
 Can reduce clozapine blood test frequency to once every 2 months.   You will continue to get clozapine prescriptions monthly.

## 2023-08-01 NOTE — Progress Notes (Signed)
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 visit nor significantly since when she was first seen in a number of years ago.. Not able to function or self-care without the help of her sister.  Have adjusted the clozapine levels up and down without much difference in her overall symptoms.  She is currently taking clozapine 500 mg daily as she did not improve at the higher dosages.   Chronically easily overwhelmed.  But not agitated.  Needs help from sister. Needs instructions repeated. Chronic voices and intrusive disturbing thoughts wihtout change.   Disc schizophrenia causes intrusive thoughts and diminishes emotional feels for others and she has suffered this.    ANC has remained consistently normal.  No SE except constipation disc .She has chronic constipation and it's better lately.. Still takes Miralax. It's possible clozapine contributes to the problem.  Rec talk with PCP about any worsening constipation and diarrhea.  She is still taking Linzess.   Continue clozapine 500 mg daily.    ANC has been stable for clozapine RX.  Disc risk of aplastic anemia. Disc  compliance with her medications.   OK per FDA to reduce CBC to every other month.   We discussed the short-term risks associated with benzodiazepines including sedation and increased fall risk among others.  Discussed long-term side effect risk including dependence, potential withdrawal symptoms, and the potential eventual dose-related risk of dementia.  But recent studies from 2020 dispute this association between benzodiazepines and dementia risk. Newer studies in 2020  do not support an association with dementia. Her anxiety is quite severe  but not clear what change could help.   Tried switch from Librium to clonazepam . reduced to 0.5 mg BID bc lack of obvious benefit with higher dose.      not recommended but consider Luvox bc obsessive thoughts, but  Concerns about polypharmacy and drug interactions so defer.  Would like a trial of this but concerns she can't tolerate a reduction in clozapine which would likely be necessary. Consider sertraline for intrusive thoughts.    Disc driving issues.  She is chronically anxious and that affects driving confidence.  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  Was medically reviewed by [facility] and given renewed license.  02/23/22      Component Ref Range & Units 2 mo ago  Clozapine Lvl 350 - 600 ng/mL 1,542 High   NorClozapine Not Estab. ng/mL 871  Total(Cloz+Norcloz) ng/mL 2,413       Signed      After reviewing the blood level and thinking about the situation, I have decided that it is too risky medically to try the fluvoxamine augmentation that I had been considering.  Therefore continue the current medications and I do not want to make any changes.          [name]' # [phone].    Saw Dr. Okey Dupre about change in skin color of face.  No medical concerns raised.     FU every 8 weeks  Meredith Staggers, MD, DFAPA   Please see After Visit Summary for patient specific instructions.  Future Appointments  Date Time Provider Department Center   08/10/2023  1:10 PM [facility]       No orders of the defined types were placed in this encounter.      -------------------------------

## 2023-08-10 ENCOUNTER — Ambulatory Visit
Admission: RE | Admit: 2023-08-10 | Discharge: 2023-08-10 | Disposition: A | Discharge status: Home / Self Care | Source: Ambulatory Visit | Attending: Obstetrics and Gynecology | Admitting: Obstetrics and Gynecology

## 2023-08-10 DIAGNOSIS — Z1231 Encounter for screening mammogram for malignant neoplasm of breast: Secondary | ICD-10-CM

## 2023-08-31 ENCOUNTER — Other Ambulatory Visit: Payer: Self-pay

## 2023-08-31 ENCOUNTER — Other Ambulatory Visit: Payer: Self-pay | Admitting: Psychiatry

## 2023-08-31 ENCOUNTER — Telehealth: Payer: Self-pay | Admitting: Psychiatry

## 2023-08-31 DIAGNOSIS — Z79899 Other long term (current) drug therapy: Secondary | ICD-10-CM

## 2023-08-31 NOTE — Telephone Encounter (Signed)
 Sister Leary Provencal called 9:00 am. Requesting new standing order for CBC to [facility]. Expires 4/28. Print out and fax to [phone] Attn: Carles Cheadle- sister

## 2023-08-31 NOTE — Telephone Encounter (Signed)
Order entered and faxed

## 2023-09-07 LAB — CBC WITH DIFFERENTIAL/PLATELET
Basophils Absolute: 0 10*3/uL (ref 0.0–0.2)
Basos: 0 %
EOS (ABSOLUTE): 0 10*3/uL (ref 0.0–0.4)
Eos: 0 %
Hematocrit: 38.8 % (ref 34.0–46.6)
Hemoglobin: 12.5 g/dL (ref 11.1–15.9)
Immature Grans (Abs): 0 10*3/uL (ref 0.0–0.1)
Immature Granulocytes: 0 %
Lymphocytes Absolute: 1.5 10*3/uL (ref 0.7–3.1)
Lymphs: 24 %
MCH: 27.8 pg (ref 26.6–33.0)
MCHC: 32.2 g/dL (ref 31.5–35.7)
MCV: 86 fL (ref 79–97)
Monocytes Absolute: 0.5 10*3/uL (ref 0.1–0.9)
Monocytes: 8 %
Neutrophils Absolute: 4.3 10*3/uL (ref 1.4–7.0)
Neutrophils: 68 %
Platelets: 207 10*3/uL (ref 150–450)
RBC: 4.5 x10E6/uL (ref 3.77–5.28)
RDW: 14.4 % (ref 11.7–15.4)
WBC: 6.3 10*3/uL (ref 3.4–10.8)

## 2023-09-27 ENCOUNTER — Other Ambulatory Visit: Payer: Self-pay | Admitting: Physician Assistant

## 2023-10-03 NOTE — Progress Notes (Signed)
 I saw Nancy Brooks in neurology clinic on 10/11/23 in follow up for leg weakness and falls.  HPI: APOORVA Brooks is a 73 y.o. year old female with a history of HLD, schizophrenia who we last saw on 05/24/23.  To briefly review: Per sister, patient does not pick up her feet for about 3 years (right more than left). This fall she had more trouble walking. She has had to be helped walking. She has fallen multiple times, maybe a couple of times per month per patient. She can have difficulty getting out of a bathtub. She has burning, tingling in her legs. It can also go into her torso but not her arms. She denies neck pain but did have back pain in the past. The weakness in the legs seems to be getting worse.    She denies freezing. She denies tremor. She denies REM sleep disorder. Her schizophrenia has been stable for a long time.   Sister is worried because patient has a family history of PSP (mother) and MS (maternal cousin and uncle).   Patient takes ropinirole  for possible restless leg syndrome. She denies that she wants to move her legs at night.   Patient lives alone.    She does not report any constitutional symptoms like fever, night sweats, anorexia or unintentional weight loss.   EtOH use: None  Restrictive diet? No  Most recent Assessment and Plan (05/24/23): Nancy Brooks is a 73 y.o. female who presents for evaluation of falls and leg weakness. She has a relevant medical history of HLD, schizophrenia. Her neurological examination is pertinent for shuffling gait and possible diminished proprioception in her feet, but with normal strength of legs and no definitive evidence of parkinsonism. Available diagnostic data is significant for normal B12 and HbA1c. The etiology of patient's symptoms is unclear. Sister was worried about family history of MS and PSP. MS seems unlikely to explain symptoms. While parkinsonism is possible, including drug induced parkinsonism given she has been on  antipsychotics chronically, I do not see clear evidence of this on exam today. I wonder if there may be some neuropathy or radiculopathy, given history of back pain, that may explain her imbalance. It is difficult to really know given that patient is not able to give a good history. I will get labs to look for treatable causes and offered an EMG as a first step toward trying to find the etiology. She may also have RLS, but this is less clear given that she denies these symptoms currently.   PLAN: -Blood work: B1, B6, IFE, iron studies -Discussed EMG, patient will talk it over with sister -Stop ropinirole   -Start gabapentin  100 mg at bedtime -Physical therapy for gait training  Since their last visit: B1, B6, and iron was low. I recommended supplementing these. Per sister she is taking these. She went to physical therapy. Patient says she did well. Patient still has difficulty with history. She states she feels about the same. Her sister states she is falling less, with only 2 falls since the last visit. Patient has difficulty explaining why she falls.   She eats okay per patient and her sister. She does not drink a lot of water though.  Her BP is very low today with elevated HR (72/49, 107).   MEDICATIONS:  Outpatient Encounter Medications as of 10/11/2023  Medication Sig   calcium  elemental as carbonate (BARIATRIC TUMS ULTRA) 400 MG chewable tablet Chew 1,000 mg by mouth daily.   cholecalciferol (  VITAMIN D3) 25 MCG (1000 UNIT) tablet Take 1,000 Units by mouth daily.   clonazePAM  (KLONOPIN ) 0.5 MG tablet 1 tablet in the AM and 1 at suppertime   cloZAPine  (CLOZARIL ) 100 MG tablet TAKE 1 TABLET BY MOUTH EVERY MORNING AND 4 TABLETS EVERY EVENING   cyanocobalamin  (VITAMIN B12) 1000 MCG tablet Take 1 tablet (1,000 mcg total) by mouth daily.   famotidine  (PEPCID ) 10 MG tablet Take 10 mg by mouth daily.    gabapentin  (NEURONTIN ) 100 MG capsule Take 1 capsule (100 mg total) by mouth at bedtime.    LINZESS  72 MCG capsule Take 1 capsule (72 mcg total) by mouth daily.   polyethylene glycol (MIRALAX  / GLYCOLAX ) 17 g packet Take 17 g by mouth daily.   PREVIDENT 1.1 % GEL dental gel USE AS DIRECTED   simvastatin  (ZOCOR ) 40 MG tablet TAKE 1 TABLET(40 MG) BY MOUTH DAILY AT 6 PM   [DISCONTINUED] erythromycin  ophthalmic ointment Place 1 Application into both eyes at bedtime.   No facility-administered encounter medications on file as of 10/11/2023.    PAST MEDICAL HISTORY: Past Medical History:  Diagnosis Date   Anxiety    BURSITIS, RIGHT KNEE    Depression    Dyskinesia of esophagus    GAIT DISTURBANCE    GERD    HEMORRHOIDS, NOS    HYPERLIPIDEMIA    MENOPAUSAL SYNDROME    Osteoporosis dx 03/2011   DEXA at gyn, started boniva  in additon to Ca+D, changed to Prolia  09/2012   SCHIZOPHRENIA    SYMPTOM, INCONTINENCE, URGE     PAST SURGICAL HISTORY: Past Surgical History:  Procedure Laterality Date   ARTHRODESIS METATARSAL Left 01/29/2021   Procedure: ARTHRODESIS TARSOMETATARSAL LEFT FOOT;  Surgeon: Floyce Hutching, DPM;  Location: [facility];  Service: Podiatry;  Laterality: Left;  GENERAL WITH BLOCK   BREAST SURGERY  05/09/1998   left breast biopsy   CLOSED REDUCTION METATARSAL Left 12/11/2020   Procedure: CLOSED REDUCTION METATARSAL;  Surgeon: Floyce Hutching, DPM;  Location: [facility];  Service: Podiatry;  Laterality: Left;  reduction and pinning (2.4mm Steinmann pins)   COLONOSCOPY  03/16/2010   also 2017    ALLERGIES: No Known Allergies  FAMILY HISTORY: Family History  Problem Relation Age of Onset   Hypertension Mother    Depression Mother    Prostate cancer Father    Colon cancer Brother 41   Esophageal cancer Neg Hx    Pancreatic cancer Neg Hx    Rectal cancer Neg Hx    Stomach cancer Neg Hx     SOCIAL HISTORY: Social History   Tobacco Use   Smoking status: Never   Smokeless tobacco: Never  Vaping Use   Vaping status: Never Used  Substance Use Topics    Alcohol use: No    Alcohol/week: 0.0 standard drinks of alcohol   Drug use: No   Social History   Social History Narrative   Lives alone in single family home. Sister provides support and come to visits. Pt drives and indep ADLs.    Are you right handed or left handed? Right   Are you currently employed ?    What is your current occupation? none   Do you live at home alone? yes   Who lives with you?    What type of home do you live in: 1 story or 2 story? one    Caffeine 2 cups of tea    Objective:  Vital Signs:  BP (!) 80/60   Pulse Aaron Aas)  107   Ht 5' 1.5" (1.562 m)   Wt 133 lb (60.3 kg)   SpO2 91%   BMI 24.72 kg/m   General: General appearance: Awake and alert. No distress. Cooperative with exam.  Skin: Martina Sledge discoloration of face. HEENT: Atraumatic. Anicteric. Lungs: Non-labored breathing on room air  Extremities: No edema.  Neurological: Mental Status: Alert. Speech fluent. No pseudobulbar affect Cranial Nerves: CNII: No RAPD. Visual fields intact. CNIII, IV, VI: PERRL. No nystagmus. EOMI. CN V: Facial sensation intact bilaterally to fine touch. CN VII: Facial muscles symmetric and strong. No ptosis at rest. CN VIII: Hears finger rub well bilaterally. CN IX: No hypophonia. CN X: Palate elevates symmetrically. CN XI: Full strength shoulder shrug bilaterally. CN XII: Tongue protrusion full and midline. No atrophy or fasciculations. No significant dysarthria Motor: Tone is normal. Strength 5/5 in bilateral upper and lower extremities. Reflexes:  Right Left  Bicep 1+ 1+  Tricep 1+ 1+  BrRad 1+ 1+  Knee 1+ 1+  Ankle 0 0   Sensation: Pinprick intact in all extremities Coordination: Intact finger-to- nose-finger bilaterally. Finger tapping with decrement bilaterally though she had difficulty following what I was asking her to do. Romberg negative. Gait: Narrow based gait. No freezing, no en bloc turns. Will walk better when reminded to pick up feet.   Lab and  Test Review: New results: 05/24/23: IFE: no M protein B1 < 6 B6 < 2 Ferritin 37, iron sat % 14  Previously reviewed results: 09/19/22: LDL 87 TSH wnl B12: > 1500 Ferritin 39.0 CMP unremarkable   HbA1c (03/31/20): 5.6   External labs: CBC w/ diff (04/19/23) unremarkable   Imaging: Lumbar spine xray (03/23/23): FINDINGS: Five non-rib-bearing lumbar vertebra. Slight straightening of normal lordosis. Trace anterolisthesis of L4 on L5. Normal vertebral body heights. No fracture or compression deformity. Minor anterior spurring at multiple levels, mild L5-S1 disc space narrowing. Mild multilevel facet hypertrophy. No evidence of pars defects or focal bone abnormality.   IMPRESSION: 1. Mild multilevel degenerative disc disease and facet hypertrophy. 2. Trace anterolisthesis of L4 on L5.  ASSESSMENT: This is Lurena Sally, a 73 y.o. female with falls. Her prior work up was significant for B1 deficiency, B6 deficiency, and iron deficiency. She also had a prior history of B12 deficiency. She is now on supplements for all the above and may have had some mild improvement and less falls. I am concerned about her nutritional status due to these abnormalities despite being told that she eats normally. She also has very low BP and tachycardia suggesting possible dehydration. She does endorse poor water intake. I still have questions about potential parkinsonism, perhaps drug induced, but this is still unclear. I will continue to monitor and see if patient continues to improve.  Plan: -Blood work: B1, B6, B12, iron studies -Continue home PT exercises -Fall precautions -Will discuss low BP with PCP -Discussed the importance of good nutrition and hydration with plenty of water each day  Return to clinic in 6 months  Total time spent reviewing records, interview, history/exam, documentation, and coordination of care on day of encounter:  40 min  Rommie Coats, MD

## 2023-10-04 ENCOUNTER — Ambulatory Visit (INDEPENDENT_AMBULATORY_CARE_PROVIDER_SITE_OTHER)

## 2023-10-04 VITALS — BP 100/60 | HR 92 | Ht 64.0 in | Wt 132.2 lb

## 2023-10-04 DIAGNOSIS — Z0000 Encounter for general adult medical examination without abnormal findings: Secondary | ICD-10-CM

## 2023-10-04 NOTE — Patient Instructions (Signed)
 Nancy Brooks , Thank you for taking time out of your busy schedule to complete your Annual Wellness Visit with me. I enjoyed our conversation and look forward to speaking with you again next year. I, as well as your care team,  appreciate your ongoing commitment to your health goals. Please review the following plan we discussed and let me know if I can assist you in the future. Your Game plan/ To Do List    Follow up Visits: Next Medicare AWV with our clinical staff: 10/08/2024   Have you seen your provider in the last 6 months (3 months if uncontrolled diabetes)? Yes Next Office Visit with your provider: to be scheduled by the patient  Clinician Recommendations:  Aim for 30 minutes of exercise or brisk walking, 6-8 glasses of water, and 5 servings of fruits and vegetables each day.       This is a list of the screening recommended for you and due dates:  Health Maintenance  Topic Date Due   COVID-19 Vaccine (4 - 2024-25 season) 01/08/2023   Flu Shot  12/08/2023   Colon Cancer Screening  05/31/2024   Mammogram  08/09/2024   DTaP/Tdap/Td vaccine (3 - Tdap) 08/10/2024   Medicare Annual Wellness Visit  10/03/2024   Pneumonia Vaccine  Completed   DEXA scan (bone density measurement)  Completed   Hepatitis C Screening  Completed   Zoster (Shingles) Vaccine  Completed   HPV Vaccine  Aged Out   Meningitis B Vaccine  Aged Out    Advanced directives: (In Chart) A copy of your advanced directives are scanned into your chart should your provider ever need it. Advance Care Planning is important because it:  [x]  Makes sure you receive the medical care that is consistent with your values, goals, and preferences  [x]  It provides guidance to your family and loved ones and reduces their decisional burden about whether or not they are making the right decisions based on your wishes.  Follow the link provided in your after visit summary or read over the paperwork we have mailed to you to help you  started getting your Advance Directives in place. If you need assistance in completing these, please reach out to us  so that we can help you!

## 2023-10-04 NOTE — Progress Notes (Signed)
 Subjective:  Please attest and cosign this visit due to patients primary care provider not being in the office at the time the visit was completed.  (Pt of Dr Bambi Lever)   Nancy Brooks is a 73 y.o. who presents for a Medicare Wellness preventive visit.  As a reminder, Annual Wellness Visits don't include a physical exam, and some assessments may be limited, especially if this visit is performed virtually. We may recommend an in-person follow-up visit with your provider if needed.  Visit Complete: In person  Persons Participating in Visit: Patient assisted by Arcelia Bean.  AWV Questionnaire: Yes: Patient Medicare AWV questionnaire was completed by the patient on 10/03/2023; I have confirmed that all information answered by patient is correct and no changes since this date.  Cardiac Risk Factors include: advanced age (>33men, >55 women);dyslipidemia     Objective:     Today's Vitals   10/04/23 1519  BP: 100/60  Pulse: 92  Weight: 132 lb 3.2 oz (60 kg)  Height: 5\' 4"  (1.626 m)   Body mass index is 22.69 kg/m.     10/04/2023    3:18 PM 06/02/2023    5:11 PM 05/24/2023    2:47 PM 05/24/2021    1:52 PM 01/29/2021    2:09 PM 01/28/2021    1:17 PM 12/10/2020    6:33 PM  Advanced Directives  Does Patient Have a Medical Advance Directive? Yes Yes Yes Yes Yes Yes No  Type of Estate agent of [place];Living will Healthcare Power of [place];Living will Healthcare Power of [place];Living will Healthcare Power of [place];Living will Healthcare Power of [employer] Power of Attorney   Does patient want to make changes to medical advance directive? No - Patient declined    No - Patient declined No - Patient declined   Copy of Healthcare Power of Attorney in Chart? Yes - validated most recent copy scanned in chart (See row information) No - copy requested  No - copy requested Yes - validated most recent copy scanned in chart (See row  information) No - copy requested   Would patient like information on creating a medical advance directive?       No - Patient declined    Current Medications (verified) Outpatient Encounter Medications as of 10/04/2023  Medication Sig   calcium  elemental as carbonate (BARIATRIC TUMS ULTRA) 400 MG chewable tablet Chew 1,000 mg by mouth daily.   cholecalciferol (VITAMIN D3) 25 MCG (1000 UNIT) tablet Take 1,000 Units by mouth daily.   clonazePAM  (KLONOPIN ) 0.5 MG tablet 1 tablet in the AM and 1 at suppertime   cloZAPine  (CLOZARIL ) 100 MG tablet TAKE 1 TABLET BY MOUTH EVERY MORNING AND 4 TABLETS EVERY EVENING   cyanocobalamin  (VITAMIN B12) 1000 MCG tablet Take 1 tablet (1,000 mcg total) by mouth daily.   famotidine  (PEPCID ) 10 MG tablet Take 10 mg by mouth daily.    gabapentin  (NEURONTIN ) 100 MG capsule Take 1 capsule (100 mg total) by mouth at bedtime.   LINZESS  72 MCG capsule Take 1 capsule (72 mcg total) by mouth daily.   polyethylene glycol (MIRALAX  / GLYCOLAX ) 17 g packet Take 17 g by mouth daily.   PREVIDENT 1.1 % GEL dental gel USE AS DIRECTED   simvastatin  (ZOCOR ) 40 MG tablet TAKE 1 TABLET(40 MG) BY MOUTH DAILY AT 6 PM   [DISCONTINUED] erythromycin  ophthalmic ointment Place 1 Application into both eyes at bedtime.   No facility-administered encounter medications on file as of 10/04/2023.  Allergies (verified) Patient has no known allergies.   History: Past Medical History:  Diagnosis Date   Anxiety    BURSITIS, RIGHT KNEE    Depression    Dyskinesia of esophagus    GAIT DISTURBANCE    GERD    HEMORRHOIDS, NOS    HYPERLIPIDEMIA    MENOPAUSAL SYNDROME    Osteoporosis dx 03/2011   DEXA at gyn, started boniva  in additon to Ca+D, changed to Prolia  09/2012   SCHIZOPHRENIA    SYMPTOM, INCONTINENCE, URGE    Past Surgical History:  Procedure Laterality Date   ARTHRODESIS METATARSAL Left 01/29/2021   Procedure: ARTHRODESIS TARSOMETATARSAL LEFT FOOT;  Surgeon: Floyce Hutching,  DPM;  Location: [facility];  Service: Podiatry;  Laterality: Left;  GENERAL WITH BLOCK   BREAST SURGERY  05/09/1998   left breast biopsy   CLOSED REDUCTION METATARSAL Left 12/11/2020   Procedure: CLOSED REDUCTION METATARSAL;  Surgeon: Floyce Hutching, DPM;  Location: [facility];  Service: Podiatry;  Laterality: Left;  reduction and pinning (2.95mm Steinmann pins)   COLONOSCOPY  03/16/2010   also 2017   Family History  Problem Relation Age of Onset   Hypertension Mother    Depression Mother    Prostate cancer Father    Colon cancer Brother 25   Esophageal cancer Neg Hx    Pancreatic cancer Neg Hx    Rectal cancer Neg Hx    Stomach cancer Neg Hx    Social History   Socioeconomic History   Marital status: Single    Spouse name: Not on file   Number of children: Not on file   Years of education: Not on file   Highest education level: 12th grade  Occupational History   Not on file  Tobacco Use   Smoking status: Never   Smokeless tobacco: Never  Vaping Use   Vaping status: Never Used  Substance and Sexual Activity   Alcohol use: No    Alcohol/week: 0.0 standard drinks of alcohol   Drug use: No   Sexual activity: Not Currently  Other Topics Concern   Not on file  Social History Narrative   Lives alone in single family home. Sister provides support and come to visits. Pt drives and indep ADLs.    Are you right handed or left handed? Right   Are you currently employed ?    What is your current occupation? none   Do you live at home alone? yes   Who lives with you?    What type of home do you live in: 1 story or 2 story? one    Caffeine 2 cups of tea   Social Drivers of Health   Financial Resource Strain: Low Risk  (10/04/2023)   Overall Financial Resource Strain (CARDIA)    Difficulty of Paying Living Expenses: Not hard at all  Food Insecurity: No Food Insecurity (10/04/2023)   Hunger Vital Sign    Worried About Running Out of Food in the Last Year: Never true    Ran Out of  Food in the Last Year: Never true  Transportation Needs: No Transportation Needs (10/04/2023)   PRAPARE - Administrator, Civil Service (Medical): No    Lack of Transportation (Non-Medical): No  Physical Activity: Sufficiently Active (10/04/2023)   Exercise Vital Sign    Days of Exercise per Week: 7 days    Minutes of Exercise per Session: 30 min  Stress: Stress Concern Present (10/04/2023)   [employer] of Occupational Health -  Occupational Stress Questionnaire    Feeling of Stress : To some extent  Social Connections: Socially Isolated (10/04/2023)   Social Connection and Isolation Panel [NHANES]    Frequency of Communication with Friends and Family: More than three times a week    Frequency of Social Gatherings with Friends and Family: Once a week    Attends Religious Services: Never    Database administrator or Organizations: No    Attends Engineer, structural: Never    Marital Status: Never married    Tobacco Counseling Counseling given: No    Clinical Intake:  Pre-visit preparation completed: Yes  Pain : No/denies pain     BMI - recorded: 22.69 Nutritional Status: BMI of 19-24  Normal Nutritional Risks: None Diabetes: No  Lab Results  Component Value Date   HGBA1C 5.6 03/31/2020   HGBA1C 5.6 03/12/2019   HGBA1C 5.7 08/30/2017     How often do you need to have someone help you when you read instructions, pamphlets, or other written materials from your doctor or pharmacy?: 1 - Never  Interpreter Needed?: No  Information entered by :: Kandy Orris, CMA   Activities of Daily Living     10/04/2023    3:22 PM 10/03/2023   10:51 AM  In your present state of health, do you have any difficulty performing the following activities:  Hearing? 0 0  Vision? 0 0  Difficulty concentrating or making decisions? 1 1  Walking or climbing stairs? 0 0  Dressing or bathing? 0 0  Doing errands, shopping? 1 1  Preparing Food and eating ? N N  Using  the Toilet? N N  In the past six months, have you accidently leaked urine? N N  Do you have problems with loss of bowel control? N N  Managing your Medications? N N  Managing your Finances? N N  Housekeeping or managing your Housekeeping? Colie Dawes    Patient Care Team: Adelia Homestead, MD as PCP - General (Internal Medicine) Pietro Bridegroom, MD (Inactive) as Consulting Physician (Gastroenterology) Cottle, Kennedy Peabody., MD as Consulting Physician (Psychiatry) Ashby Lawman, MD (Obstetrics and Gynecology) Dema Filler, MD as Consulting Physician (Ophthalmology)  Indicate any recent Medical Services you may have received from other than [facility] providers in the past year (date may be approximate).     Assessment:    This is a routine wellness examination for [place].  Hearing/Vision screen Hearing Screening - Comments:: Denies hearing difficulties   Vision Screening - Comments:: Denies vision concerns - sees Dr Juanito Norma   Goals Addressed               This Visit's Progress     Patient Stated (pt-stated)        Patient stated she would like to lose some weight and continue to exercise       Depression Screen     10/04/2023    3:24 PM 09/19/2022    3:19 PM 07/01/2021   10:45 AM 05/24/2021    1:54 PM 05/24/2021    1:51 PM 03/31/2020    1:26 PM 03/12/2019    2:54 PM  PHQ 2/9 Scores  PHQ - 2 Score 0 0 0 0 0 2 0  PHQ- 9 Score 0 0         Fall Risk     10/04/2023    3:23 PM 10/03/2023   10:51 AM 05/24/2023    2:47 PM 03/23/2023   11:00 AM 01/24/2023  3:09 PM  Fall Risk   Falls in the past year? 1 1 1  0 0  Number falls in past yr: 0 1 1 0 0  Comment at least 4      Injury with Fall? 0 0 0 0 0  Risk for fall due to : History of fall(s);Impaired balance/gait      Follow up Falls evaluation completed;Falls prevention discussed  Falls evaluation completed Falls evaluation completed Falls evaluation completed    MEDICARE RISK AT HOME:  Medicare Risk at Home Any  stairs in or around the home?: No If so, are there any without handrails?: No Home free of loose throw rugs in walkways, pet beds, electrical cords, etc?: Yes Adequate lighting in your home to reduce risk of falls?: Yes Life alert?: No Use of a cane, walker or w/c?: No Grab bars in the bathroom?: Yes Shower chair or bench in shower?: No Elevated toilet seat or a handicapped toilet?: Yes  TIMED UP AND GO:  Was the test performed?  No  Cognitive Function: 6CIT completed        10/04/2023    3:27 PM  6CIT Screen  What Year? 0 points  What month? 0 points  What time? 0 points  Count back from 20 0 points  Months in reverse 0 points  Repeat phrase 2 points  Total Score 2 points    Immunizations Immunization History  Administered Date(s) Administered   Fluad Quad(high Dose 65+) 03/31/2020, 03/13/2022   Fluad Trivalent(High Dose 65+) 03/23/2023   Influenza Inj Mdck Quad Pf 03/12/2017   Influenza Split 02/02/2011, 03/03/2013   Influenza, High Dose Seasonal PF 02/03/2019   Influenza,inj,Quad PF,6+ Mos 02/24/2016   Influenza-Unspecified 03/22/2015, 03/12/2017, 02/25/2018, 02/21/2019   Moderna SARS-COV2 Booster Vaccination 08/19/2020   Moderna Sars-Covid-2 Vaccination 06/11/2019, 07/11/2019, 03/02/2020   Pneumococcal Conjugate-13 03/12/2019   Pneumococcal Polysaccharide-23 03/31/2020   Td 12/12/2003, 08/11/2014   Zoster Recombinant(Shingrix) 09/11/2016, 12/17/2016   Zoster, Live 10/23/2013    Screening Tests Health Maintenance  Topic Date Due   COVID-19 Vaccine (4 - 2024-25 season) 01/08/2023   INFLUENZA VACCINE  12/08/2023   Colonoscopy  05/31/2024   MAMMOGRAM  08/09/2024   DTaP/Tdap/Td (3 - Tdap) 08/10/2024   Medicare Annual Wellness (AWV)  10/03/2024   Pneumonia Vaccine 57+ Years old  Completed   DEXA SCAN  Completed   Hepatitis C Screening  Completed   Zoster Vaccines- Shingrix  Completed   HPV VACCINES  Aged Out   Meningococcal B Vaccine  Aged Out    Health  Maintenance  Health Maintenance Due  Topic Date Due   COVID-19 Vaccine (4 - 2024-25 season) 01/08/2023   Health Maintenance Items Addressed: 10/04/2023   Additional Screening:  Vision Screening: Recommended annual ophthalmology exams for early detection of glaucoma and other disorders of the eye.  Dental Screening: Recommended annual dental exams for proper oral hygiene  Community Resource Referral / Chronic Care Management: CRR required this visit?  No   CCM required this visit?  No   Plan:    I have personally reviewed and noted the following in the patient's chart:   Medical and social history Use of alcohol, tobacco or illicit drugs  Current medications and supplements including opioid prescriptions. Patient is not currently taking opioid prescriptions. Functional ability and status Nutritional status Physical activity Advanced directives List of other physicians Hospitalizations, surgeries, and ER visits in previous 12 months Vitals Screenings to include cognitive, depression, and falls Referrals and appointments  In addition,  I have reviewed and discussed with patient certain preventive protocols, quality metrics, and best practice recommendations. A written personalized care plan for preventive services as well as general preventive health recommendations were provided to patient.   Patria Bookbinder, CMA   10/04/2023   After Visit Summary: (MyChart) Due to this being a telephonic visit, the after visit summary with patients personalized plan was offered to patient via MyChart   Notes: Nothing significant to report at this time.

## 2023-10-11 ENCOUNTER — Other Ambulatory Visit

## 2023-10-11 ENCOUNTER — Encounter: Payer: Self-pay | Admitting: Neurology

## 2023-10-11 ENCOUNTER — Ambulatory Visit (INDEPENDENT_AMBULATORY_CARE_PROVIDER_SITE_OTHER): Admitting: Neurology

## 2023-10-11 VITALS — BP 80/60 | HR 107 | Ht 61.5 in | Wt 133.0 lb

## 2023-10-11 DIAGNOSIS — E611 Iron deficiency: Secondary | ICD-10-CM

## 2023-10-11 DIAGNOSIS — R269 Unspecified abnormalities of gait and mobility: Secondary | ICD-10-CM

## 2023-10-11 DIAGNOSIS — E519 Thiamine deficiency, unspecified: Secondary | ICD-10-CM

## 2023-10-11 DIAGNOSIS — E531 Pyridoxine deficiency: Secondary | ICD-10-CM

## 2023-10-11 DIAGNOSIS — R296 Repeated falls: Secondary | ICD-10-CM

## 2023-10-11 DIAGNOSIS — E538 Deficiency of other specified B group vitamins: Secondary | ICD-10-CM

## 2023-10-11 NOTE — Patient Instructions (Signed)
 I am concerned about your low blood pressure and a lot of vitamin deficiencies. This could be causing your problems getting around and your falls. We discussed making sure you eat properly and hydrate with plenty of water every day.  I will recheck your labs again today. I will be in touch when I have the results.  Continue your B1 (thiamine), B6, and iron supplements until you hear otherwise from me.  Discuss your low blood pressure with your primary care doctor and psychiatrist to make sure there are no adjustments to medications needed.  I will see you again in about 6 months.  The physicians and staff at [facility] are committed to providing excellent care. You may receive a survey requesting feedback about your experience at our office. We strive to receive "very good" responses to the survey questions. If you feel that your experience would prevent you from giving the office a "very good " response, please contact our office to try to remedy the situation. We may be reached at [phone]. Thank you for taking the time out of your busy day to complete the survey.  Rommie Coats, MD [facility]  Preventing Falls at [facility] are common, often dreaded events in the lives of older people. Aside from the obvious injuries and even death that may result, fall can cause wide-ranging consequences including loss of independence, mental decline, decreased activity and mobility. Younger people are also at risk of falling, especially those with chronic illnesses and fatigue.  Ways to reduce risk for falling Examine diet and medications. Warm foods and alcohol dilate blood vessels, which can lead to dizziness when standing. Sleep aids, antidepressants and pain medications can also increase the likelihood of a fall.  Get a vision exam. Poor vision, cataracts and glaucoma increase the chances of falling.  Check foot gear. Shoes should fit snugly and have a sturdy, nonskid sole and a  broad, low heel  Participate in a physician-approved exercise program to build and maintain muscle strength and improve balance and coordination. Programs that use ankle weights or stretch bands are excellent for muscle-strengthening. Water aerobics programs and low-impact Tai Chi programs have also been shown to improve balance and coordination.  Increase vitamin D  intake. Vitamin D  improves muscle strength and increases the amount of calcium  the body is able to absorb and deposit in bones.  How to prevent falls from common hazards Floors - Remove all loose wires, cords, and throw rugs. Minimize clutter. Make sure rugs are anchored and smooth. Keep furniture in its usual place.  Chairs -- Use chairs with straight backs, armrests and firm seats. Add firm cushions to existing pieces to add height.  Bathroom - Install grab bars and non-skid tape in the tub or shower. Use a bathtub transfer bench or a shower chair with a back support Use an elevated toilet seat and/or safety rails to assist standing from a low surface. Do not use towel racks or bathroom tissue holders to help you stand.  Lighting - Make sure halls, stairways, and entrances are well-lit. Install a night light in your bathroom or hallway. Make sure there is a light switch at the top and bottom of the staircase. Turn lights on if you get up in the middle of the night. Make sure lamps or light switches are within reach of the bed if you have to get up during the night.  Kitchen - Install non-skid rubber mats near the sink and stove. Clean spills immediately. Store frequently  used utensils, pots, pans between waist and eye level. This helps prevent reaching and bending. Sit when getting things out of lower cupboards.  Living room/ Bedrooms - Place furniture with wide spaces in between, giving enough room to move around. Establish a route through the living room that gives you something to hold onto as you walk.  Stairs - Make sure treads,  rails, and rugs are secure. Install a rail on both sides of the stairs. If stairs are a threat, it might be helpful to arrange most of your activities on the lower level to reduce the number of times you must climb the stairs.  Entrances and doorways - Install metal handles on the walls adjacent to the doorknobs of all doors to make it more secure as you travel through the doorway.  Tips for maintaining balance Keep at least one hand free at all times. Try using a backpack or fanny pack to hold things rather than carrying them in your hands. Never carry objects in both hands when walking as this interferes with keeping your balance.  Attempt to swing both arms from front to back while walking. This might require a conscious effort if Parkinson's disease has diminished your movement. It will, however, help you to maintain balance and posture, and reduce fatigue.  Consciously lift your feet off of the ground when walking. Shuffling and dragging of the feet is a common culprit in losing your balance.  When trying to navigate turns, use a "U" technique of facing forward and making a wide turn, rather than pivoting sharply.  Try to stand with your feet shoulder-length apart. When your feet are close together for any length of time, you increase your risk of losing your balance and falling.  Do one thing at a time. Don't try to walk and accomplish another task, such as reading or looking around. The decrease in your automatic reflexes complicates motor function, so the less distraction, the better.  Do not wear rubber or gripping soled shoes, they might "catch" on the floor and cause tripping.  Move slowly when changing positions. Use deliberate, concentrated movements and, if needed, use a grab bar or walking aid. Count 15 seconds between each movement. For example, when rising from a seated position, wait 15 seconds after standing to begin walking.  If balance is a continuous problem, you might want  to consider a walking aid such as a cane, walking stick, or walker. Once you've mastered walking with help, you might be ready to try it on your own again.

## 2023-10-16 ENCOUNTER — Ambulatory Visit: Payer: Self-pay | Admitting: Neurology

## 2023-10-16 LAB — VITAMIN B1: Vitamin B1 (Thiamine): 199 nmol/L — ABNORMAL HIGH (ref 8–30)

## 2023-10-16 LAB — IRON,TIBC AND FERRITIN PANEL
%SAT: 28 % (ref 16–45)
Ferritin: 34 ng/mL (ref 16–288)
Iron: 102 ug/dL (ref 45–160)
TIBC: 369 ug/dL (ref 250–450)

## 2023-10-16 LAB — VITAMIN B6: Vitamin B6: 25.6 ng/mL — ABNORMAL HIGH (ref 2.1–21.7)

## 2023-10-16 LAB — VITAMIN B12: Vitamin B-12: 768 pg/mL (ref 200–1100)

## 2023-10-17 ENCOUNTER — Encounter: Payer: Self-pay | Admitting: Psychiatry

## 2023-10-17 ENCOUNTER — Ambulatory Visit: Admitting: Psychiatry

## 2023-10-17 DIAGNOSIS — F200 Paranoid schizophrenia: Secondary | ICD-10-CM | POA: Diagnosis not present

## 2023-10-17 DIAGNOSIS — K5903 Drug induced constipation: Secondary | ICD-10-CM | POA: Diagnosis not present

## 2023-10-17 DIAGNOSIS — Z79899 Other long term (current) drug therapy: Secondary | ICD-10-CM

## 2023-10-17 DIAGNOSIS — G2581 Restless legs syndrome: Secondary | ICD-10-CM

## 2023-10-17 DIAGNOSIS — F411 Generalized anxiety disorder: Secondary | ICD-10-CM

## 2023-10-17 NOTE — Progress Notes (Signed)
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   LYMPHSABS 1.5 09/06/2023 1137   MONOABS 0.2 01/30/2021 0411   EOSABS 0.0 09/06/2023 1137   BASOSABS 0.0 09/06/2023 1137   ANC count has been stable. No results found for: "POCLITH", "LITHIUM"   02/23/22      Component Ref Range & Units 2 mo ago  Clozapine  Lvl 350 - 600 ng/mL 1,542 High   NorClozapine Not Estab. ng/mL 871  Total(Cloz+Norcloz) ng/mL 2,413      Latest Reference Range & Units 05/21/08 20:56 09/19/22 15:51 05/24/23 16:00 10/11/23 14:56  Iron 45 - 160 mcg/dL   43 (L) 161  TIBC 096 - 450 mcg/dL (calc)   045 409  %SAT 16 - 45 % (calc)   14 (L) 28  Ferritin 16 - 288 ng/mL 50 39.0 37 34  (L): Data is abnormally low  .res Assessment: Plan:    Nancy Brooks was seen today for follow-up, hallucinations, anxiety and paranoid.  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   30 min appt.  We discussed her Chronic severe schizophrenic symptoms of paranoia, AH and negative symptoms.  She has not improved nor changed significantly since the last visit nor significantly since when she was first seen in a number of years ago.. Not able  to function or self-care without the help of her sister.  Have adjusted the clozapine  levels up and down without much difference in her overall symptoms.  She is currently taking clozapine  500  mg daily as she did not improve at the higher dosages.   Chronically easily overwhelmed.  But not agitated.  Needs help from sister. Needs instructions repeated. Chronic voices and intrusive disturbing thoughts wihtout change.   Disc schizophrenia causes intrusive thoughts and diminishes emotional feels for others and she has suffered this.    ANC has remained consistently normal.  No SE except constipation disc .She has chronic constipation and it's better lately.. Still takes Miralax . It's possible clozapine  contributes to the problem.  Rec talk with PCP about any worsening constipation and diarrhea.  She is still taking Linzess .   Continue clozapine  500 mg daily.    ANC has been stable for clozapine  RX.  Disc risk of aplastic anemia. Disc compliance with her medications.   OK per FDA to reduce CBC to every other month.   Re: restlessness at night.  Normal iron studies.  Taking gabapentin  100 HS.  Consider increasing but it is brief symptomatically.  We discussed the short-term risks associated with benzodiazepines including sedation and increased fall risk among others.  Discussed long-term side effect risk including dependence, potential withdrawal symptoms, and the potential eventual dose-related risk of dementia.  But recent studies from 2020 dispute this association between benzodiazepines and dementia risk. Newer studies in 2020 do not support an association with dementia. Her anxiety is quite severe  but not clear what change could help.   Tried switch from Librium  to clonazepam  . reduced to 0.5 mg BID bc lack of obvious benefit with higher dose.      not recommended but consider Luvox bc obsessive thoughts, but  Concerns about polypharmacy and drug interactions so defer.  Would like a trial of this  but concerns she can't tolerate a reduction in clozapine  which would likely be necessary. Consider sertraline for intrusive thoughts.    Disc driving issues.  She is chronically anxious and that affects driving confidence.  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  Was medically reviewed by [facility] and given renewed license.  02/23/22      Component Ref Range & Units 2 mo ago  Clozapine  Lvl 350 - 600 ng/mL 1,542 High   NorClozapine Not Estab. ng/mL 871  Total(Cloz+Norcloz) ng/mL 2,413        After reviewing the blood level and thinking about the situation, I have decided that it is too risky medically to try the fluvoxamine augmentation that I had been considering.  Therefore continue the current medications and I do not want to make any changes.          Leary Provencal' # [phone].    Saw Dr. Nicolette Barrio about change in skin color of face.  No medical concerns raised.     FU every 8 weeks  Nori Beat, MD, DFAPA   Please see After Visit Summary for patient specific instructions.  Future Appointments  Date Time Provider Department Center  11/20/2023  2:20 PM [facility]NU[facility] VISIT 2 [facility] None  11/21/2023  3:40 PM Adelia Homestead, MD [facility] None  04/17/2024  2:30 PM Ellene Gustin, MD [facility] None       No orders of the defined types were placed in this encounter.      -------------------------------

## 2023-10-17 NOTE — Patient Instructions (Addendum)
 If you get a letter from the [facility], get a copy to Dr. Toi Foster and to [place].

## 2023-10-27 ENCOUNTER — Other Ambulatory Visit: Payer: Self-pay | Admitting: Internal Medicine

## 2023-11-02 ENCOUNTER — Encounter: Payer: Self-pay | Admitting: Psychiatry

## 2023-11-20 ENCOUNTER — Ambulatory Visit

## 2023-11-21 ENCOUNTER — Ambulatory Visit: Admitting: Internal Medicine

## 2023-12-04 ENCOUNTER — Encounter: Payer: Self-pay | Admitting: Internal Medicine

## 2023-12-04 ENCOUNTER — Ambulatory Visit (INDEPENDENT_AMBULATORY_CARE_PROVIDER_SITE_OTHER): Admitting: Internal Medicine

## 2023-12-04 VITALS — BP 102/62 | HR 107 | Temp 97.9°F | Ht 61.5 in | Wt 131.4 lb

## 2023-12-04 DIAGNOSIS — E785 Hyperlipidemia, unspecified: Secondary | ICD-10-CM

## 2023-12-04 DIAGNOSIS — F209 Schizophrenia, unspecified: Secondary | ICD-10-CM | POA: Diagnosis not present

## 2023-12-04 DIAGNOSIS — L819 Disorder of pigmentation, unspecified: Secondary | ICD-10-CM | POA: Diagnosis not present

## 2023-12-04 DIAGNOSIS — E538 Deficiency of other specified B group vitamins: Secondary | ICD-10-CM | POA: Diagnosis not present

## 2023-12-04 DIAGNOSIS — M810 Age-related osteoporosis without current pathological fracture: Secondary | ICD-10-CM

## 2023-12-04 DIAGNOSIS — K5904 Chronic idiopathic constipation: Secondary | ICD-10-CM

## 2023-12-04 NOTE — Assessment & Plan Note (Signed)
 Levels stable and taking supplements.

## 2023-12-04 NOTE — Assessment & Plan Note (Signed)
 Checked EKG for new baseline which is stable from prior without changes.

## 2023-12-04 NOTE — Assessment & Plan Note (Signed)
 Stable and controlled seeing mental health. Recent labs stable.

## 2023-12-04 NOTE — Assessment & Plan Note (Signed)
 Using miralax  and linzess  and sometimes gets diarrhea when balance of meds is not right.

## 2023-12-04 NOTE — Patient Instructions (Signed)
The EKG looks normal.    

## 2023-12-04 NOTE — Assessment & Plan Note (Signed)
 Stable without change

## 2023-12-04 NOTE — Assessment & Plan Note (Signed)
 S/P boniva  and due DEXA 2026

## 2023-12-04 NOTE — Progress Notes (Signed)
   Subjective:   Patient ID: Nancy Brooks, female    DOB: 1951-01-10, 73 y.o.   MRN: [id number]  HPI The patient is a 73 YO female coming in for medical management (see A/P for details).   Review of Systems  Constitutional: Negative.  Negative for activity change, appetite change and unexpected weight change.  HENT: Negative.    Eyes: Negative.   Respiratory:  Negative for cough, chest tightness and shortness of breath.   Cardiovascular:  Negative for chest pain, palpitations and leg swelling.  Gastrointestinal:  Negative for abdominal distention, abdominal pain, constipation, diarrhea, nausea and vomiting.  Musculoskeletal: Negative.   Skin: Negative.   Neurological: Negative.   Psychiatric/Behavioral: Negative.      Objective:  Physical Exam Constitutional:      Appearance: She is well-developed.  HENT:     Head: Normocephalic and atraumatic.     Comments: Color change to face stable from prior Cardiovascular:     Rate and Rhythm: Normal rate and regular rhythm.  Pulmonary:     Effort: Pulmonary effort is normal. No respiratory distress.     Breath sounds: Normal breath sounds. No wheezing or rales.  Abdominal:     General: Bowel sounds are normal. There is no distension.     Palpations: Abdomen is soft.     Tenderness: There is no abdominal tenderness. There is no rebound.  Musculoskeletal:     Cervical back: Normal range of motion.  Skin:    General: Skin is warm and dry.  Neurological:     Mental Status: She is alert and oriented to person, place, and time.     Coordination: Coordination normal.     Vitals:   12/04/23 1453  BP: 102/62  Pulse: (!) 107  Temp: 97.9 F (36.6 C)  TempSrc: Temporal  SpO2: 96%  Weight: 131 lb 6 oz (59.6 kg)  Height: 5' 1.5 (1.562 m)   EKG: Rate 103, axis normal, interval normal [id number], sinus tachy, no st or t wave changes, no significant change compared to prior 2015   Assessment & Plan:

## 2023-12-11 ENCOUNTER — Ambulatory Visit

## 2023-12-15 ENCOUNTER — Other Ambulatory Visit: Payer: Self-pay | Admitting: Neurology

## 2023-12-15 DIAGNOSIS — R209 Unspecified disturbances of skin sensation: Secondary | ICD-10-CM

## 2023-12-18 NOTE — Telephone Encounter (Signed)
 Last OV-10/11/23 Last refill--05/24/23 Please advise

## 2023-12-22 ENCOUNTER — Other Ambulatory Visit: Payer: Self-pay | Admitting: Physician Assistant

## 2023-12-26 ENCOUNTER — Ambulatory Visit (INDEPENDENT_AMBULATORY_CARE_PROVIDER_SITE_OTHER): Admitting: Psychiatry

## 2023-12-26 ENCOUNTER — Encounter: Payer: Self-pay | Admitting: Psychiatry

## 2023-12-26 DIAGNOSIS — F411 Generalized anxiety disorder: Secondary | ICD-10-CM | POA: Diagnosis not present

## 2023-12-26 DIAGNOSIS — G2581 Restless legs syndrome: Secondary | ICD-10-CM | POA: Diagnosis not present

## 2023-12-26 DIAGNOSIS — F200 Paranoid schizophrenia: Secondary | ICD-10-CM | POA: Diagnosis not present

## 2023-12-26 DIAGNOSIS — E538 Deficiency of other specified B group vitamins: Secondary | ICD-10-CM

## 2023-12-26 DIAGNOSIS — Z79899 Other long term (current) drug therapy: Secondary | ICD-10-CM

## 2023-12-26 MED ORDER — CLONAZEPAM 0.5 MG PO TABS: ORAL_TABLET | ORAL | 5 refills | Status: AC

## 2023-12-26 NOTE — Progress Notes (Signed)
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 1551   BUN 25 (H) 09/19/2022 1551   CREATININE 0.93 09/19/2022 1551   CALCIUM  9.0 09/19/2022 1551   PROT 6.4 09/19/2022 1551   ALBUMIN 4.2 09/19/2022 1551   AST 20 09/19/2022 1551   ALT 22 09/19/2022 1551   ALKPHOS 95 09/19/2022 1551   BILITOT 0.3 09/19/2022 1551   GFRNONAA >60 01/30/2021 0411       Component Value Date/Time   WBC 6.3 09/06/2023 1137   WBC 7.2 08/03/2021 1458   RBC 4.50 09/06/2023 1137   RBC 4.23 08/03/2021 1458   HGB 12.5 09/06/2023 1137   HCT 38.8 09/06/2023 1137   PLT 207 09/06/2023 1137   MCV 86 09/06/2023 1137   MCH 27.8 09/06/2023 1137   MCH 28.8 02/02/2021 0458   MCHC 32.2 09/06/2023 1137   MCHC 33.6 08/03/2021 1458   RDW 14.4 09/06/2023 1137   LYMPHSABS 1.5 09/06/2023 1137   MONOABS 0.2 01/30/2021 0411   EOSABS 0.0 09/06/2023 1137   BASOSABS 0.0 09/06/2023 1137   ANC count has been stable. No results found for: POCLITH, LITHIUM   02/23/22      Component Ref Range & Units 2 mo ago  Clozapine  Lvl 350 - 600 ng/mL 1,542 High   NorClozapine Not Estab. ng/mL 871   Total(Cloz+Norcloz) ng/mL 2,413      Latest Reference Range & Units 05/21/08 20:56 09/19/22 15:51 05/24/23 16:00 10/11/23 14:56  Iron 45 - 160 mcg/dL   43 (L) 897  TIBC 749 - 450 mcg/dL (calc)   685 630  %SAT 16 - 45 % (calc)   14 (L) 28  Ferritin 16 - 288 ng/mL 50 39.0 37 34  (L): Data is abnormally low  .res Assessment: Plan:    Nancy Brooks was seen today for follow-up, depression, anxiety and sleeping problem.  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   30 min appt.  We discussed her Chronic severe schizophrenic symptoms of paranoia, AH and negative symptoms.  She has not improved nor changed significantly since the last visit nor significantly since when she was first seen in a number of years ago.. Not able to function or self-care without the help of her sister.  Have adjusted the clozapine  levels up and down without much difference in her overall symptoms.  She is currently taking clozapine  500 mg daily as she did not improve at the higher dosages.   Chronically easily overwhelmed.  But not agitated.  Needs help from sister. Needs instructions repeated. Chronic voices and intrusive disturbing thoughts wihtout change.   Disc schizophrenia causes intrusive thoughts and diminishes emotional feels for others and she has suffered this.    ANC has remained consistently normal.  No SE except constipation disc .She has chronic constipation and it's better lately.. Still takes Miralax . It's possible clozapine  contributes to the problem.  Rec talk with PCP about any worsening constipation and diarrhea.  She is still taking Linzess .    Continue clozapine  500 mg daily.    ANC has been stable for clozapine  RX.  Disc risk of aplastic anemia. Disc compliance with her medications.   OK per FDA to reduce CBC to every other month.   Re: restlessness at night.  Normal iron studies.  Taking gabapentin  100 HS.  Managed.   We discussed the short-term risks associated with benzodiazepines including sedation and increased fall risk among others.  Discussed long-term side effect risk including dependence, potential withdrawal symptoms, and the potential eventual dose-related risk of dementia.  But recent studies from 2020 dispute this association between benzodiazepines and dementia risk. Newer studies in 2020 do not support an association with dementia. Her anxiety is quite severe  but not clear what change could help.   Tried switch from Librium  to clonazepam  . reduced to 0.5 mg BID bc lack of obvious benefit with higher dose.      consider Luvox bc obsessive thoughts, but  Concerns about polypharmacy and drug interactions so defer.  Would like a trial of this but concerns she can't tolerate a reduction in clozapine  which would likely be necessary. Or Consider sertraline for intrusive thoughts.   Also new data suggests improved antipsychotic effects from SSRIs added to clozapine . But derfer bc change is difficult for her.  Counseling around guilt related issues.  Disc driving issues.  She is chronically anxious and that affects driving confidence.  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  Was medically reviewed by [facility] and given renewed license.  02/23/22      Component Ref Range & Units  2 mo ago  Clozapine  Lvl 350 - 600 ng/mL 1,542 High   NorClozapine Not Estab. ng/mL 871  Total(Cloz+Norcloz) ng/mL 2,413        After reviewing the blood level and thinking about the situation, I have decided that it is too risky medically to try the fluvoxamine augmentation that I had been considering.  Therefore continue the current medications and I do  not want to make any changes.          Clarita' # [phone].    Saw Dr. Rollene about change in skin color of face.  No medical concerns raised.   Also saw Neurology 2025.  DX low b12 and B1 and now on supplement.     FU every 8 weeks  Lorene Macintosh, MD, DFAPA   Please see After Visit Summary for patient specific instructions.  Future Appointments  Date Time Provider Department Center  04/17/2024  2:30 PM Leigh Venetia CROME, MD [facility] None       No orders of the defined types were placed in this encounter.      -------------------------------

## 2023-12-27 ENCOUNTER — Other Ambulatory Visit: Payer: Self-pay

## 2023-12-27 DIAGNOSIS — Z79899 Other long term (current) drug therapy: Secondary | ICD-10-CM

## 2024-01-29 ENCOUNTER — Other Ambulatory Visit: Payer: Self-pay | Admitting: Internal Medicine

## 2024-02-23 ENCOUNTER — Encounter: Payer: Self-pay | Admitting: Psychiatry

## 2024-02-27 ENCOUNTER — Ambulatory Visit: Payer: Self-pay | Admitting: Psychiatry

## 2024-03-05 ENCOUNTER — Encounter: Payer: Self-pay | Admitting: Psychiatry

## 2024-03-05 ENCOUNTER — Ambulatory Visit: Admitting: Psychiatry

## 2024-03-05 DIAGNOSIS — E538 Deficiency of other specified B group vitamins: Secondary | ICD-10-CM | POA: Diagnosis not present

## 2024-03-05 DIAGNOSIS — F200 Paranoid schizophrenia: Secondary | ICD-10-CM | POA: Diagnosis not present

## 2024-03-05 DIAGNOSIS — F411 Generalized anxiety disorder: Secondary | ICD-10-CM | POA: Diagnosis not present

## 2024-03-05 DIAGNOSIS — Z79899 Other long term (current) drug therapy: Secondary | ICD-10-CM

## 2024-03-05 DIAGNOSIS — G2581 Restless legs syndrome: Secondary | ICD-10-CM | POA: Diagnosis not present

## 2024-03-05 DIAGNOSIS — K5903 Drug induced constipation: Secondary | ICD-10-CM

## 2024-03-05 NOTE — Patient Instructions (Signed)
Nancy Brooks,

## 2024-03-05 NOTE — Progress Notes (Signed)
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 exhibits impaired recent memory.     Comments: No change in following:  Odd facial grimacing with intrusive thoughts.  Chronic stereotypic and repetitive gestures especially when walking.  Chronically anxious and poor social skills but cooperative.  Not much dep.   Rigid. 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 Poor insight and judgment fair. Obsesses on weight.  Intrusive thoughts religious and sexual nature disturb.  Seeks reassurance.   Affect chronically distressed. Reduced concentration stable.    Lab Review:     Component Value Date/Time   NA 142 09/19/2022 1551   K 4.3 09/19/2022 1551   CL 102 09/19/2022 1551   CO2 31 09/19/2022 1551   GLUCOSE 95 09/19/2022 1551   BUN 25 (H) 09/19/2022 1551   CREATININE 0.93 09/19/2022 1551   CALCIUM  9.0 09/19/2022 1551   PROT 6.4 09/19/2022 1551   ALBUMIN 4.2 09/19/2022 1551   AST 20 09/19/2022 1551   ALT 22 09/19/2022 1551   ALKPHOS 95 09/19/2022 1551   BILITOT 0.3 09/19/2022 1551   GFRNONAA >60 01/30/2021 0411       Component Value Date/Time   WBC 6.3 09/06/2023 1137   WBC 7.2 08/03/2021 1458   RBC 4.50 09/06/2023 1137   RBC 4.23 08/03/2021 1458   HGB 12.5 09/06/2023 1137   HCT 38.8 09/06/2023 1137    PLT 207 09/06/2023 1137   MCV 86 09/06/2023 1137   MCH 27.8 09/06/2023 1137   MCH 28.8 02/02/2021 0458   MCHC 32.2  09/06/2023 1137   MCHC 33.6 08/03/2021 1458   RDW 14.4 09/06/2023 1137   LYMPHSABS 1.5 09/06/2023 1137   MONOABS 0.2 01/30/2021 0411   EOSABS 0.0 09/06/2023 1137   BASOSABS 0.0 09/06/2023 1137   ANC count has been stable. No results found for: POCLITH, LITHIUM   02/23/22      Component Ref Range & Units 2 mo ago  Clozapine  Lvl 350 - 600 ng/mL 1,542 High   NorClozapine Not Estab. ng/mL 871  Total(Cloz+Norcloz) ng/mL 2,413      Latest Reference Range & Units 05/21/08 20:56 09/19/22 15:51 05/24/23 16:00 10/11/23 14:56  Iron 45 - 160 mcg/dL   43 (L) 897  TIBC 749 - 450 mcg/dL (calc)   685 630  %SAT 16 - 45 % (calc)   14 (L) 28  Ferritin 16 - 288 ng/mL 50 39.0 37 34  (L): Data is abnormally low  .res Assessment: Plan:    Aireal was seen today for follow-up, hallucinations and anxiety.  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   30 min appt.  We discussed her Chronic severe schizophrenic symptoms of paranoia, AH and negative symptoms.  She has not improved nor changed significantly since the last visit nor significantly since when she was first seen in a number of years ago.. Not able to function or self-care without the help of her sister.  Have adjusted the clozapine  levels up and down without much difference in her overall symptoms.  She is currently taking clozapine  500 mg daily as she did not improve at the higher dosages.   Chronically easily overwhelmed.  But not agitated.  Needs help from sister. Needs instructions repeated. Chronic voices and intrusive disturbing thoughts wihtout change.   Disc schizophrenia causes intrusive  thoughts and diminishes emotional feels for others and she has suffered this.    ANC has remained consistently normal.  No SE except constipation disc .She has chronic constipation and it's better lately.. Still takes Miralax . It's possible clozapine  contributes to the problem.  Rec talk with PCP about any worsening constipation and diarrhea.  She is still taking Linzess .   Continue clozapine  500 mg daily.    ANC has been stable for clozapine  RX.  Disc risk of aplastic anemia. Disc compliance with her medications.   OK per FDA to stop CBC routine.SABRA   Re: restlessness at night.  Normal iron studies.  Taking gabapentin  100 HS.  Managed.   We discussed the short-term risks associated with benzodiazepines including sedation and increased fall risk among others.  Discussed long-term side effect risk including dependence, potential withdrawal symptoms, and the potential eventual dose-related risk of dementia.  But recent studies from 2020 dispute this association between benzodiazepines and dementia risk. Newer studies in 2020 do not support an association with dementia. Her anxiety is quite severe  but not clear what change could help.   Tried switch from Librium  to clonazepam  . reduced to 0.5 mg BID bc lack of obvious benefit with higher dose.      consider Luvox bc obsessive thoughts, but  Concerns about polypharmacy and drug interactions so defer.  Would like a trial of this but concerns she can't tolerate a reduction in clozapine  which would likely be necessary. Or Consider sertraline for intrusive thoughts.   Also new  data suggests improved antipsychotic effects from SSRIs added to clozapine . But derfer bc change is difficult for her.  Counseling around guilt related issues.  Disc driving issues.  She is chronically anxious and that affects driving confidence.  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  Was medically reviewed by [facility] and given renewed license.  02/23/22      Component Ref Range & Units  2 mo ago  Clozapine  Lvl 350 - 600 ng/mL 1,542 High   NorClozapine Not Estab. ng/mL 871  Total(Cloz+Norcloz) ng/mL 2,413        After reviewing the blood level and thinking about the situation, I have decided that it is too risky medically to try the fluvoxamine augmentation that I had been considering.  Therefore continue the current medications and I do not want to make any changes.          Nancy Brooks' # [phone].    Saw Dr. Rollene about change in skin color of face.  No medical concerns raised.   Also saw Neurology 2025.  DX low b12 and B1 and now on supplement.     FU every 8 weeks  Lorene Macintosh, MD, DFAPA   Please see After Visit Summary for patient specific instructions.  Future Appointments  Date Time Provider Department Center  04/17/2024  2:30 PM Leigh Venetia CROME, MD [facility] None       No orders of the defined types were placed in this encounter.      -------------------------------

## 2024-03-14 ENCOUNTER — Other Ambulatory Visit: Payer: Self-pay | Admitting: Physician Assistant

## 2024-04-08 NOTE — Progress Notes (Unsigned)
 I saw Nancy Brooks in neurology clinic on 04/17/24 in follow up for imbalance and falls.  HPI: Nancy Brooks is a 73 y.o. year old female with a history of HLD, schizophrenia who we last saw on 10/11/23.  To briefly review: 05/24/23: Per sister, patient does not pick up her feet for about 3 years (right more than left). This fall she had more trouble walking. She has had to be helped walking. She has fallen multiple times, maybe a couple of times per month per patient. She can have difficulty getting out of a bathtub. She has burning, tingling in her legs. It can also go into her torso but not her arms. She denies neck pain but did have back pain in the past. The weakness in the legs seems to be getting worse.    She denies freezing. She denies tremor. She denies REM sleep disorder. Her schizophrenia has been stable for a long time.   Sister is worried because patient has a family history of PSP (mother) and MS (maternal cousin and uncle).   Patient takes ropinirole  for possible restless leg syndrome. She denies that she wants to move her legs at night.   Patient lives alone.    She does not report any constitutional symptoms like fever, night sweats, anorexia or unintentional weight loss.   EtOH use: None  Restrictive diet? No  10/11/23: B1, B6, and iron were low. I recommended supplementing these. Per sister she is taking these. She went to physical therapy. Patient says she did well. Patient still has difficulty with history. She states she feels about the same. Her sister states she is falling less, with only 2 falls since the last visit. Patient has difficulty explaining why she falls.    She eats okay per patient and her sister. She does not drink a lot of water though.   Her BP is very low today with elevated HR (72/49, 107).  Most recent Assessment and Plan (10/11/23): This is Nancy Brooks, a 73 y.o. female with falls. Her prior work up was significant for B1 deficiency, B6  deficiency, and iron deficiency. She also had a prior history of B12 deficiency. She is now on supplements for all the above and may have had some mild improvement and less falls. I am concerned about her nutritional status due to these abnormalities despite being told that she eats normally. She also has very low BP and tachycardia suggesting possible dehydration. She does endorse poor water intake. I still have questions about potential parkinsonism, perhaps drug induced, but this is still unclear. I will continue to monitor and see if patient continues to improve.   Plan: -Blood work: B1, B6, B12, iron studies -Continue home PT exercises -Fall precautions -Will discuss low BP with PCP -Discussed the importance of good nutrition and hydration with plenty of water each day  Since their last visit: Labs showed B1, B6, and B12 were now normal to mildly elevated. Ferritin was still low.  Patient feels like she is getting around okay. She is not sure if she has fallen recently. Her sister agrees that she is doing better. She has had a couple of trips/stumbles, but nothing significant. She thinks the tingling in her feet is better. She continues to take gabapentin  100 mg at bedtime.  She went to an orthopedic for hip pain into leg. She was given meloxicam  that has helped this.  She has no new complaints.   MEDICATIONS:  Outpatient Encounter Medications  as of 04/17/2024  Medication Sig   calcium  elemental as carbonate (BARIATRIC TUMS ULTRA) 400 MG chewable tablet Chew 1,000 mg by mouth daily.   cholecalciferol (VITAMIN D3) 25 MCG (1000 UNIT) tablet Take 1,000 Units by mouth daily.   clonazePAM  (KLONOPIN ) 0.5 MG tablet 1 tablet in the AM and 1 at suppertime   cloZAPine  (CLOZARIL ) 100 MG tablet TAKE 1 TABLET BY MOUTH EVERY MORNING AND 4 TABLETS EVERY EVENING   cyanocobalamin  (VITAMIN B12) 1000 MCG tablet Take 1 tablet (1,000 mcg total) by mouth daily.   famotidine  (PEPCID ) 10 MG tablet Take 10 mg  by mouth daily.    gabapentin  (NEURONTIN ) 100 MG capsule Take 1 capsule (100 mg total) by mouth at bedtime.   LINZESS  72 MCG capsule Take 1 capsule (72 mcg total) by mouth daily.   polyethylene glycol (MIRALAX  / GLYCOLAX ) 17 g packet Take 17 g by mouth daily.   PREVIDENT 1.1 % GEL dental gel USE AS DIRECTED   simvastatin  (ZOCOR ) 40 MG tablet TAKE 1 TABLET(40 MG) BY MOUTH DAILY AT 6 PM   No facility-administered encounter medications on file as of 04/17/2024.    PAST MEDICAL HISTORY: Past Medical History:  Diagnosis Date   Anxiety    BURSITIS, RIGHT KNEE    Depression    Dyskinesia of esophagus    GAIT DISTURBANCE    GERD    HEMORRHOIDS, NOS    HYPERLIPIDEMIA    MENOPAUSAL SYNDROME    Osteoporosis dx 03/2011   DEXA at gyn, started boniva  in additon to Ca+D, changed to Prolia  09/2012   SCHIZOPHRENIA    SYMPTOM, INCONTINENCE, URGE     PAST SURGICAL HISTORY: Past Surgical History:  Procedure Laterality Date   ARTHRODESIS METATARSAL Left 01/29/2021   Procedure: ARTHRODESIS TARSOMETATARSAL LEFT FOOT;  Surgeon: Silva Juliene SAUNDERS, DPM;  Location: [facility];  Service: Podiatry;  Laterality: Left;  GENERAL WITH BLOCK   BREAST SURGERY  05/09/1998   left breast biopsy   CLOSED REDUCTION METATARSAL Left 12/11/2020   Procedure: CLOSED REDUCTION METATARSAL;  Surgeon: Silva Juliene SAUNDERS, DPM;  Location: [facility];  Service: Podiatry;  Laterality: Left;  reduction and pinning (2.29mm Steinmann pins)   COLONOSCOPY  03/16/2010   also 2017    ALLERGIES: No Known Allergies  FAMILY HISTORY: Family History  Problem Relation Age of Onset   Hypertension Mother    Depression Mother    Prostate cancer Father    Colon cancer Brother 23   Esophageal cancer Neg Hx    Pancreatic cancer Neg Hx    Rectal cancer Neg Hx    Stomach cancer Neg Hx     SOCIAL HISTORY: Social History   Tobacco Use   Smoking status: Never   Smokeless tobacco: Never  Vaping Use   Vaping status: Never Used  Substance Use  Topics   Alcohol use: No    Alcohol/week: 0.0 standard drinks of alcohol   Drug use: No   Social History   Social History Narrative   Lives alone in single family home. Sister provides support and come to visits. Pt drives and indep ADLs.    Are you right handed or left handed? Right   Are you currently employed ?    What is your current occupation? none   Do you live at home alone? yes   Who lives with you?    What type of home do you live in: 1 story or 2 story? one    Caffeine 2 cups of tea  Objective:  Vital Signs:  BP 92/65   Pulse (!) 114   Ht 5' 1.5 (1.562 m)   Wt 129 lb (58.5 kg)   SpO2 96%   BMI 23.98 kg/m   General: General appearance: Awake and alert. No distress. Cooperative with exam.  Skin: Nancy Brooks discoloration of face HEENT: Atraumatic. Anicteric. Lungs: Non-labored breathing on room air  Heart: Regular Extremities: No edema.  Affect: flat  Neurological: Mental Status: Alert. Speech fluent. No pseudobulbar affect Cranial Nerves: CNII: No RAPD. Visual fields intact. CNIII, IV, VI: PERRL. No nystagmus. EOMI. CN V: Facial sensation intact bilaterally to fine touch. CN VII: Facial muscles symmetric and strong. No ptosis at rest. CN VIII: Hears finger rub well bilaterally. CN IX: No hypophonia. CN X: Palate elevates symmetrically. CN XI: Full strength shoulder shrug bilaterally. CN XII: Tongue protrusion full and midline. No atrophy or fasciculations. No significant dysarthria Motor: Tone is normal. Strength is 5/5 in bilateral upper and lower extremities. Reflexes:  Right Left  Bicep 2+ 2+  Tricep 2+ 2+  BrRad 2+ 2+  Knee 2+ 2+  Ankle 0 0   Sensation: Vibration intact in all extremities Coordination: Intact finger-to- nose-finger bilaterally. Romberg negative. No clear bradykinesia. Gait: Normal, narrow based gait.   Lab and Test Review: New results: CBC w/ diff (02/22/24 external) unremarkable  10/11/23: Ferritin 34 B6 mildly elevated  to 25.6 B12: 768 B1: elevated to 199  Previously reviewed results: 05/24/23: IFE: no M protein B1 < 6 B6 < 2 Ferritin 37, iron sat % 14   09/19/22: LDL 87 TSH wnl B12: > 1500 Ferritin 39.0 CMP unremarkable   HbA1c (03/31/20): 5.6   External labs: CBC w/ diff (04/19/23) unremarkable   Imaging: Lumbar spine xray (03/23/23): FINDINGS: Five non-rib-bearing lumbar vertebra. Slight straightening of normal lordosis. Trace anterolisthesis of L4 on L5. Normal vertebral body heights. No fracture or compression deformity. Minor anterior spurring at multiple levels, mild L5-S1 disc space narrowing. Mild multilevel facet hypertrophy. No evidence of pars defects or focal bone abnormality.   IMPRESSION: 1. Mild multilevel degenerative disc disease and facet hypertrophy. 2. Trace anterolisthesis of L4 on L5.  ASSESSMENT: This is ELZENA MUSTON, a 73 y.o. female with imbalance and falls. Her prior work up was significant for B1 deficiency, B6 deficiency, and iron deficiency. She also had a prior history of B12 deficiency. She is now on supplements for all the above with improvement of all these levels, though ferritin is still low. She continues to improve with better ambulation and less falls which are very infrequent now. She still has lower BP and tachycardia concerning for poor hydration status. There is no clear parkinsonism today, which I continue to monitor for.  Plan: -Continue B complex -Continue B12 1000 mcg daily -Continue ferrous sulfate 325 mg daily. Take with vitamin C 100-200 mg. -Continue gabapentin  100 mg at bedtime -Discussed importance of hydration and good nutrition -Fall precautions discussed  Return to clinic in 1 year or sooner if needed  Total time spent reviewing records, interview, history/exam, documentation, and coordination of care on day of encounter:  35 min  Venetia Potters, MD

## 2024-04-17 ENCOUNTER — Ambulatory Visit: Admitting: Neurology

## 2024-04-17 ENCOUNTER — Encounter: Payer: Self-pay | Admitting: Neurology

## 2024-04-17 VITALS — BP 92/65 | HR 114 | Ht 61.5 in | Wt 129.0 lb

## 2024-04-17 DIAGNOSIS — E531 Pyridoxine deficiency: Secondary | ICD-10-CM | POA: Diagnosis not present

## 2024-04-17 DIAGNOSIS — E538 Deficiency of other specified B group vitamins: Secondary | ICD-10-CM | POA: Diagnosis not present

## 2024-04-17 DIAGNOSIS — E611 Iron deficiency: Secondary | ICD-10-CM

## 2024-04-17 DIAGNOSIS — R209 Unspecified disturbances of skin sensation: Secondary | ICD-10-CM

## 2024-04-17 DIAGNOSIS — R269 Unspecified abnormalities of gait and mobility: Secondary | ICD-10-CM | POA: Diagnosis not present

## 2024-04-17 DIAGNOSIS — E519 Thiamine deficiency, unspecified: Secondary | ICD-10-CM | POA: Diagnosis not present

## 2024-04-17 NOTE — Patient Instructions (Addendum)
 -Continue B complex -Continue B12 1000 mcg daily -Continue ferrous sulfate 325 mg daily. Take with vitamin C 100-200 mg. -Continue gabapentin  100 mg at bedtime  I encourage you to drink more water to stay hydrated and do not skip meals.  The physicians and staff at [facility] are committed to providing excellent care. You may receive a survey requesting feedback about your experience at our office. We strive to receive very good responses to the survey questions. If you feel that your experience would prevent you from giving the office a very good  response, please contact our office to try to remedy the situation. We may be reached at [phone]. Thank you for taking the time out of your busy day to complete the survey.  Venetia Potters, MD [facility]  Preventing Falls at [facility] are common, often dreaded events in the lives of older people. Aside from the obvious injuries and even death that may result, fall can cause wide-ranging consequences including loss of independence, mental decline, decreased activity and mobility. Younger people are also at risk of falling, especially those with chronic illnesses and fatigue.  Ways to reduce risk for falling Examine diet and medications. Warm foods and alcohol dilate blood vessels, which can lead to dizziness when standing. Sleep aids, antidepressants and pain medications can also increase the likelihood of a fall.  Get a vision exam. Poor vision, cataracts and glaucoma increase the chances of falling.  Check foot gear. Shoes should fit snugly and have a sturdy, nonskid sole and a broad, low heel  Participate in a physician-approved exercise program to build and maintain muscle strength and improve balance and coordination. Programs that use ankle weights or stretch bands are excellent for muscle-strengthening. Water aerobics programs and low-impact Tai Chi programs have also been shown to improve balance and  coordination.  Increase vitamin D  intake. Vitamin D  improves muscle strength and increases the amount of calcium  the body is able to absorb and deposit in bones.  How to prevent falls from common hazards Floors - Remove all loose wires, cords, and throw rugs. Minimize clutter. Make sure rugs are anchored and smooth. Keep furniture in its usual place.  Chairs -- Use chairs with straight backs, armrests and firm seats. Add firm cushions to existing pieces to add height.  Bathroom - Install grab bars and non-skid tape in the tub or shower. Use a bathtub transfer bench or a shower chair with a back support Use an elevated toilet seat and/or safety rails to assist standing from a low surface. Do not use towel racks or bathroom tissue holders to help you stand.  Lighting - Make sure halls, stairways, and entrances are well-lit. Install a night light in your bathroom or hallway. Make sure there is a light switch at the top and bottom of the staircase. Turn lights on if you get up in the middle of the night. Make sure lamps or light switches are within reach of the bed if you have to get up during the night.  Kitchen - Install non-skid rubber mats near the sink and stove. Clean spills immediately. Store frequently used utensils, pots, pans between waist and eye level. This helps prevent reaching and bending. Sit when getting things out of lower cupboards.  Living room/ Bedrooms - Place furniture with wide spaces in between, giving enough room to move around. Establish a route through the living room that gives you something to hold onto as you walk.  Stairs - Make sure treads,  rails, and rugs are secure. Install a rail on both sides of the stairs. If stairs are a threat, it might be helpful to arrange most of your activities on the lower level to reduce the number of times you must climb the stairs.  Entrances and doorways - Install metal handles on the walls adjacent to the doorknobs of all doors to make  it more secure as you travel through the doorway.  Tips for maintaining balance Keep at least one hand free at all times. Try using a backpack or fanny pack to hold things rather than carrying them in your hands. Never carry objects in both hands when walking as this interferes with keeping your balance.  Attempt to swing both arms from front to back while walking. This might require a conscious effort if Parkinson's disease has diminished your movement. It will, however, help you to maintain balance and posture, and reduce fatigue.  Consciously lift your feet off of the ground when walking. Shuffling and dragging of the feet is a common culprit in losing your balance.  When trying to navigate turns, use a U technique of facing forward and making a wide turn, rather than pivoting sharply.  Try to stand with your feet shoulder-length apart. When your feet are close together for any length of time, you increase your risk of losing your balance and falling.  Do one thing at a time. Don't try to walk and accomplish another task, such as reading or looking around. The decrease in your automatic reflexes complicates motor function, so the less distraction, the better.  Do not wear rubber or gripping soled shoes, they might catch on the floor and cause tripping.  Move slowly when changing positions. Use deliberate, concentrated movements and, if needed, use a grab bar or walking aid. Count 15 seconds between each movement. For example, when rising from a seated position, wait 15 seconds after standing to begin walking.  If balance is a continuous problem, you might want to consider a walking aid such as a cane, walking stick, or walker. Once you've mastered walking with help, you might be ready to try it on your own again.

## 2024-05-03 ENCOUNTER — Other Ambulatory Visit: Payer: Self-pay | Admitting: Internal Medicine

## 2024-05-07 ENCOUNTER — Encounter: Payer: Self-pay | Admitting: Psychiatry

## 2024-05-07 ENCOUNTER — Ambulatory Visit (INDEPENDENT_AMBULATORY_CARE_PROVIDER_SITE_OTHER): Admitting: Psychiatry

## 2024-05-07 DIAGNOSIS — K5903 Drug induced constipation: Secondary | ICD-10-CM | POA: Diagnosis not present

## 2024-05-07 DIAGNOSIS — E538 Deficiency of other specified B group vitamins: Secondary | ICD-10-CM

## 2024-05-07 DIAGNOSIS — F200 Paranoid schizophrenia: Secondary | ICD-10-CM | POA: Diagnosis not present

## 2024-05-07 DIAGNOSIS — G2581 Restless legs syndrome: Secondary | ICD-10-CM

## 2024-05-07 DIAGNOSIS — Z79899 Other long term (current) drug therapy: Secondary | ICD-10-CM | POA: Diagnosis not present

## 2024-05-07 DIAGNOSIS — F411 Generalized anxiety disorder: Secondary | ICD-10-CM | POA: Diagnosis not present

## 2024-05-07 NOTE — Progress Notes (Signed)
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 Medical History, Surgical history, Social history, and Family history were reviewed and updated as appropriate.   Please see review of systems for further details on the patient's review from today.   Objective:   Physical Exam:  There were no vitals taken for this visit.  Physical Exam Constitutional:      General: She is not in acute distress.    Appearance: She is well-developed.  Musculoskeletal:        General: No deformity.  Neurological:     Mental Status: She is alert and oriented to person, place, and time.     Motor: Tremor present.     Coordination: Coordination normal.     Gait: Gait abnormal.     Comments: Stereotypic compulsive style of walking with some retracing steps. No AIM noted.   Psychiatric:        Attention and Perception: Attention normal. She is attentive. She perceives auditory hallucinations. She does not perceive visual hallucinations.        Mood and Affect: Mood is anxious. Mood is not depressed. Affect is flat and inappropriate. Affect is not labile or angry.        Speech: Speech is not rapid and pressured, delayed or  slurred.  Behavior: Behavior is not agitated, slowed, aggressive or hyperactive. Behavior is cooperative.        Thought Content: Thought content is paranoid and delusional. Thought content does not include homicidal or suicidal ideation. Thought content does not include suicidal plan.        Cognition and Memory: Cognition is impaired. She exhibits impaired recent memory.     Comments: No change in following:  Odd facial grimacing with intrusive thoughts.  Chronic stereotypic and repetitive gestures especially when walking.  Chronically anxious and poor social skills but cooperative.  Not much dep.   Rigid. 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 Poor insight and judgment fair. Obsesses on weight.  Intrusive thoughts religious and sexual nature disturb.  Seeks reassurance.   Affect chronically distressed. Reduced concentration stable.  Easily confused.      Lab Review:     Component Value Date/Time   NA 142 09/19/2022 1551   K 4.3 09/19/2022 1551   CL 102 09/19/2022 1551   CO2 31 09/19/2022 1551   GLUCOSE 95 09/19/2022 1551   BUN 25 (H) 09/19/2022 1551   CREATININE 0.93 09/19/2022 1551   CALCIUM  9.0 09/19/2022 1551   PROT 6.4 09/19/2022 1551   ALBUMIN 4.2 09/19/2022 1551   AST 20 09/19/2022 1551   ALT 22 09/19/2022 1551   ALKPHOS 95 09/19/2022 1551   BILITOT 0.3 09/19/2022 1551   GFRNONAA >60 01/30/2021 0411       Component Value Date/Time   WBC 6.3 09/06/2023 1137   WBC 7.2 08/03/2021 1458   RBC 4.50 09/06/2023 1137   RBC 4.23 08/03/2021 1458   HGB 12.5 09/06/2023 1137   HCT 38.8 09/06/2023 1137   PLT 207 09/06/2023 1137   MCV 86 09/06/2023 1137   MCH 27.8 09/06/2023 1137   MCH 28.8 02/02/2021 0458   MCHC 32.2 09/06/2023 1137   MCHC 33.6 08/03/2021 1458   RDW 14.4 09/06/2023 1137   LYMPHSABS 1.5 09/06/2023 1137   MONOABS 0.2 01/30/2021 0411   EOSABS 0.0 09/06/2023 1137   BASOSABS 0.0 09/06/2023 1137   ANC count has been stable. No results  found for: POCLITH, LITHIUM   02/23/22      Component Ref Range & Units 2 mo ago  Clozapine  Lvl 350 - 600 ng/mL 1,542 High   NorClozapine Not Estab. ng/mL 871  Total(Cloz+Norcloz) ng/mL 2,413      Latest Reference Range & Units 05/21/08 20:56 09/19/22 15:51 05/24/23 16:00 10/11/23 14:56  Iron 45 - 160 mcg/dL   43 (L) 897  TIBC 749 - 450 mcg/dL (calc)   685 630  %SAT 16 - 45 % (calc)   14 (L) 28  Ferritin 16 - 288 ng/mL 50 39.0 37 34  (L): Data is abnormally low  .res Assessment: Plan:    Lilliann was seen today for follow-up, depression, anxiety, hallucinations and paranoid.  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   30 min appt.  We discussed her Chronic severe schizophrenic symptoms of paranoia, AH and negative symptoms.  She has not improved nor changed significantly since the last visit nor significantly since when she was first seen in a number of years ago.. Not able to function or self-care without the help of her sister.  Have adjusted the clozapine  levels up and down without  much difference in her overall symptoms.  She is currently taking clozapine  500 mg daily as she did not improve at the higher dosages.   Chronically easily overwhelmed.  But not agitated.  Needs help from sister. Needs instructions repeated. Chronic voices and intrusive disturbing thoughts wihtout change.   Disc schizophrenia causes intrusive thoughts and diminishes emotional feels for others and she has suffered this.    ANC has remained consistently normal.  No SE except constipation disc .She has chronic constipation and it's better lately.. Still takes Miralax . It's possible clozapine  contributes to the problem.  Rec talk with PCP about any worsening constipation and  diarrhea.  She is still taking Linzess .   Continue clozapine  500 mg daily.    OK per FDA to stop CBC routine..  she is glad to have stopped it.    Re: restlessness at night.  Normal iron studies.  Taking gabapentin  100 HS.  Managed.   We discussed the short-term risks associated with benzodiazepines including sedation and increased fall risk among others.  Discussed long-term side effect risk including dependence, potential withdrawal symptoms, and the potential eventual dose-related risk of dementia.  But recent studies from 2020 dispute this association between benzodiazepines and dementia risk. Newer studies in 2020 do not support an association with dementia. Her anxiety is quite severe  but not clear what change could help.   Tried switch from Librium  to clonazepam  . reduced to 0.5 mg BID bc lack of obvious benefit with higher dose.      consider Luvox bc obsessive thoughts, but  Concerns about polypharmacy and drug interactions so defer.  Would like a trial of this but concerns she can't tolerate a reduction in clozapine  which would likely be necessary. Or Consider sertraline for intrusive thoughts.   Also new data suggests improved antipsychotic effects from SSRIs added to clozapine . But derfer bc change is difficult for her.  Counseling around guilt related issues.  Disc driving issues.  She is chronically anxious and that affects driving confidence.  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  Was medically reviewed by [facility] and given renewed license.  02/23/22      Component Ref Range & Units 2 mo ago  Clozapine  Lvl 350 - 600 ng/mL 1,542 High   NorClozapine Not Estab. ng/mL 871  Total(Cloz+Norcloz) ng/mL 2,413        After reviewing the blood level and thinking about the situation, I have decided that it is too risky medically to try the fluvoxamine augmentation that I had been considering.  Therefore continue the current medications and I do not want to make any changes.          Clarita'  # [phone].    Saw Dr. Rollene about change in skin color of face.  No medical concerns raised.   Also saw Neurology 2025.  DX low b12 and B1 and now on supplement.  Compliant with this and vit D. Encourage continue walking for health.  She takes frequent breaks but walks several times.  No med changes: clonazepam  0.5 mg BID, clozapine  500 mg HS, gabapentin  100 mg HS.   FU every 8 weeks  Lorene Macintosh, MD, DFAPA   Please see After Visit Summary for patient specific instructions.  Future Appointments  Date Time Provider Department Center  04/17/2025  2:30 PM Leigh Venetia CROME, MD [facility] None       No orders of the defined types were placed in this encounter.      -------------------------------

## 2024-05-18 ENCOUNTER — Encounter: Payer: Self-pay | Admitting: Gastroenterology

## 2024-05-23 ENCOUNTER — Other Ambulatory Visit: Payer: Self-pay | Admitting: Psychiatry

## 2024-05-23 DIAGNOSIS — F200 Paranoid schizophrenia: Secondary | ICD-10-CM

## 2024-06-10 ENCOUNTER — Other Ambulatory Visit: Payer: Self-pay | Admitting: Neurology

## 2024-06-10 DIAGNOSIS — R209 Unspecified disturbances of skin sensation: Secondary | ICD-10-CM

## 2024-06-14 ENCOUNTER — Other Ambulatory Visit: Payer: Self-pay | Admitting: Physician Assistant

## 2024-07-02 ENCOUNTER — Ambulatory Visit: Admitting: Psychiatry

## 2024-10-08 ENCOUNTER — Ambulatory Visit

## 2025-04-17 ENCOUNTER — Ambulatory Visit: Admitting: Neurology
